# Patient Record
Sex: Female | Born: 1985 | Race: Black or African American | Hispanic: No | Marital: Married | Smoking: Former smoker
Health system: Southern US, Community
[De-identification: ages and names within clinical notes are randomized; demographics above are authoritative.]

## PROBLEM LIST (undated history)

## (undated) ENCOUNTER — Inpatient Hospital Stay (HOSPITAL_COMMUNITY): Payer: Self-pay

## (undated) DIAGNOSIS — Z3490 Encounter for supervision of normal pregnancy, unspecified, unspecified trimester: Principal | ICD-10-CM

## (undated) DIAGNOSIS — I10 Essential (primary) hypertension: Secondary | ICD-10-CM

## (undated) DIAGNOSIS — D649 Anemia, unspecified: Secondary | ICD-10-CM

## (undated) DIAGNOSIS — L709 Acne, unspecified: Secondary | ICD-10-CM

## (undated) DIAGNOSIS — F419 Anxiety disorder, unspecified: Secondary | ICD-10-CM

## (undated) DIAGNOSIS — O139 Gestational [pregnancy-induced] hypertension without significant proteinuria, unspecified trimester: Secondary | ICD-10-CM

## (undated) DIAGNOSIS — M797 Fibromyalgia: Secondary | ICD-10-CM

## (undated) DIAGNOSIS — R768 Other specified abnormal immunological findings in serum: Secondary | ICD-10-CM

## (undated) DIAGNOSIS — N390 Urinary tract infection, site not specified: Secondary | ICD-10-CM

## (undated) DIAGNOSIS — R7689 Other specified abnormal immunological findings in serum: Secondary | ICD-10-CM

## (undated) DIAGNOSIS — Z309 Encounter for contraceptive management, unspecified: Secondary | ICD-10-CM

## (undated) DIAGNOSIS — N83209 Unspecified ovarian cyst, unspecified side: Secondary | ICD-10-CM

## (undated) DIAGNOSIS — L509 Urticaria, unspecified: Secondary | ICD-10-CM

## (undated) HISTORY — DX: Acne, unspecified: L70.9

## (undated) HISTORY — DX: Fibromyalgia: M79.7

## (undated) HISTORY — DX: Urticaria, unspecified: L50.9

## (undated) HISTORY — PX: TONSILLECTOMY: SUR1361

## (undated) HISTORY — DX: Other specified abnormal immunological findings in serum: R76.89

## (undated) HISTORY — DX: Encounter for contraceptive management, unspecified: Z30.9

## (undated) HISTORY — DX: Anxiety disorder, unspecified: F41.9

## (undated) HISTORY — DX: Gestational (pregnancy-induced) hypertension without significant proteinuria, unspecified trimester: O13.9

## (undated) HISTORY — DX: Other specified abnormal immunological findings in serum: R76.8

## (undated) HISTORY — DX: Encounter for supervision of normal pregnancy, unspecified, unspecified trimester: Z34.90

---
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
## 2006-05-08 ENCOUNTER — Emergency Department (HOSPITAL_COMMUNITY): Admission: EM | Admit: 2006-05-08 | Discharge: 2006-05-08 | Payer: Self-pay | Admitting: Emergency Medicine

## 2006-05-11 ENCOUNTER — Emergency Department (HOSPITAL_COMMUNITY): Admission: EM | Admit: 2006-05-11 | Discharge: 2006-05-11 | Payer: Self-pay | Admitting: Emergency Medicine

## 2006-06-24 ENCOUNTER — Emergency Department (HOSPITAL_COMMUNITY): Admission: EM | Admit: 2006-06-24 | Discharge: 2006-06-24 | Payer: Self-pay | Admitting: Emergency Medicine

## 2006-12-08 ENCOUNTER — Emergency Department (HOSPITAL_COMMUNITY): Admission: EM | Admit: 2006-12-08 | Discharge: 2006-12-08 | Payer: Self-pay | Admitting: Emergency Medicine

## 2007-08-18 ENCOUNTER — Other Ambulatory Visit: Admission: RE | Admit: 2007-08-18 | Discharge: 2007-08-18 | Payer: Self-pay | Admitting: Obstetrics and Gynecology

## 2007-10-21 ENCOUNTER — Emergency Department (HOSPITAL_COMMUNITY): Admission: EM | Admit: 2007-10-21 | Discharge: 2007-10-21 | Payer: Self-pay | Admitting: Emergency Medicine

## 2007-12-15 ENCOUNTER — Inpatient Hospital Stay (HOSPITAL_COMMUNITY): Admission: AD | Admit: 2007-12-15 | Discharge: 2007-12-15 | Payer: Self-pay | Admitting: Obstetrics & Gynecology

## 2007-12-15 ENCOUNTER — Ambulatory Visit: Payer: Self-pay | Admitting: Obstetrics & Gynecology

## 2008-02-01 ENCOUNTER — Ambulatory Visit: Payer: Self-pay | Admitting: Family

## 2008-02-01 ENCOUNTER — Inpatient Hospital Stay (HOSPITAL_COMMUNITY): Admission: AD | Admit: 2008-02-01 | Discharge: 2008-02-03 | Payer: Self-pay | Admitting: Obstetrics & Gynecology

## 2010-02-21 ENCOUNTER — Inpatient Hospital Stay (HOSPITAL_COMMUNITY): Admission: AD | Admit: 2010-02-21 | Discharge: 2010-02-21 | Payer: Self-pay | Admitting: Obstetrics and Gynecology

## 2010-07-24 LAB — URIC ACID: Uric Acid, Serum: 5.7 mg/dL (ref 2.4–7.0)

## 2010-07-24 LAB — DIFFERENTIAL
Basophils Absolute: 0.1 10*3/uL (ref 0.0–0.1)
Eosinophils Absolute: 0.1 10*3/uL (ref 0.0–0.7)
Lymphocytes Relative: 23 % (ref 12–46)
Monocytes Relative: 6 % (ref 3–12)

## 2010-07-24 LAB — URINALYSIS, ROUTINE W REFLEX MICROSCOPIC
Hgb urine dipstick: NEGATIVE
Ketones, ur: NEGATIVE mg/dL
Nitrite: NEGATIVE
Protein, ur: NEGATIVE mg/dL
Specific Gravity, Urine: 1.015 (ref 1.005–1.030)
pH: 7 (ref 5.0–8.0)

## 2010-07-24 LAB — CBC
HCT: 35.5 % — ABNORMAL LOW (ref 36.0–46.0)
Hemoglobin: 12.1 g/dL (ref 12.0–15.0)
WBC: 10 10*3/uL (ref 4.0–10.5)

## 2010-07-24 LAB — COMPREHENSIVE METABOLIC PANEL
Albumin: 3.1 g/dL — ABNORMAL LOW (ref 3.5–5.2)
Alkaline Phosphatase: 79 U/L (ref 39–117)
CO2: 21 mEq/L (ref 19–32)
Calcium: 8.6 mg/dL (ref 8.4–10.5)
Creatinine, Ser: 0.82 mg/dL (ref 0.4–1.2)
Glucose, Bld: 81 mg/dL (ref 70–99)
Total Protein: 6.7 g/dL (ref 6.0–8.3)

## 2010-07-24 LAB — LACTATE DEHYDROGENASE: LDH: 158 U/L (ref 94–250)

## 2011-02-05 LAB — URINALYSIS, ROUTINE W REFLEX MICROSCOPIC
Glucose, UA: NEGATIVE
Nitrite: NEGATIVE
Protein, ur: NEGATIVE
Urobilinogen, UA: 0.2
pH: 7

## 2011-02-05 LAB — URINE MICROSCOPIC-ADD ON

## 2011-02-06 LAB — WET PREP, GENITAL: Trich, Wet Prep: NONE SEEN

## 2011-02-09 LAB — RPR: RPR Ser Ql: NONREACTIVE

## 2011-02-09 LAB — CBC
Hemoglobin: 9.6 — ABNORMAL LOW
MCV: 93.4
MCV: 94.4
Platelets: 113 — ABNORMAL LOW
RBC: 3.08 — ABNORMAL LOW
RBC: 3.54 — ABNORMAL LOW
WBC: 10.7 — ABNORMAL HIGH

## 2012-08-30 ENCOUNTER — Encounter

## 2012-09-01 ENCOUNTER — Encounter

## 2012-11-17 ENCOUNTER — Ambulatory Visit (INDEPENDENT_AMBULATORY_CARE_PROVIDER_SITE_OTHER): Admitting: Otolaryngology

## 2012-11-17 DIAGNOSIS — J343 Hypertrophy of nasal turbinates: Secondary | ICD-10-CM

## 2012-11-17 DIAGNOSIS — H6500 Acute serous otitis media, unspecified ear: Secondary | ICD-10-CM

## 2012-11-17 DIAGNOSIS — H902 Conductive hearing loss, unspecified: Secondary | ICD-10-CM

## 2012-11-17 DIAGNOSIS — J310 Chronic rhinitis: Secondary | ICD-10-CM

## 2012-12-22 ENCOUNTER — Ambulatory Visit (INDEPENDENT_AMBULATORY_CARE_PROVIDER_SITE_OTHER): Admitting: Otolaryngology

## 2012-12-24 ENCOUNTER — Encounter (HOSPITAL_COMMUNITY): Payer: Self-pay | Admitting: *Deleted

## 2012-12-24 ENCOUNTER — Emergency Department (HOSPITAL_COMMUNITY)
Admission: EM | Admit: 2012-12-24 | Discharge: 2012-12-24 | Disposition: A | Discharge status: Home / Self Care | Attending: Emergency Medicine | Admitting: Emergency Medicine

## 2012-12-24 DIAGNOSIS — T63461A Toxic effect of venom of wasps, accidental (unintentional), initial encounter: Secondary | ICD-10-CM | POA: Insufficient documentation

## 2012-12-24 DIAGNOSIS — Y92009 Unspecified place in unspecified non-institutional (private) residence as the place of occurrence of the external cause: Secondary | ICD-10-CM | POA: Insufficient documentation

## 2012-12-24 DIAGNOSIS — T6391XA Toxic effect of contact with unspecified venomous animal, accidental (unintentional), initial encounter: Secondary | ICD-10-CM | POA: Insufficient documentation

## 2012-12-24 DIAGNOSIS — Y93H2 Activity, gardening and landscaping: Secondary | ICD-10-CM | POA: Insufficient documentation

## 2012-12-24 MED ORDER — DIPHENHYDRAMINE HCL 25 MG PO CAPS: 25.0000 mg | ORAL_CAPSULE | Freq: Four times a day (QID) | ORAL | Status: DC | PRN

## 2012-12-24 MED ORDER — DIPHENHYDRAMINE HCL 25 MG PO CAPS
ORAL_CAPSULE | ORAL | Status: AC
  Filled 2012-12-24: qty 2

## 2012-12-24 MED ORDER — DIPHENHYDRAMINE HCL 25 MG PO CAPS: 50.0000 mg | ORAL_CAPSULE | Freq: Once | ORAL | Status: DC

## 2012-12-24 MED ORDER — IBUPROFEN 600 MG PO TABS: 600.0000 mg | ORAL_TABLET | Freq: Four times a day (QID) | ORAL | Status: DC | PRN

## 2012-12-24 NOTE — ED Notes (Addendum)
Pt was mowing the yard and felt something bite her on the back of her right calf. Pt states she swated the insect off and it fell to the ground.

## 2012-12-24 NOTE — ED Notes (Signed)
Benadryl given to Dominique Garza to dispense to pt for home use.

## 2012-12-28 NOTE — ED Provider Notes (Signed)
CSN: [id number]     Arrival date & time 12/24/12  2101 History     First MD Initiated Contact with Patient 12/24/12 2128     Chief Complaint  Patient presents with  . Insect Bite   (Consider location/radiation/quality/duration/timing/severity/associated sxs/prior Treatment) HPI Comments: KARAGAN LEHR is a 27 y.o. Female presenting with an insect bite to her right posterior calf while mowing the lawn yesterday.  She describes swatting the insect,  Did not see it but briefly saw a flash of yellow,  So suspects a bee or yellow jacket sting.  She has persistent pain and swelling but also itching at the site.  She has been scratching the site which gives temporary relief and has also used an ice pack but has persistent symptoms which has not progressed.  She denies shortness of breath, cough, radiation of pain and does not have a history of bee sting allergy.       The history is provided by the patient.    History reviewed. No pertinent past medical history. Past Surgical History  Procedure Laterality Date  . Tonsillectomy     History reviewed. No pertinent family history. History  Substance Use Topics  . Smoking status: Never Smoker   . Smokeless tobacco: Not on file  . Alcohol Use: Yes   OB History   Grav Para Term Preterm Abortions TAB SAB Ect Mult Living                 Review of Systems  Constitutional: Negative for fever and chills.  HENT: Negative for sore throat, facial swelling and trouble swallowing.   Respiratory: Negative for shortness of breath and wheezing.   Skin: Positive for wound. Negative for color change.  Neurological: Negative for numbness.    Allergies  Review of patient's allergies indicates no known allergies.  Home Medications   Current Outpatient Rx  Name  Route  Sig  Dispense  Refill  . diphenhydrAMINE (BENADRYL) 25 mg capsule   Oral   Take 1-2 capsules (25-50 mg total) by mouth every 6 (six) hours as needed for itching.   30 capsule    0   . ibuprofen (ADVIL,MOTRIN) 600 MG tablet   Oral   Take 1 tablet (600 mg total) by mouth every 6 (six) hours as needed for pain.   20 tablet   0    BP 138/76  Pulse 66  Temp(Src) 98.4 F (36.9 C) (Oral)  Resp 18  Ht 5' 4.5" (1.638 m)  Wt 210 lb (95.255 kg)  BMI 35.5 kg/m2  SpO2 100% Physical Exam  Constitutional: She appears well-developed and well-nourished. No distress.  HENT:  Head: Normocephalic.  Neck: Neck supple.  Cardiovascular: Normal rate.   Pulmonary/Chest: Effort normal. She has no wheezes.  Musculoskeletal: Normal range of motion. She exhibits no edema.  Skin: There is erythema.  Localized erythema and induration of 3 cm diameter surrounding small puncture,  No fb retained.  No red streaking,  Drainage or drainage.    ED Course   Procedures (including critical care time)  Labs Reviewed - No data to display No results found. 1. Insect sting, initial encounter     MDM  Exam c/w localized reaction to insect sting,  Suspect yellow jacket.  She was encouraged to continue with ice , elevation,  ibprofen and benadryl added.  Expect prn f/u.  The patient appears reasonably screened and/or stabilized for discharge and I doubt any other medical condition or other [facility] requiring further  screening, evaluation, or treatment in the ED at this time prior to discharge.   Burgess Amor, PA-C 12/29/12 1440

## 2012-12-29 NOTE — ED Provider Notes (Signed)
Medical screening examination/treatment/procedure(s) were performed by non-physician practitioner and as supervising physician I was immediately available for consultation/collaboration.  Donnetta Hutching, MD 12/29/12 [phone]

## 2013-07-19 ENCOUNTER — Ambulatory Visit: Admitting: Obstetrics & Gynecology

## 2013-07-20 ENCOUNTER — Ambulatory Visit: Admitting: Obstetrics & Gynecology

## 2013-07-24 ENCOUNTER — Encounter: Payer: Self-pay | Admitting: Obstetrics & Gynecology

## 2013-07-24 ENCOUNTER — Encounter (INDEPENDENT_AMBULATORY_CARE_PROVIDER_SITE_OTHER): Payer: Self-pay

## 2013-07-24 ENCOUNTER — Ambulatory Visit (INDEPENDENT_AMBULATORY_CARE_PROVIDER_SITE_OTHER): Admitting: Obstetrics & Gynecology

## 2013-07-24 VITALS — BP 120/80 | Ht 64.0 in | Wt 191.0 lb

## 2013-07-24 DIAGNOSIS — Z7251 High risk heterosexual behavior: Secondary | ICD-10-CM | POA: Insufficient documentation

## 2013-07-24 DIAGNOSIS — Z113 Encounter for screening for infections with a predominantly sexual mode of transmission: Secondary | ICD-10-CM

## 2013-07-24 NOTE — Addendum Note (Signed)
Addended by: Criss AlvinePULLIAM, Germain Koopmann G on: 07/24/2013 02:14 PM   Modules accepted: Orders

## 2013-07-24 NOTE — Progress Notes (Signed)
Subjective:     Patient ID: Dominique Garza, female   DOB: [birth year], 28 y.o.   MRN: [id number]  HPI Pt presents wanting to be checked for STDs. No specific issues or problems No discharge  Review of Systems Negative    Objective:   Physical Exam normal    Assessment:     STD check due to high risk sexual behavoir     Plan:     Check l;abs

## 2013-07-25 LAB — HIV ANTIBODY (ROUTINE TESTING W REFLEX): HIV: NONREACTIVE

## 2013-07-25 LAB — GC/CHLAMYDIA PROBE AMP
CT PROBE, AMP APTIMA: NEGATIVE
GC PROBE AMP APTIMA: NEGATIVE

## 2013-07-25 LAB — HEPATITIS C ANTIBODY: HCV Ab: NEGATIVE

## 2013-07-25 LAB — RPR

## 2013-07-25 LAB — HSV 2 ANTIBODY, IGG: HSV 2 GLYCOPROTEIN G AB, IGG: 7.92 IV — AB

## 2013-07-31 ENCOUNTER — Telehealth: Payer: Self-pay | Admitting: Obstetrics and Gynecology

## 2013-08-03 ENCOUNTER — Telehealth: Payer: Self-pay | Admitting: Obstetrics and Gynecology

## 2013-08-03 NOTE — Telephone Encounter (Signed)
Pt informed (labs from 07/24/2012) HSV 2 positive, all other test results negative. Pt states had positive pregnancy test call transferred to front staff for an appt.

## 2013-08-04 ENCOUNTER — Encounter: Payer: Self-pay | Admitting: Adult Health

## 2013-08-04 ENCOUNTER — Ambulatory Visit (INDEPENDENT_AMBULATORY_CARE_PROVIDER_SITE_OTHER): Admitting: Adult Health

## 2013-08-04 ENCOUNTER — Telehealth: Payer: Self-pay | Admitting: Adult Health

## 2013-08-04 VITALS — BP 124/80 | Ht 64.0 in | Wt 190.0 lb

## 2013-08-04 DIAGNOSIS — Z3201 Encounter for pregnancy test, result positive: Secondary | ICD-10-CM

## 2013-08-04 LAB — POCT URINE PREGNANCY: Preg Test, Ur: POSITIVE

## 2013-08-04 LAB — HCG, QUANTITATIVE, PREGNANCY: hCG, Beta Chain, Quant, S: 30.5 m[IU]/mL

## 2013-08-04 NOTE — Telephone Encounter (Signed)
Pt aware QHCG 30.5 will repeat Monday 3/30 at 10 am

## 2013-08-04 NOTE — Progress Notes (Signed)
Patient ID: Dominique SalesJanay J Polito, female   DOB: 28-Dec-1985, 28 y.o.   MRN: [id number] Pt here for pregnancy test, resulted positive, LMP 07/24/2013 per patient. Pt c/o sharp right sided pain. Spoke with Cyril MourningJennifer Griffin, [facility] will f/u with pt later today.

## 2013-08-07 ENCOUNTER — Other Ambulatory Visit

## 2013-08-07 DIAGNOSIS — Z3202 Encounter for pregnancy test, result negative: Secondary | ICD-10-CM

## 2013-08-08 ENCOUNTER — Telehealth: Payer: Self-pay | Admitting: Obstetrics and Gynecology

## 2013-08-08 LAB — HCG, QUANTITATIVE, PREGNANCY: HCG, BETA CHAIN, QUANT, S: 31.2 m[IU]/mL

## 2013-08-08 NOTE — Telephone Encounter (Signed)
Pt informed of QHCG results of 31.2, pt informed QHCG did not double will discuss results with provider and contact pt with plan.

## 2013-08-08 NOTE — Telephone Encounter (Signed)
I called pt and she is aware of results and will have HCG re checked next week

## 2013-08-21 ENCOUNTER — Encounter: Payer: Self-pay | Admitting: Obstetrics & Gynecology

## 2013-08-21 ENCOUNTER — Other Ambulatory Visit

## 2013-08-21 ENCOUNTER — Ambulatory Visit (INDEPENDENT_AMBULATORY_CARE_PROVIDER_SITE_OTHER): Admitting: Obstetrics & Gynecology

## 2013-08-21 VITALS — BP 130/80 | Ht 64.2 in | Wt 185.0 lb

## 2013-08-21 DIAGNOSIS — Z3201 Encounter for pregnancy test, result positive: Secondary | ICD-10-CM

## 2013-08-21 DIAGNOSIS — O200 Threatened abortion: Secondary | ICD-10-CM | POA: Insufficient documentation

## 2013-08-21 NOTE — Progress Notes (Signed)
Patient ID: Dominique SalesJanay J Garza, female   DOB: 01/28/1986, 28 y.o.   MRN: [id number] Quantitative hcg done today Follow up based on lab work  Past Medical History  Diagnosis Date  . Fibromyalgia     Past Surgical History  Procedure Laterality Date  . Tonsillectomy      OB History   Grav Para Term Preterm Abortions TAB SAB Ect Mult Living                  No Known Allergies  History   Social History  . Marital Status: Married    Spouse Name: N/A    Number of Children: N/A  . Years of Education: N/A   Social History Main Topics  . Smoking status: Never Smoker   . Smokeless tobacco: Never Used  . Alcohol Use: Yes     Comment: occassionally  . Drug Use: No  . Sexual Activity: Yes   Other Topics Concern  . None   Social History Narrative  . None    Family History  Problem Relation Age of Onset  . Fibromyalgia Mother   . Sarcoidosis Mother   . Hypertension Sister   . Diabetes Paternal Grandmother

## 2013-08-22 LAB — HCG, QUANTITATIVE, PREGNANCY: hCG, Beta Chain, Quant, S: 4.5 m[IU]/mL

## 2013-08-23 ENCOUNTER — Telehealth: Payer: Self-pay | Admitting: Adult Health

## 2013-08-23 NOTE — Telephone Encounter (Signed)
Pt aware of results. And to make appointment when needed.

## 2014-02-16 ENCOUNTER — Ambulatory Visit (INDEPENDENT_AMBULATORY_CARE_PROVIDER_SITE_OTHER): Admitting: Adult Health

## 2014-02-16 ENCOUNTER — Encounter: Payer: Self-pay | Admitting: Adult Health

## 2014-02-16 VITALS — BP 134/90 | Ht 64.5 in | Wt 193.5 lb

## 2014-02-16 DIAGNOSIS — Z3201 Encounter for pregnancy test, result positive: Secondary | ICD-10-CM

## 2014-02-16 DIAGNOSIS — Z3490 Encounter for supervision of normal pregnancy, unspecified, unspecified trimester: Secondary | ICD-10-CM

## 2014-02-16 HISTORY — DX: Encounter for supervision of normal pregnancy, unspecified, unspecified trimester: Z34.90

## 2014-02-16 LAB — POCT URINE PREGNANCY: Preg Test, Ur: POSITIVE

## 2014-02-16 NOTE — Patient Instructions (Signed)
First Trimester of Pregnancy The first trimester of pregnancy is from week 1 until the end of week 12 (months 1 through 3). A week after a sperm fertilizes an egg, the egg will implant on the wall of the uterus. This embryo will begin to develop into a baby. Genes from you and your partner are forming the baby. The female genes determine whether the baby is a boy or a girl. At 6-8 weeks, the eyes and face are formed, and the heartbeat can be seen on ultrasound. At the end of 12 weeks, all the baby's organs are formed.  Now that you are pregnant, you will want to do everything you can to have a healthy baby. Two of the most important things are to get good prenatal care and to follow your health care provider's instructions. Prenatal care is all the medical care you receive before the baby's birth. This care will help prevent, find, and treat any problems during the pregnancy and childbirth. BODY CHANGES Your body goes through many changes during pregnancy. The changes vary from woman to woman.   You may gain or lose a couple of pounds at first.  You may feel sick to your stomach (nauseous) and throw up (vomit). If the vomiting is uncontrollable, call your health care provider.  You may tire easily.  You may develop headaches that can be relieved by medicines approved by your health care provider.  You may urinate more often. Painful urination may mean you have a bladder infection.  You may develop heartburn as a result of your pregnancy.  You may develop constipation because certain hormones are causing the muscles that push waste through your intestines to slow down.  You may develop hemorrhoids or swollen, bulging veins (varicose veins).  Your breasts may begin to grow larger and become tender. Your nipples may stick out more, and the tissue that surrounds them (areola) may become darker.  Your gums may bleed and may be sensitive to brushing and flossing.  Dark spots or blotches (chloasma,  mask of pregnancy) may develop on your face. This will likely fade after the baby is born.  Your menstrual periods will stop.  You may have a loss of appetite.  You may develop cravings for certain kinds of food.  You may have changes in your emotions from day to day, such as being excited to be pregnant or being concerned that something may go wrong with the pregnancy and baby.  You may have more vivid and strange dreams.  You may have changes in your hair. These can include thickening of your hair, rapid growth, and changes in texture. Some women also have hair loss during or after pregnancy, or hair that feels dry or thin. Your hair will most likely return to normal after your baby is born. WHAT TO EXPECT AT YOUR PRENATAL VISITS During a routine prenatal visit:  You will be weighed to make sure you and the baby are growing normally.  Your blood pressure will be taken.  Your abdomen will be measured to track your baby's growth.  The fetal heartbeat will be listened to starting around week 10 or 12 of your pregnancy.  Test results from any previous visits will be discussed. Your health care provider may ask you:  How you are feeling.  If you are feeling the baby move.  If you have had any abnormal symptoms, such as leaking fluid, bleeding, severe headaches, or abdominal cramping.  If you have any questions. Other tests   that may be performed during your first trimester include:  Blood tests to find your blood type and to check for the presence of any previous infections. They will also be used to check for low iron levels (anemia) and Rh antibodies. Later in the pregnancy, blood tests for diabetes will be done along with other tests if problems develop.  Urine tests to check for infections, diabetes, or protein in the urine.  An ultrasound to confirm the proper growth and development of the baby.  An amniocentesis to check for possible genetic problems.  Fetal screens for  spina bifida and Down syndrome.  You may need other tests to make sure you and the baby are doing well. HOME CARE INSTRUCTIONS  Medicines  Follow your health care provider's instructions regarding medicine use. Specific medicines may be either safe or unsafe to take during pregnancy.  Take your prenatal vitamins as directed.  If you develop constipation, try taking a stool softener if your health care provider approves. Diet  Eat regular, well-balanced meals. Choose a variety of foods, such as meat or vegetable-based protein, fish, milk and low-fat dairy products, vegetables, fruits, and whole grain breads and cereals. Your health care provider will help you determine the amount of weight gain that is right for you.  Avoid raw meat and uncooked cheese. These carry germs that can cause birth defects in the baby.  Eating four or five small meals rather than three large meals a day may help relieve nausea and vomiting. If you start to feel nauseous, eating a few soda crackers can be helpful. Drinking liquids between meals instead of during meals also seems to help nausea and vomiting.  If you develop constipation, eat more high-fiber foods, such as fresh vegetables or fruit and whole grains. Drink enough fluids to keep your urine clear or pale yellow. Activity and Exercise  Exercise only as directed by your health care provider. Exercising will help you:  Control your weight.  Stay in shape.  Be prepared for labor and delivery.  Experiencing pain or cramping in the lower abdomen or low back is a good sign that you should stop exercising. Check with your health care provider before continuing normal exercises.  Try to avoid standing for long periods of time. Move your legs often if you must stand in one place for a long time.  Avoid heavy lifting.  Wear low-heeled shoes, and practice good posture.  You may continue to have sex unless your health care provider directs you  otherwise. Relief of Pain or Discomfort  Wear a good support bra for breast tenderness.   Take warm sitz baths to soothe any pain or discomfort caused by hemorrhoids. Use hemorrhoid cream if your health care provider approves.   Rest with your legs elevated if you have leg cramps or low back pain.  If you develop varicose veins in your legs, wear support hose. Elevate your feet for 15 minutes, 3-4 times a day. Limit salt in your diet. Prenatal Care  Schedule your prenatal visits by the twelfth week of pregnancy. They are usually scheduled monthly at first, then more often in the last 2 months before delivery.  Write down your questions. Take them to your prenatal visits.  Keep all your prenatal visits as directed by your health care provider. Safety  Wear your seat belt at all times when driving.  Make a list of emergency phone numbers, including numbers for family, friends, the hospital, and police and fire departments. General Tips    Ask your health care provider for a referral to a local prenatal education class. Begin classes no later than at the beginning of month 6 of your pregnancy.  Ask for help if you have counseling or nutritional needs during pregnancy. Your health care provider can offer advice or refer you to specialists for help with various needs.  Do not use hot tubs, steam rooms, or saunas.  Do not douche or use tampons or scented sanitary pads.  Do not cross your legs for long periods of time.  Avoid cat litter boxes and soil used by cats. These carry germs that can cause birth defects in the baby and possibly loss of the fetus by miscarriage or stillbirth.  Avoid all smoking, herbs, alcohol, and medicines not prescribed by your health care provider. Chemicals in these affect the formation and growth of the baby.  Schedule a dentist appointment. At home, brush your teeth with a soft toothbrush and be gentle when you floss. SEEK MEDICAL CARE IF:   You have  dizziness.  You have mild pelvic cramps, pelvic pressure, or nagging pain in the abdominal area.  You have persistent nausea, vomiting, or diarrhea.  You have a bad smelling vaginal discharge.  You have pain with urination.  You notice increased swelling in your face, hands, legs, or ankles. SEEK IMMEDIATE MEDICAL CARE IF:   You have a fever.  You are leaking fluid from your vagina.  You have spotting or bleeding from your vagina.  You have severe abdominal cramping or pain.  You have rapid weight gain or loss.  You vomit blood or material that looks like coffee grounds.  You are exposed to [place] measles and have never had them.  You are exposed to fifth disease or chickenpox.  You develop a severe headache.  You have shortness of breath.  You have any kind of trauma, such as from a fall or a car accident. Document Released: 04/21/2001 Document Revised: 09/11/2013 Document Reviewed: 03/07/2013 [facility]CExitCare Patient Information 2015 [place], [place]. This information is not intended to replace advice given to you by your health care provider. Make sure you discuss any questions you have with your health care provider. Get medicaid Return in 1 week for dating [place]

## 2014-02-16 NOTE — Progress Notes (Signed)
Subjective:     Patient ID: Dominique Garza, female   DOB: 1986-03-19, 28 y.o.   MRN: [id number]  HPI Dominique Garza is a 28 year old black female in for UPT, no period since March.Complains of headache at times and back pain, no bleeding.  Review of Systems See HPI Reviewed past medical,surgical, social and family history. Reviewed medications and allergies.     Objective:   Physical Exam BP 134/90  Ht 5' 4.5" (1.638 m)  Wt 193 lb 8 oz (87.771 kg)  BMI 32.71 kg/m2  LMP 03/16/2015UPT+, US shows 2.3 cm fetal pole with +FHM so about 9 weeks with EDD 09/23/14, form given to get medicaid     Assessment:    Pregnant +UPT    Plan:    Increase water and try tylenol Return in 1 week for dating [place]  Get pregnancy medicaid Take prenatals   Review handout on first trimester

## 2014-02-22 ENCOUNTER — Other Ambulatory Visit: Payer: Self-pay | Admitting: Adult Health

## 2014-02-22 ENCOUNTER — Ambulatory Visit (INDEPENDENT_AMBULATORY_CARE_PROVIDER_SITE_OTHER)

## 2014-02-22 DIAGNOSIS — O26841 Uterine size-date discrepancy, first trimester: Secondary | ICD-10-CM

## 2014-02-22 DIAGNOSIS — Z3490 Encounter for supervision of normal pregnancy, unspecified, unspecified trimester: Secondary | ICD-10-CM

## 2014-02-22 NOTE — Progress Notes (Signed)
U/S-single IUP with +FCA Noted, FHR-167 bpm, cx appears closed, bilateral adnexa appears WNL, CRL c/w 9+6wks EDD 09/21/2014

## 2014-02-28 ENCOUNTER — Encounter: Admitting: Women's Health

## 2014-03-07 ENCOUNTER — Ambulatory Visit (INDEPENDENT_AMBULATORY_CARE_PROVIDER_SITE_OTHER): Admitting: Women's Health

## 2014-03-07 ENCOUNTER — Encounter: Payer: Self-pay | Admitting: Women's Health

## 2014-03-07 VITALS — BP 126/72 | Wt 197.0 lb

## 2014-03-07 DIAGNOSIS — Z0184 Encounter for antibody response examination: Secondary | ICD-10-CM

## 2014-03-07 DIAGNOSIS — O09299 Supervision of pregnancy with other poor reproductive or obstetric history, unspecified trimester: Secondary | ICD-10-CM | POA: Insufficient documentation

## 2014-03-07 DIAGNOSIS — Z3490 Encounter for supervision of normal pregnancy, unspecified, unspecified trimester: Secondary | ICD-10-CM | POA: Insufficient documentation

## 2014-03-07 DIAGNOSIS — Z0283 Encounter for blood-alcohol and blood-drug test: Secondary | ICD-10-CM

## 2014-03-07 DIAGNOSIS — Z1371 Encounter for nonprocreative screening for genetic disease carrier status: Secondary | ICD-10-CM

## 2014-03-07 DIAGNOSIS — Z3481 Encounter for supervision of other normal pregnancy, first trimester: Secondary | ICD-10-CM

## 2014-03-07 DIAGNOSIS — Z3682 Encounter for antenatal screening for nuchal translucency: Secondary | ICD-10-CM

## 2014-03-07 DIAGNOSIS — Z1389 Encounter for screening for other disorder: Secondary | ICD-10-CM

## 2014-03-07 DIAGNOSIS — R768 Other specified abnormal immunological findings in serum: Secondary | ICD-10-CM | POA: Insufficient documentation

## 2014-03-07 DIAGNOSIS — Z114 Encounter for screening for human immunodeficiency virus [HIV]: Secondary | ICD-10-CM

## 2014-03-07 DIAGNOSIS — Z331 Pregnant state, incidental: Secondary | ICD-10-CM

## 2014-03-07 DIAGNOSIS — Z113 Encounter for screening for infections with a predominantly sexual mode of transmission: Secondary | ICD-10-CM

## 2014-03-07 DIAGNOSIS — O09291 Supervision of pregnancy with other poor reproductive or obstetric history, first trimester: Secondary | ICD-10-CM

## 2014-03-07 DIAGNOSIS — Z130 Encounter for screening for diseases of the blood and blood-forming organs and certain disorders involving the immune mechanism: Secondary | ICD-10-CM

## 2014-03-07 LAB — CBC
HEMATOCRIT: 32.6 % — AB (ref 36.0–46.0)
HEMOGLOBIN: 11.2 g/dL — AB (ref 12.0–15.0)
MCH: 29.9 pg (ref 26.0–34.0)
MCHC: 34.4 g/dL (ref 30.0–36.0)
MCV: 87.2 fL (ref 78.0–100.0)
Platelets: 180 10*3/uL (ref 150–400)
RBC: 3.74 MIL/uL — AB (ref 3.87–5.11)
RDW: 14.2 % (ref 11.5–15.5)
WBC: 10.6 10*3/uL — AB (ref 4.0–10.5)

## 2014-03-07 LAB — POCT URINALYSIS DIPSTICK
Blood, UA: NEGATIVE
GLUCOSE UA: NEGATIVE
KETONES UA: NEGATIVE
LEUKOCYTES UA: NEGATIVE
NITRITE UA: NEGATIVE
Protein, UA: NEGATIVE

## 2014-03-07 NOTE — Patient Instructions (Addendum)
Begin taking a 81mg  baby aspirin daily at 12 weeks of pregnancy to decrease risk of hypertension disorders of pregnancy   Nausea & Vomiting  Have saltine crackers or pretzels by your bed and eat a few bites before you raise your head out of bed in the morning  Eat small frequent meals throughout the day instead of large meals  Drink plenty of fluids throughout the day to stay hydrated, just don't drink a lot of fluids with your meals.  This can make your stomach fill up faster making you feel sick  Do not brush your teeth right after you eat  Products with real ginger are good for nausea, like ginger ale and ginger hard candy Make sure it says made with real ginger!  Sucking on sour candy like lemon heads is also good for nausea  If your prenatal vitamins make you nauseated, take them at night so you will sleep through the nausea  Sea [employer]  If you feel like you need medicine for the nausea & vomiting please let us know  If you are unable to keep any fluids or food down please let us know   First Trimester of Pregnancy The first trimester of pregnancy is from week 1 until the end of week 12 (months 1 through 3). A week after a sperm fertilizes an egg, the egg will implant on the wall of the uterus. This embryo will begin to develop into a baby. Genes from you and your partner are forming the baby. The female genes determine whether the baby is a boy or a girl. At 6-8 weeks, the eyes and face are formed, and the heartbeat can be seen on ultrasound. At the end of 12 weeks, all the baby's organs are formed.  Now that you are pregnant, you will want to do everything you can to have a healthy baby. Two of the most important things are to get good prenatal care and to follow your health care provider's instructions. Prenatal care is all the medical care you receive before the baby's birth. This care will help prevent, find, and treat any problems during the pregnancy and childbirth. BODY  CHANGES Your body goes through many changes during pregnancy. The changes vary from woman to woman.   You may gain or lose a couple of pounds at first.  You may feel sick to your stomach (nauseous) and throw up (vomit). If the vomiting is uncontrollable, call your health care provider.  You may tire easily.  You may develop headaches that can be relieved by medicines approved by your health care provider.  You may urinate more often. Painful urination may mean you have a bladder infection.  You may develop heartburn as a result of your pregnancy.  You may develop constipation because certain hormones are causing the muscles that push waste through your intestines to slow down.  You may develop hemorrhoids or swollen, bulging veins (varicose veins).  Your breasts may begin to grow larger and become tender. Your nipples may stick out more, and the tissue that surrounds them (areola) may become darker.  Your gums may bleed and may be sensitive to brushing and flossing.  Dark spots or blotches (chloasma, mask of pregnancy) may develop on your face. This will likely fade after the baby is born.  Your menstrual periods will stop.  You may have a loss of appetite.  You may develop cravings for certain kinds of food.  You may have changes in your emotions from day  to day, such as being excited to be pregnant or being concerned that something may go wrong with the pregnancy and baby.  You may have more vivid and strange dreams.  You may have changes in your hair. These can include thickening of your hair, rapid growth, and changes in texture. Some women also have hair loss during or after pregnancy, or hair that feels dry or thin. Your hair will most likely return to normal after your baby is born. WHAT TO EXPECT AT YOUR PRENATAL VISITS During a routine prenatal visit:  You will be weighed to make sure you and the baby are growing normally.  Your blood pressure will be taken.  Your  abdomen will be measured to track your baby's growth.  The fetal heartbeat will be listened to starting around week 10 or 12 of your pregnancy.  Test results from any previous visits will be discussed. Your health care provider may ask you:  How you are feeling.  If you are feeling the baby move.  If you have had any abnormal symptoms, such as leaking fluid, bleeding, severe headaches, or abdominal cramping.  If you have any questions. Other tests that may be performed during your first trimester include:  Blood tests to find your blood type and to check for the presence of any previous infections. They will also be used to check for low iron levels (anemia) and Rh antibodies. Later in the pregnancy, blood tests for diabetes will be done along with other tests if problems develop.  Urine tests to check for infections, diabetes, or protein in the urine.  An ultrasound to confirm the proper growth and development of the baby.  An amniocentesis to check for possible genetic problems.  Fetal screens for spina bifida and Down syndrome.  You may need other tests to make sure you and the baby are doing well. HOME CARE INSTRUCTIONS  Medicines  Follow your health care provider's instructions regarding medicine use. Specific medicines may be either safe or unsafe to take during pregnancy.  Take your prenatal vitamins as directed.  If you develop constipation, try taking a stool softener if your health care provider approves. Diet  Eat regular, well-balanced meals. Choose a variety of foods, such as meat or vegetable-based protein, fish, milk and low-fat dairy products, vegetables, fruits, and whole grain breads and cereals. Your health care provider will help you determine the amount of weight gain that is right for you.  Avoid raw meat and uncooked cheese. These carry germs that can cause birth defects in the baby.  Eating four or five small meals rather than three large meals a day  may help relieve nausea and vomiting. If you start to feel nauseous, eating a few soda crackers can be helpful. Drinking liquids between meals instead of during meals also seems to help nausea and vomiting.  If you develop constipation, eat more high-fiber foods, such as fresh vegetables or fruit and whole grains. Drink enough fluids to keep your urine clear or pale yellow. Activity and Exercise  Exercise only as directed by your health care provider. Exercising will help you:  Control your weight.  Stay in shape.  Be prepared for labor and delivery.  Experiencing pain or cramping in the lower abdomen or low back is a good sign that you should stop exercising. Check with your health care provider before continuing normal exercises.  Try to avoid standing for long periods of time. Move your legs often if you must stand in one  place for a long time.  Avoid heavy lifting.  Wear low-heeled shoes, and practice good posture.  You may continue to have sex unless your health care provider directs you otherwise. Relief of Pain or Discomfort  Wear a good support bra for breast tenderness.   Take warm sitz baths to soothe any pain or discomfort caused by hemorrhoids. Use hemorrhoid cream if your health care provider approves.   Rest with your legs elevated if you have leg cramps or low back pain.  If you develop varicose veins in your legs, wear support hose. Elevate your feet for 15 minutes, 3-4 times a day. Limit salt in your diet. Prenatal Care  Schedule your prenatal visits by the twelfth week of pregnancy. They are usually scheduled monthly at first, then more often in the last 2 months before delivery.  Write down your questions. Take them to your prenatal visits.  Keep all your prenatal visits as directed by your health care provider. Safety  Wear your seat belt at all times when driving.  Make a list of emergency phone numbers, including numbers for family, friends, the  hospital, and police and fire departments. General Tips  Ask your health care provider for a referral to a local prenatal education class. Begin classes no later than at the beginning of month 6 of your pregnancy.  Ask for help if you have counseling or nutritional needs during pregnancy. Your health care provider can offer advice or refer you to specialists for help with various needs.  Do not use hot tubs, steam rooms, or saunas.  Do not douche or use tampons or scented sanitary pads.  Do not cross your legs for long periods of time.  Avoid cat litter boxes and soil used by cats. These carry germs that can cause birth defects in the baby and possibly loss of the fetus by miscarriage or stillbirth.  Avoid all smoking, herbs, alcohol, and medicines not prescribed by your health care provider. Chemicals in these affect the formation and growth of the baby.  Schedule a dentist appointment. At home, brush your teeth with a soft toothbrush and be gentle when you floss. SEEK MEDICAL CARE IF:   You have dizziness.  You have mild pelvic cramps, pelvic pressure, or nagging pain in the abdominal area.  You have persistent nausea, vomiting, or diarrhea.  You have a bad smelling vaginal discharge.  You have pain with urination.  You notice increased swelling in your face, hands, legs, or ankles. SEEK IMMEDIATE MEDICAL CARE IF:   You have a fever.  You are leaking fluid from your vagina.  You have spotting or bleeding from your vagina.  You have severe abdominal cramping or pain.  You have rapid weight gain or loss.  You vomit blood or material that looks like coffee grounds.  You are exposed to [place] measles and have never had them.  You are exposed to fifth disease or chickenpox.  You develop a severe headache.  You have shortness of breath.  You have any kind of trauma, such as from a fall or a car accident. Document Released: 04/21/2001 Document Revised: 09/11/2013  Document Reviewed: 03/07/2013 [facility] Patient Information 2015 [place], [place]. This information is not intended to replace advice given to you by your health care provider. Make sure you discuss any questions you have with your health care provider.

## 2014-03-07 NOTE — Progress Notes (Signed)
  Subjective:  Dominique Garza is a 28 y.o. [phone] African American female at 4452w5d by 9wk u/s, being seen today for her first obstetrical visit.  Her obstetrical history is significant for 36wk IOL d/t pre-e, and 2 term uncomplicated SVB, recent SAB in March 2015. 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 Tested pos here in March of this year.  Pregnancy history fully reviewed.  Patient reports no complaints. Denies vb, cramping, uti s/s, abnormal/malodorous vag d/c, or vulvovaginal itching/irritation.  BP 126/72  Wt 197 lb (89.359 kg)  LMP 01/03/2014  HISTORY: OB History  Gravida Para Term Preterm AB SAB TAB Ectopic Multiple Living  5 3 2 1 1 1    3     # Outcome Date GA Lbr Len/2nd Weight Sex Delivery Anes PTL Lv  5 CUR           4 SAB 07/2013          3 TRM 02/15/11 1655w0d  6 lb 6 oz (2.892 kg) M SVD EPI  Y  2 PRE 02/26/10 1534w0d  3 lb 11 oz (1.673 kg) F SVD EPI  Y     Comments: d/t pre-e  1 TRM 02/01/08 4555w0d  5 lb 14 oz (2.665 kg) F SVD EPI  Y     Past Medical History  Diagnosis Date  . Fibromyalgia   . Pregnant 02/16/2014  . Anxiety    Past Surgical History  Procedure Laterality Date  . Tonsillectomy     Family History  Problem Relation Age of Onset  . Fibromyalgia Mother   . Sarcoidosis Mother   . Diabetes Paternal Grandmother   . Hypertension Father     Exam   System:     General: Well developed & nourished, no acute distress   Skin: Warm & dry, normal coloration and turgor, no rashes   Neurologic: Alert & oriented, normal mood   Cardiovascular: Regular rate & rhythm   Respiratory: Effort & rate normal, LCTAB, acyanotic   Abdomen: Soft, non tender   Extremities: normal strength, tone   Pelvic Exam:    Perineum: Normal perineum   Vulva: Normal, no lesions   Vagina:  Normal mucosa, normal discharge   Cervix: Normal, bulbous, appears closed   Uterus: Normal  size/shape/contour for GA   Thin prep pap smear neg 2013 [place] FHR: 155 via doppler   Assessment:   Pregnancy: [id number] Patient Active Problem List   Diagnosis Date Noted  . Supervision of normal pregnancy 03/07/2014    Priority: High  . 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 Threatened abortion, antepartum 08/21/2013    6452w5d [id number] New OB visit H/O pre-e w/ 36wk SVB after IOL Known HSV2 seropositive- no outbreaks    Plan:  Initial labs drawn Continue prenatal vitamins Problem list reviewed and updated Reviewed n/v relief measures and warning s/s to report Reviewed recommended weight gain based on pre-gravid BMI Encouraged well-balanced diet Genetic Screening discussed Integrated Screen: requested Cystic fibrosis screening discussed requested Ultrasound discussed; fetal survey: requested Follow up in 1 weeks for 1st it/nt (no visit), then 4wks for 2nd IT and visit [facility] completed  Dominique Garza, Dominique Garza CNM, [facility]NP-BC 03/07/2014 2:54 PM

## 2014-03-08 LAB — CYSTIC FIBROSIS DIAGNOSTIC STUDY

## 2014-03-08 LAB — URINALYSIS, ROUTINE W REFLEX MICROSCOPIC
BILIRUBIN URINE: NEGATIVE
Glucose, UA: NEGATIVE mg/dL
HGB URINE DIPSTICK: NEGATIVE
Ketones, ur: NEGATIVE mg/dL
Leukocytes, UA: NEGATIVE
Nitrite: NEGATIVE
PROTEIN: NEGATIVE mg/dL
Specific Gravity, Urine: 1.024 (ref 1.005–1.030)
UROBILINOGEN UA: 1 mg/dL (ref 0.0–1.0)
pH: 6 (ref 5.0–8.0)

## 2014-03-08 LAB — DRUG SCREEN, URINE, NO CONFIRMATION
AMPHETAMINE SCRN UR: NEGATIVE
Barbiturate Quant, Ur: NEGATIVE
Benzodiazepines.: NEGATIVE
COCAINE METABOLITES: NEGATIVE
CREATININE, U: 210.3 mg/dL
Marijuana Metabolite: NEGATIVE
Methadone: NEGATIVE
OPIATE SCREEN, URINE: NEGATIVE
PHENCYCLIDINE (PCP): NEGATIVE
Propoxyphene: NEGATIVE

## 2014-03-08 LAB — SICKLE CELL SCREEN: Sickle Cell Screen: NEGATIVE

## 2014-03-08 LAB — HIV ANTIBODY (ROUTINE TESTING W REFLEX): HIV: NONREACTIVE

## 2014-03-08 LAB — URINE CULTURE
COLONY COUNT: NO GROWTH
Organism ID, Bacteria: NO GROWTH

## 2014-03-08 LAB — VARICELLA ZOSTER ANTIBODY, IGG: VARICELLA IGG: 1800 {index} — AB (ref <135.00)

## 2014-03-08 LAB — HEPATITIS B SURFACE ANTIGEN: Hepatitis B Surface Ag: NEGATIVE

## 2014-03-08 LAB — ABO AND RH: Rh Type: POSITIVE

## 2014-03-08 LAB — ANTIBODY SCREEN: Antibody Screen: NEGATIVE

## 2014-03-08 LAB — RPR

## 2014-03-08 LAB — OXYCODONE SCREEN, UA, RFLX CONFIRM: Oxycodone Screen, Ur: NEGATIVE ng/mL

## 2014-03-08 LAB — RUBELLA SCREEN: Rubella: 5.28 Index — ABNORMAL HIGH (ref <0.90)

## 2014-03-12 ENCOUNTER — Encounter: Payer: Self-pay | Admitting: Women's Health

## 2014-03-14 ENCOUNTER — Other Ambulatory Visit

## 2014-03-19 ENCOUNTER — Ambulatory Visit (INDEPENDENT_AMBULATORY_CARE_PROVIDER_SITE_OTHER)

## 2014-03-19 ENCOUNTER — Other Ambulatory Visit

## 2014-03-19 DIAGNOSIS — Z331 Pregnant state, incidental: Secondary | ICD-10-CM

## 2014-03-19 DIAGNOSIS — Z3682 Encounter for antenatal screening for nuchal translucency: Secondary | ICD-10-CM

## 2014-03-19 DIAGNOSIS — Z36 Encounter for antenatal screening of mother: Secondary | ICD-10-CM

## 2014-03-19 NOTE — Progress Notes (Signed)
U/S(13+3wks)-single IUP with +FCA noted, FHR-147 bpm, cx appears closed, bilateral adnexa appears WNL, Rt Lateral Gr0 placenta, CRL c/w previous u/s dates, NB present, NT-1.6375mm

## 2014-03-23 LAB — MATERNAL SCREEN, INTEGRATED #1

## 2014-03-31 ENCOUNTER — Emergency Department (HOSPITAL_COMMUNITY)

## 2014-03-31 ENCOUNTER — Emergency Department (HOSPITAL_COMMUNITY)
Admission: EM | Admit: 2014-03-31 | Discharge: 2014-03-31 | Disposition: A | Discharge status: Home / Self Care | Attending: Emergency Medicine | Admitting: Emergency Medicine

## 2014-03-31 DIAGNOSIS — Z7982 Long term (current) use of aspirin: Secondary | ICD-10-CM | POA: Diagnosis not present

## 2014-03-31 DIAGNOSIS — Z8659 Personal history of other mental and behavioral disorders: Secondary | ICD-10-CM | POA: Diagnosis not present

## 2014-03-31 DIAGNOSIS — Z3A15 15 weeks gestation of pregnancy: Secondary | ICD-10-CM | POA: Diagnosis not present

## 2014-03-31 DIAGNOSIS — O9989 Other specified diseases and conditions complicating pregnancy, childbirth and the puerperium: Secondary | ICD-10-CM | POA: Diagnosis present

## 2014-03-31 DIAGNOSIS — R110 Nausea: Secondary | ICD-10-CM | POA: Insufficient documentation

## 2014-03-31 DIAGNOSIS — O26899 Other specified pregnancy related conditions, unspecified trimester: Secondary | ICD-10-CM

## 2014-03-31 DIAGNOSIS — R42 Dizziness and giddiness: Secondary | ICD-10-CM | POA: Insufficient documentation

## 2014-03-31 DIAGNOSIS — R109 Unspecified abdominal pain: Secondary | ICD-10-CM | POA: Diagnosis not present

## 2014-03-31 LAB — WET PREP, GENITAL
TRICH WET PREP: NONE SEEN
Yeast Wet Prep HPF POC: NONE SEEN

## 2014-03-31 LAB — URINALYSIS, ROUTINE W REFLEX MICROSCOPIC
Bilirubin Urine: NEGATIVE
GLUCOSE, UA: NEGATIVE mg/dL
Hgb urine dipstick: NEGATIVE
KETONES UR: NEGATIVE mg/dL
Leukocytes, UA: NEGATIVE
Nitrite: NEGATIVE
PH: 6 (ref 5.0–8.0)
PROTEIN: NEGATIVE mg/dL
Specific Gravity, Urine: 1.025 (ref 1.005–1.030)
Urobilinogen, UA: 0.2 mg/dL (ref 0.0–1.0)

## 2014-03-31 NOTE — ED Notes (Signed)
Pt reports abd cramping that started last night, pt is [redacted] weeks pregnant, has received prenatal care. Pt denies any vaginal bleedings

## 2014-03-31 NOTE — ED Notes (Signed)
Pt reports this is 5th pregnancy with 3 live births and one miscarriage.

## 2014-03-31 NOTE — ED Provider Notes (Signed)
CSN: [id number]     Arrival date & time 03/31/14  0902 History   This chart was scribed for No att. providers found by [facility] Institute[facility] Abu Hashem, ED Scribe. The patient was seen in APA07/[facility]07 and the patient's care was started at 9:21 AM.    Chief Complaint  Patient presents with  . Abdominal Cramping   The history is provided by the patient. No language interpreter was used.    HPI Comments: Dominique Garza is a 28 y.o. female G5P3 who presents to the Emergency Department complaining of abdominal cramping. She describes the pain as cramping and is rated 8/10. She has dizziness and nausea which she has had since the beginning of pregnancy. Pt has tried baby aspirin (recommended by OB) which has provided no relief. Pt notes walking worsens the pain. Pt is [redacted] weeks pregnant, this is her 5th pregnancy with 3 live births and one miscarriage. She has received prenatal care. Pt denies vaginal bleeding, loss of fluid, pelvic pressure, vaginal discharge, and a chance of an STD.  Denies any dysuria.   Past Medical History  Diagnosis Date  . Fibromyalgia   . Pregnant 02/16/2014  . Anxiety    Past Surgical History  Procedure Laterality Date  . Tonsillectomy     Family History  Problem Relation Age of Onset  . Fibromyalgia Mother   . Sarcoidosis Mother   . Diabetes Paternal Grandmother   . Hypertension Father    History  Substance Use Topics  . Smoking status: Never Smoker   . Smokeless tobacco: Never Used  . Alcohol Use: No   OB History    Gravida Para Term Preterm AB TAB SAB Ectopic Multiple Living   5 3 2 1 1  1   3      Review of Systems  Constitutional: Negative for fever.  Respiratory: Negative for chest tightness and shortness of breath.   Cardiovascular: Negative for chest pain.  Gastrointestinal: Positive for nausea and abdominal pain. Negative for vomiting.  Genitourinary: Negative for dysuria.  Musculoskeletal: Negative for back pain.  Neurological: Positive for dizziness.  Negative for headaches.  All other systems reviewed and are negative.     Allergies  Review of patient's allergies indicates no known allergies.  Home Medications   Prior to Admission medications   Medication Sig Start Date End Date Taking? Authorizing Provider  acetaminophen (TYLENOL) 500 MG tablet Take 500 mg by mouth every 6 (six) hours as needed for mild pain or headache.   Yes Historical Provider, MD  aspirin EC 81 MG tablet Take 81 mg by mouth at bedtime.    Yes Historical Provider, MD  Prenatal Vit-Min-FA-Fish Oil ([facility] PRENATAL GUMMY PO) Take 1 tablet by mouth daily.   Yes Historical Provider, MD   BP 125/70 mmHg  Pulse 74  Temp(Src) 98.4 F (36.9 C) (Oral)  Resp 14  Ht 5\' 4"  (1.626 m)  Wt 192 lb (87.091 kg)  BMI 32.94 kg/m2  SpO2 100%  LMP 01/03/2014 Physical Exam  Constitutional: She is oriented to person, place, and time. She appears well-developed and well-nourished. No distress.  HENT:  Head: Normocephalic and atraumatic.  Cardiovascular: Normal rate, regular rhythm and normal heart sounds.   No murmur heard. Pulmonary/Chest: Effort normal and breath sounds normal. No respiratory distress. She has no wheezes.  Abdominal: Soft. Bowel sounds are normal. There is no tenderness. There is no rebound and no guarding.  Gravid just above the pubic symphysis  Genitourinary: Vagina normal.  Scant vaginal discharge,  cervical os closed  Neurological: She is alert and oriented to person, place, and time.  Skin: Skin is warm and dry.  Psychiatric: She has a normal mood and affect.  Nursing note and vitals reviewed.   ED Course  Procedures (including critical care time) Labs Review Labs Reviewed  WET PREP, GENITAL - Abnormal; Notable for the following:    Clue Cells Wet Prep HPF POC FEW (*)    WBC, Wet Prep HPF POC MANY (*)    All other components within normal limits  GC/CHLAMYDIA PROBE AMP  URINALYSIS, ROUTINE W REFLEX MICROSCOPIC    Imaging Review [place] [facility]  Limited  03/31/2014   CLINICAL DATA:  Right pelvic pain. [redacted] weeks pregnant by first ultrasound.  EXAM: LIMITED OBSTETRIC ULTRASOUND  FINDINGS: Number of Fetuses: 1  Heart Rate:  141 bpm  Movement: Yes  Presentation: Cephalic  Placental Location: Posterior  Previa: No  Amniotic Fluid (Subjective):  Within normal limits.  BPD:  3.2cm 16w  1d  MATERNAL FINDINGS:  Cervix:  Appears closed.  Uterus/Adnexae:  No abnormality visualized.  IMPRESSION: Approximately 16 week pregnancy. Fetal heart rate 141 beats per min. No acute maternal findings.  This exam is performed on an emergent basis and does not comprehensively evaluate fetal size, dating, or anatomy; follow-up complete OB US should be considered if further fetal assessment is warranted.   Electronically Signed   By: Charlett NoseKevin  Dover M.D.   On: 03/31/2014 12:39     EKG Interpretation None      MDM   Final diagnoses:  Abdominal pain  Abdominal pain in pregnancy   Patient presents with abdominal cramping during pregnancy. Denies any loss of fluids, vaginal bleeding, or urinary symptoms. Is nontoxic on exam. Nontender. Cervical os is closed. Urinalysis without evidence of urinary tract infection or dehydration.  Patient with few clue cells and many whites on wet prep.   Ultrasound shows a 16 week 1 day pregnancy with a fetal heart rate of 141 without any obvious abnormalities. Discussed workup and results with patient.  No obvious source of cramping at this time. Patient encouraged to maintain good hydration and follow-up with OB. Patient was given strict return precautions.  After history, exam, and medical workup I feel the patient has been appropriately medically screened and is safe for discharge home. Pertinent diagnoses were discussed with the patient. Patient was given return precautions.   I personally performed the services described in this documentation, which was scribed in my presence. The recorded information has been reviewed and is  accurate.     Shon Batonourtney F Wafaa Deemer, MD 03/31/14 [phone]

## 2014-03-31 NOTE — Discharge Instructions (Signed)

## 2014-03-31 NOTE — ED Notes (Signed)
Pt comes in with lower abdominal pain since yesterday. Pt states pain is localized to right lower abdomen. Tender on palpation.   FHR [place] at 130 [place].

## 2014-03-31 NOTE — ED Notes (Signed)
US at bedside

## 2014-04-03 LAB — GC/CHLAMYDIA PROBE AMP
CT Probe RNA: NEGATIVE
GC Probe RNA: NEGATIVE

## 2014-04-04 ENCOUNTER — Encounter: Payer: Self-pay | Admitting: Advanced Practice Midwife

## 2014-04-04 ENCOUNTER — Ambulatory Visit (INDEPENDENT_AMBULATORY_CARE_PROVIDER_SITE_OTHER): Admitting: Advanced Practice Midwife

## 2014-04-04 ENCOUNTER — Encounter: Admitting: Advanced Practice Midwife

## 2014-04-04 VITALS — BP 130/68 | Wt 197.0 lb

## 2014-04-04 DIAGNOSIS — Z118 Encounter for screening for other infectious and parasitic diseases: Secondary | ICD-10-CM

## 2014-04-04 DIAGNOSIS — Z3682 Encounter for antenatal screening for nuchal translucency: Secondary | ICD-10-CM

## 2014-04-04 DIAGNOSIS — Z1159 Encounter for screening for other viral diseases: Secondary | ICD-10-CM

## 2014-04-04 DIAGNOSIS — Z3482 Encounter for supervision of other normal pregnancy, second trimester: Secondary | ICD-10-CM

## 2014-04-04 DIAGNOSIS — Z3492 Encounter for supervision of normal pregnancy, unspecified, second trimester: Secondary | ICD-10-CM

## 2014-04-04 DIAGNOSIS — Z331 Pregnant state, incidental: Secondary | ICD-10-CM

## 2014-04-04 DIAGNOSIS — Z1389 Encounter for screening for other disorder: Secondary | ICD-10-CM

## 2014-04-04 LAB — POCT URINALYSIS DIPSTICK
Blood, UA: NEGATIVE
Glucose, UA: NEGATIVE
KETONES UA: NEGATIVE
NITRITE UA: NEGATIVE
PROTEIN UA: NEGATIVE

## 2014-04-04 NOTE — Progress Notes (Signed)
[id number] 4565w5d Estimated Date of Delivery: 09/21/14  Blood pressure 130/68, weight 197 lb (89.359 kg), last menstrual period 01/03/2014.   BP weight and urine results all reviewed and noted.  Please refer to the obstetrical flow sheet for the fundal height and fetal heart rate documentation:  Patient denies any bleeding and no rupture of membranes symptoms or regular contractions.  Patient is without complaints. Says baby ASA gives her fatique and body aches.  Risk/benefits discussed All questions were answered.  Plan:  Continued routine obstetrical care, 2nd IT today FU 4 weeks for anatomy scan

## 2014-04-10 LAB — MATERNAL SCREEN, INTEGRATED #2
AFP MoM: 1.16
AFP, SERUM MAT SCREEN: 34.5 ng/mL
Age risk Down Syndrome: 1:890 {titer}
Calculated Gestational Age: 15.6
Crown Rump Length: 74.3 mm
Estriol Mom: 1.02
Estriol, Free: 0.67 ng/mL
Inhibin A Dimeric: 185 pg/mL
Inhibin A MoM: 1.21
MSS Trisomy 18 Risk: <1:5000 {titer}
NT MoM: 1.07
NUMBER OF FETUSES MAT SCREEN 2: 1
Nuchal Translucency: 1.75 mm
PAPP-A MAT SCREEN: 1464 ng/mL
PAPP-A MoM: 1.5
Rish for ONTD: 1:4200 {titer}
hCG MoM: 2.25
hCG, Serum: 90.3 IU/mL

## 2014-04-13 ENCOUNTER — Emergency Department (HOSPITAL_COMMUNITY): Admission: EM | Admit: 2014-04-13 | Discharge: 2014-04-13 | Disposition: A | Discharge status: Home / Self Care

## 2014-04-13 NOTE — ED Notes (Signed)
No answer

## 2014-04-13 NOTE — ED Notes (Signed)
No answer, no one in lobby BR

## 2014-04-13 NOTE — ED Notes (Signed)
No answer,  Pt had said she was [redacted] weeks pregnant.  NAD

## 2014-05-02 ENCOUNTER — Ambulatory Visit (INDEPENDENT_AMBULATORY_CARE_PROVIDER_SITE_OTHER): Admitting: Women's Health

## 2014-05-02 ENCOUNTER — Ambulatory Visit (INDEPENDENT_AMBULATORY_CARE_PROVIDER_SITE_OTHER)

## 2014-05-02 ENCOUNTER — Other Ambulatory Visit: Payer: Self-pay | Admitting: Advanced Practice Midwife

## 2014-05-02 ENCOUNTER — Encounter: Payer: Self-pay | Admitting: Women's Health

## 2014-05-02 VITALS — BP 118/60 | Wt 206.0 lb

## 2014-05-02 DIAGNOSIS — Z36 Encounter for antenatal screening of mother: Secondary | ICD-10-CM

## 2014-05-02 DIAGNOSIS — O09292 Supervision of pregnancy with other poor reproductive or obstetric history, second trimester: Secondary | ICD-10-CM

## 2014-05-02 DIAGNOSIS — O09892 Supervision of other high risk pregnancies, second trimester: Secondary | ICD-10-CM

## 2014-05-02 DIAGNOSIS — Z1389 Encounter for screening for other disorder: Secondary | ICD-10-CM

## 2014-05-02 DIAGNOSIS — Z3492 Encounter for supervision of normal pregnancy, unspecified, second trimester: Secondary | ICD-10-CM

## 2014-05-02 DIAGNOSIS — O09212 Supervision of pregnancy with history of pre-term labor, second trimester: Secondary | ICD-10-CM

## 2014-05-02 DIAGNOSIS — Z331 Pregnant state, incidental: Secondary | ICD-10-CM

## 2014-05-02 DIAGNOSIS — Z3482 Encounter for supervision of other normal pregnancy, second trimester: Secondary | ICD-10-CM

## 2014-05-02 DIAGNOSIS — Z3689 Encounter for other specified antenatal screening: Secondary | ICD-10-CM

## 2014-05-02 LAB — POCT URINALYSIS DIPSTICK
Blood, UA: NEGATIVE
Glucose, UA: NEGATIVE
KETONES UA: NEGATIVE
LEUKOCYTES UA: NEGATIVE
Nitrite, UA: NEGATIVE
Protein, UA: NEGATIVE

## 2014-05-02 NOTE — Progress Notes (Signed)
U/S(19+5wks)-active fetus, meas c/w dates, fluid WNL, cx appears closed (4.0cm), bilateral adnexa appears WNL, Rt Lateral Gr 0 placenta, no major abnl noted, female fetus, FHR-137 bpm

## 2014-05-02 NOTE — Patient Instructions (Addendum)
[place] Pediatricians:  [facility] [phone]            [facility] [phone]                 [facility] [phone] (usually doesn't accept new patients unless you have family there already, you are always welcome to call and ask)             [facility] ([address] [place]) [phone]   [place] Pediatricians:   [facility]: [phone]  [facility]: [phone]   Second Trimester of Pregnancy The second trimester is from week 13 through week 28, months 4 through 6. The second trimester is often a time when you feel your best. Your body has also adjusted to being pregnant, and you begin to feel better physically. Usually, morning sickness has lessened or quit completely, you may have more energy, and you may have an increase in appetite. The second trimester is also a time when the fetus is growing rapidly. At the end of the sixth month, the fetus is about 9 inches long and weighs about 1 pounds. You will likely begin to feel the baby move (quickening) between 18 and 20 weeks of the pregnancy. BODY CHANGES Your body goes through many changes during pregnancy. The changes vary from woman to woman.  2. Your weight will continue to increase. You will notice your lower abdomen bulging out. 3. You may begin to get stretch marks on your hips, abdomen, and breasts. 4. You may develop headaches that can be relieved by medicines approved by your health care provider. 5. You may urinate more often because the fetus is pressing on your bladder. 6. You may develop or continue to have heartburn as a result of your pregnancy. 7. You may develop constipation because certain hormones are causing the muscles that push waste through your intestines to slow down. 8. You may develop hemorrhoids or swollen, bulging veins (varicose veins). 9. You may have back pain because of the weight gain  and pregnancy hormones relaxing your joints between the bones in your pelvis and as a result of a shift in weight and the muscles that support your balance. 10. Your breasts will continue to grow and be tender. 11. Your gums may bleed and may be sensitive to brushing and flossing. 12. Dark spots or blotches (chloasma, mask of pregnancy) may develop on your face. This will likely fade after the baby is born. 13. A dark line from your belly button to the pubic area (linea nigra) may appear. This will likely fade after the baby is born. 14. You may have changes in your hair. These can include thickening of your hair, rapid growth, and changes in texture. Some women also have hair loss during or after pregnancy, or hair that feels dry or thin. Your hair will most likely return to normal after your baby is born. WHAT TO EXPECT AT YOUR PRENATAL VISITS During a routine prenatal visit: 2. You will be weighed to make sure you and the fetus are growing normally. 3. Your blood pressure will be taken. 4. Your abdomen will be measured to track your baby's growth. 5. The fetal heartbeat will be listened to. 6. Any test results from the previous visit will be discussed. Your health care provider may ask you: 2. How you are feeling. 3. If you are feeling the baby move. 4. If you have had any abnormal symptoms, such as leaking fluid,   bleeding, severe headaches, or abdominal cramping. 5. If you have any questions. Other tests that may be performed during your second trimester include: 2. Blood tests that check for: 1. Low iron levels (anemia). 2. Gestational diabetes (between 24 and 28 weeks). 3. Rh antibodies. 3. Urine tests to check for infections, diabetes, or protein in the urine. 4. An ultrasound to confirm the proper growth and development of the baby. 5. An amniocentesis to check for possible genetic problems. 6. Fetal screens for spina bifida and Down syndrome. HOME CARE INSTRUCTIONS  3. Avoid all  smoking, herbs, alcohol, and unprescribed drugs. These chemicals affect the formation and growth of the baby. 4. Follow your health care provider's instructions regarding medicine use. There are medicines that are either safe or unsafe to take during pregnancy. 5. Exercise only as directed by your health care provider. Experiencing uterine cramps is a good sign to stop exercising. 6. Continue to eat regular, healthy meals. 7. Wear a good support bra for breast tenderness. 8. Do not use hot tubs, steam rooms, or saunas. 9. Wear your seat belt at all times when driving. 10. Avoid raw meat, uncooked cheese, cat litter boxes, and soil used by cats. These carry germs that can cause birth defects in the baby. 11. Take your prenatal vitamins. 12. Try taking a stool softener (if your health care provider approves) if you develop constipation. Eat more high-fiber foods, such as fresh vegetables or fruit and whole grains. Drink plenty of fluids to keep your urine clear or pale yellow. 13. Take warm sitz baths to soothe any pain or discomfort caused by hemorrhoids. Use hemorrhoid cream if your health care provider approves. 14. If you develop varicose veins, wear support hose. Elevate your feet for 15 minutes, 3-4 times a day. Limit salt in your diet. 15. Avoid heavy lifting, wear low heel shoes, and practice good posture. 16. Rest with your legs elevated if you have leg cramps or low back pain. 17. Visit your dentist if you have not gone yet during your pregnancy. Use a soft toothbrush to brush your teeth and be gentle when you floss. 18. A sexual relationship may be continued unless your health care provider directs you otherwise. 19. Continue to go to all your prenatal visits as directed by your health care provider. SEEK MEDICAL CARE IF:   You have dizziness.  You have mild pelvic cramps, pelvic pressure, or nagging pain in the abdominal area.  You have persistent nausea, vomiting, or  diarrhea.  You have a bad smelling vaginal discharge.  You have pain with urination. SEEK IMMEDIATE MEDICAL CARE IF:   You have a fever.  You are leaking fluid from your vagina.  You have spotting or bleeding from your vagina.  You have severe abdominal cramping or pain.  You have rapid weight gain or loss.  You have shortness of breath with chest pain.  You notice sudden or extreme swelling of your face, hands, ankles, feet, or legs.  You have not felt your baby move in over an hour.  You have severe headaches that do not go away with medicine.  You have vision changes. Document Released: 04/21/2001 Document Revised: 05/02/2013 Document Reviewed: 06/28/2012 ExitCare Patient Information 2015 [employer]. This information is not intended to replace advice given to you by your health care provider. Make sure you discuss any questions you have with your health care provider.  

## 2014-05-02 NOTE — Progress Notes (Signed)
Low-risk OB appointment [id number] 4848w5d Estimated Date of Delivery: 09/21/14 BP 118/60 mmHg  Wt 206 lb (93.441 kg)  LMP 01/03/2014  BP, weight, and urine reviewed.  Refer to obstetrical flow sheet for FH & FHR.  Reports good fm.  Denies regular uc's, lof, vb, or uti s/s. No complaints. Reviewed today's normal anatomy u/s, ptl s/s, fm. Plan:  Continue routine obstetrical care  F/U in 4wks for OB appointment  Declined flu shot

## 2014-05-11 NOTE — L&D Delivery Note (Signed)
Patient is 29 y.o. 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  APGAR: 9, 9; weight pending.  Infant dried and placed on mother's abdomen.  Cord clamping delayed for 3 minutes following birth then cut by aunt of baby.  Hospital cord blood sample collected.  Placenta status: Intact, Spontaneous.  Placenta with furcate vessels.  Placenta not quite bi-lobe but figure 8 in appearance.  Cord: 3 vessels with the following complications: None.  Fundal massage applied following delivery of placenta and vagina inspected for lacerations.  Joellyn HaffKim Booker, CNM gloved and actively participated in the entirety of the delivery.  Anesthesia: Epidural  Episiotomy: None Lacerations: None Suture Repair: n/a Est. Blood Loss (mL):  200cc  Mom to postpartum.  Baby to Couplet care / Skin to Skin.  Delynn FlavinGottschalk, Ashly M, DO 09/02/2014, 1:05 AM

## 2014-05-30 ENCOUNTER — Encounter: Admitting: Advanced Practice Midwife

## 2014-05-30 ENCOUNTER — Encounter: Payer: Self-pay | Admitting: Advanced Practice Midwife

## 2014-06-05 ENCOUNTER — Encounter (HOSPITAL_COMMUNITY): Payer: Self-pay | Admitting: *Deleted

## 2014-06-05 ENCOUNTER — Telehealth: Payer: Self-pay | Admitting: *Deleted

## 2014-06-05 ENCOUNTER — Inpatient Hospital Stay (HOSPITAL_COMMUNITY)
Admission: AD | Admit: 2014-06-05 | Discharge: 2014-06-05 | Disposition: A | Discharge status: Home / Self Care | Source: Ambulatory Visit | Attending: Family Medicine | Admitting: Family Medicine

## 2014-06-05 DIAGNOSIS — O9989 Other specified diseases and conditions complicating pregnancy, childbirth and the puerperium: Secondary | ICD-10-CM | POA: Insufficient documentation

## 2014-06-05 DIAGNOSIS — R1030 Lower abdominal pain, unspecified: Secondary | ICD-10-CM | POA: Diagnosis present

## 2014-06-05 DIAGNOSIS — N3001 Acute cystitis with hematuria: Secondary | ICD-10-CM

## 2014-06-05 DIAGNOSIS — Z3A25 25 weeks gestation of pregnancy: Secondary | ICD-10-CM | POA: Insufficient documentation

## 2014-06-05 LAB — WET PREP, GENITAL
Trich, Wet Prep: NONE SEEN
YEAST WET PREP: NONE SEEN

## 2014-06-05 LAB — URINALYSIS, ROUTINE W REFLEX MICROSCOPIC
Bilirubin Urine: NEGATIVE
Glucose, UA: NEGATIVE mg/dL
KETONES UR: 15 mg/dL — AB
NITRITE: POSITIVE — AB
Protein, ur: 30 mg/dL — AB
Specific Gravity, Urine: 1.025 (ref 1.005–1.030)
Urobilinogen, UA: 1 mg/dL (ref 0.0–1.0)
pH: 6.5 (ref 5.0–8.0)

## 2014-06-05 LAB — POCT FERN TEST: POCT FERN TEST: NEGATIVE

## 2014-06-05 LAB — FETAL FIBRONECTIN: FETAL FIBRONECTIN: NEGATIVE

## 2014-06-05 LAB — URINE MICROSCOPIC-ADD ON

## 2014-06-05 LAB — AMNISURE RUPTURE OF MEMBRANE (ROM) NOT AT ARMC: AMNISURE: NEGATIVE

## 2014-06-05 MED ORDER — NITROFURANTOIN MONOHYD MACRO 100 MG PO CAPS: 100.0000 mg | ORAL_CAPSULE | Freq: Two times a day (BID) | ORAL | Status: DC

## 2014-06-05 NOTE — Telephone Encounter (Signed)
Pt states "feels a lot of pressure and feels a need to push, leaking fluid." Pt informed to go to [facility] to be evaluated.

## 2014-06-05 NOTE — Progress Notes (Signed)
Dr Loreta AveAcosta in to discuss test results and d/c plan

## 2014-06-05 NOTE — MAU Provider Note (Signed)
History     CSN: [id number]  Arrival date and time: 06/05/14 1718   None     Chief Complaint  Patient presents with  . Abdominal Cramping   HPI  Patient is 29 y.o. [id number] 7871w4d here with complaints of contractions, pelvic pressure, feels that she needs to push.  Leaking pink fluid.  Symptoms x 2-3 days.    +FM, denies LOF.   Past Medical History  Diagnosis Date  . Fibromyalgia   . Pregnant 02/16/2014  . Anxiety     Past Surgical History  Procedure Laterality Date  . Tonsillectomy      Family History  Problem Relation Age of Onset  . Fibromyalgia Mother   . Sarcoidosis Mother   . Diabetes Paternal Grandmother   . Hypertension Father     History  Substance Use Topics  . Smoking status: Never Smoker   . Smokeless tobacco: Never Used  . Alcohol Use: No    Allergies: No Known Allergies  Prescriptions prior to admission  Medication Sig Dispense Refill Last Dose  . aspirin EC 81 MG tablet Take 81 mg by mouth at bedtime.    06/04/2014 at Unknown time  . Prenatal Vit-Min-FA-Fish Oil ([facility] PRENATAL GUMMY PO) Take 1 tablet by mouth daily.   06/04/2014 at Unknown time    Review of Systems  Constitutional: Negative for fever and chills.  Respiratory: Negative for cough and shortness of breath.   Cardiovascular: Negative for chest pain and leg swelling.  Gastrointestinal: Negative for heartburn, nausea, vomiting and diarrhea.  Genitourinary: Positive for urgency. Negative for dysuria, frequency and hematuria.  Neurological:       No headache   Physical Exam   Blood pressure 120/70, pulse 80, temperature 98.2 F (36.8 C), resp. rate 18, height 5\' 4"  (1.626 m), weight 208 lb (94.348 kg), last menstrual period 01/03/2014.  Physical Exam  Constitutional: She is oriented to person, place, and time. She appears well-developed and well-nourished.  HENT:  Head: Normocephalic and atraumatic.  Eyes: Conjunctivae and EOM are normal.  Neck: Normal range of motion.   Cardiovascular: Normal rate.   Respiratory: Effort normal. No respiratory distress.  GI: Soft. Bowel sounds are normal. She exhibits no distension. There is no tenderness.  Genitourinary: Vagina normal.  Musculoskeletal: Normal range of motion. She exhibits no edema.  Neurological: She is alert and oriented to person, place, and time.  Skin: Skin is warm and dry. No erythema.    MAU Course  Procedures   Labs: Results for orders placed or performed during the hospital encounter of 06/05/14 (from the past 24 hour(s))  Urinalysis, Routine w reflex microscopic   Collection Time: 06/05/14  5:45 PM  Result Value Ref Range   Color, Urine YELLOW YELLOW   APPearance CLOUDY (A) CLEAR   Specific Gravity, Urine 1.025 1.005 - 1.030   pH 6.5 5.0 - 8.0   Glucose, UA NEGATIVE NEGATIVE mg/dL   Hgb urine dipstick LARGE (A) NEGATIVE   Bilirubin Urine NEGATIVE NEGATIVE   Ketones, ur 15 (A) NEGATIVE mg/dL   Protein, ur 30 (A) NEGATIVE mg/dL   Urobilinogen, UA 1.0 0.0 - 1.0 mg/dL   Nitrite POSITIVE (A) NEGATIVE   Leukocytes, UA LARGE (A) NEGATIVE  Urine microscopic-add on   Collection Time: 06/05/14  5:45 PM  Result Value Ref Range   Squamous Epithelial / LPF FEW (A) RARE   WBC, UA 21-50 <3 WBC/hpf   RBC / HPF 11-20 <3 RBC/hpf   Bacteria, UA FEW (A) RARE  Wet prep, genital   Collection Time: 06/05/14  6:10 PM  Result Value Ref Range   Yeast Wet Prep HPF POC NONE SEEN NONE SEEN   Trich, Wet Prep NONE SEEN NONE SEEN   Clue Cells Wet Prep HPF POC MODERATE (A) NONE SEEN   WBC, Wet Prep HPF POC FEW (A) NONE SEEN  Amnisure rupture of membrane (rom)   Collection Time: 06/05/14  6:10 PM  Result Value Ref Range   Amnisure ROM NEGATIVE   Fetal fibronectin   Collection Time: 06/05/14  6:10 PM  Result Value Ref Range   Fetal Fibronectin NEGATIVE NEGATIVE  Fern Test   Collection Time: 06/05/14  6:18 PM  Result Value Ref Range   POCT Fern Test Negative = intact amniotic membranes     Imaging  Studies:  No results found.    Assessment and Plan  Patient is 29 y.o. [id number] [redacted]w[redacted]d reporting vaginal pressure likely secondary to UTI - preterm labor precautions - FFN neg, urine cx, amnisure neg - rx macrobid - G/C obtained =>> positive chlamydia, treated in clinic  Kahil Agner ROCIO 06/05/2014, 7:24 PM

## 2014-06-05 NOTE — MAU Note (Signed)
Pt presents to MAU with complaints of lower abdominal cramping for 2 days and today she noticed that she is having some leakage of clear vaginal fluid. Denies any vaginal bleeding.

## 2014-06-05 NOTE — MAU Note (Signed)
Dr Loreta AveAcosta called and in to see pt before EFM applied. Pt very uncomfortable and feeling pressure. Saw some pink d/c on tissue in BR.

## 2014-06-05 NOTE — Progress Notes (Signed)
Written and verbal d/c instructions given and understanding voiced. Dr Loreta AveAcosta discussed test results and d/c plan with pt earlier

## 2014-06-05 NOTE — Progress Notes (Signed)
Spec exam done. Fern slide made and wet prep obtained

## 2014-06-06 ENCOUNTER — Ambulatory Visit (INDEPENDENT_AMBULATORY_CARE_PROVIDER_SITE_OTHER): Admitting: Advanced Practice Midwife

## 2014-06-06 VITALS — BP 118/80 | Wt 208.0 lb

## 2014-06-06 DIAGNOSIS — Z331 Pregnant state, incidental: Secondary | ICD-10-CM

## 2014-06-06 DIAGNOSIS — Z1389 Encounter for screening for other disorder: Secondary | ICD-10-CM

## 2014-06-06 DIAGNOSIS — Z3492 Encounter for supervision of normal pregnancy, unspecified, second trimester: Secondary | ICD-10-CM

## 2014-06-06 LAB — GC/CHLAMYDIA PROBE AMP (~~LOC~~) NOT AT ARMC
Chlamydia: POSITIVE — AB
Neisseria Gonorrhea: NEGATIVE

## 2014-06-06 NOTE — Patient Instructions (Signed)

## 2014-06-06 NOTE — Progress Notes (Signed)
[id number] 5618w5d Estimated Date of Delivery: 09/21/14  Blood pressure 118/80, weight 211 lb (95.709 kg), last menstrual period 01/03/2014.   BP weight and urine results all reviewed and noted.  Please refer to the obstetrical flow sheet for the fundal height and fetal heart rate documentation:  Patient reports good fetal movement, denies any bleeding and no rupture of membranes symptoms or regular contractions.  Seen last night at MAU with + UTI.,  Rx Macrobid Patient is without complaints. All questions were answered.  Plan:  Continued routine obstetrical care,   Follow up in 3 weeks for OB appointment, PN2

## 2014-06-08 ENCOUNTER — Telehealth (HOSPITAL_COMMUNITY): Payer: Self-pay | Admitting: Family Medicine

## 2014-06-08 DIAGNOSIS — A749 Chlamydial infection, unspecified: Secondary | ICD-10-CM

## 2014-06-08 DIAGNOSIS — O98812 Other maternal infectious and parasitic diseases complicating pregnancy, second trimester: Principal | ICD-10-CM

## 2014-06-08 MED ORDER — AZITHROMYCIN 500 MG PO TABS: 500.0000 mg | ORAL_TABLET | Freq: Once | ORAL | Status: AC

## 2014-06-08 NOTE — Telephone Encounter (Signed)
Telephone call to patient regarding positive chlamydia culture, patient notified.  Patient has not been treated and Rx called in per protocol.  Instructed patient to complete take Rx and to notify her partner for treatment.  Report of treatment faxed to health department.

## 2014-06-26 NOTE — Addendum Note (Signed)
Addended by: Pennie BanterSMITH, Claborn Janusz W on: 06/26/2014 01:08 PM   Modules accepted: Orders

## 2014-06-28 ENCOUNTER — Encounter: Payer: Self-pay | Admitting: Obstetrics & Gynecology

## 2014-06-28 ENCOUNTER — Ambulatory Visit (INDEPENDENT_AMBULATORY_CARE_PROVIDER_SITE_OTHER): Admitting: Obstetrics & Gynecology

## 2014-06-28 ENCOUNTER — Other Ambulatory Visit

## 2014-06-28 VITALS — BP 108/64 | Wt 210.0 lb

## 2014-06-28 DIAGNOSIS — Z114 Encounter for screening for human immunodeficiency virus [HIV]: Secondary | ICD-10-CM

## 2014-06-28 DIAGNOSIS — Z3483 Encounter for supervision of other normal pregnancy, third trimester: Secondary | ICD-10-CM

## 2014-06-28 DIAGNOSIS — Z331 Pregnant state, incidental: Secondary | ICD-10-CM

## 2014-06-28 DIAGNOSIS — Z131 Encounter for screening for diabetes mellitus: Secondary | ICD-10-CM

## 2014-06-28 DIAGNOSIS — Z1389 Encounter for screening for other disorder: Secondary | ICD-10-CM

## 2014-06-28 DIAGNOSIS — Z0184 Encounter for antibody response examination: Secondary | ICD-10-CM

## 2014-06-28 DIAGNOSIS — Z3492 Encounter for supervision of normal pregnancy, unspecified, second trimester: Secondary | ICD-10-CM

## 2014-06-28 DIAGNOSIS — Z113 Encounter for screening for infections with a predominantly sexual mode of transmission: Secondary | ICD-10-CM

## 2014-06-28 LAB — POCT URINALYSIS DIPSTICK
Blood, UA: NEGATIVE
Glucose, UA: NEGATIVE
Ketones, UA: NEGATIVE
Leukocytes, UA: NEGATIVE
Nitrite, UA: NEGATIVE
Protein, UA: NEGATIVE

## 2014-06-28 NOTE — Progress Notes (Signed)
Pt denies any problems or concerns at this time.  

## 2014-06-28 NOTE — Progress Notes (Signed)
[id number] 648w6d Estimated Date of Delivery: 09/21/14  Blood pressure 108/64, weight 210 lb (95.255 kg), last menstrual period 01/03/2014.   BP weight and urine results all reviewed and noted.  Please refer to the obstetrical flow sheet for the fundal height and fetal heart rate documentation:  Patient reports good fetal movement, denies any bleeding and no rupture of membranes symptoms or regular contractions. Patient is without complaints. All questions were answered.  Plan:  Continued routine obstetrical care,     Follow up in 3 weeks for OB appointment, routine  Taking her ASA

## 2014-06-29 LAB — CBC
HEMATOCRIT: 33.1 % — AB (ref 34.0–46.6)
HEMOGLOBIN: 11.2 g/dL (ref 11.1–15.9)
MCH: 30.5 pg (ref 26.6–33.0)
MCHC: 33.8 g/dL (ref 31.5–35.7)
MCV: 90 fL (ref 79–97)
Platelets: 176 10*3/uL (ref 150–379)
RBC: 3.67 x10E6/uL — ABNORMAL LOW (ref 3.77–5.28)
RDW: 13.9 % (ref 12.3–15.4)
WBC: 10.6 10*3/uL (ref 3.4–10.8)

## 2014-06-29 LAB — ANTIBODY SCREEN: Antibody Screen: NEGATIVE

## 2014-06-29 LAB — HIV ANTIBODY (ROUTINE TESTING W REFLEX): HIV Screen 4th Generation wRfx: NONREACTIVE

## 2014-06-29 LAB — GLUCOSE TOLERANCE, 2 HOURS W/ 1HR
Glucose, 1 hour: 112 mg/dL (ref 65–179)
Glucose, 2 hour: 100 mg/dL (ref 65–152)
Glucose, Fasting: 65 mg/dL (ref 65–91)

## 2014-06-29 LAB — HSV 2 ANTIBODY, IGG: HSV 2 Glycoprotein G Ab, IgG: 6.06 index — ABNORMAL HIGH (ref 0.00–0.90)

## 2014-06-29 LAB — RPR: RPR Ser Ql: NONREACTIVE

## 2014-07-10 ENCOUNTER — Encounter: Admitting: Advanced Practice Midwife

## 2014-07-19 ENCOUNTER — Ambulatory Visit (INDEPENDENT_AMBULATORY_CARE_PROVIDER_SITE_OTHER): Admitting: Advanced Practice Midwife

## 2014-07-19 VITALS — BP 110/80 | HR 64 | Wt 213.0 lb

## 2014-07-19 DIAGNOSIS — Z1389 Encounter for screening for other disorder: Secondary | ICD-10-CM

## 2014-07-19 DIAGNOSIS — Z331 Pregnant state, incidental: Secondary | ICD-10-CM

## 2014-07-19 DIAGNOSIS — Z3493 Encounter for supervision of normal pregnancy, unspecified, third trimester: Secondary | ICD-10-CM

## 2014-07-19 LAB — POCT URINALYSIS DIPSTICK
Blood, UA: NEGATIVE
GLUCOSE UA: NEGATIVE
Ketones, UA: NEGATIVE
LEUKOCYTES UA: NEGATIVE
Nitrite, UA: NEGATIVE
Protein, UA: NEGATIVE

## 2014-07-19 NOTE — Progress Notes (Signed)
[id number] 6254w6d Estimated Date of Delivery: 09/21/14  Blood pressure 110/80, pulse 64, weight 213 lb (96.616 kg), last menstrual period 01/03/2014.   BP weight and urine results all reviewed and noted.  Please refer to the obstetrical flow sheet for the fundal height and fetal heart rate documentation:  Patient reports good fetal movement, denies any bleeding and no rupture of membranes symptoms or regular contractions. Patient is without complaints. All questions were answered.  Plan:  Continued routine obstetrical care,   Follow up in 2 weeks for OB appointment,

## 2014-08-02 ENCOUNTER — Ambulatory Visit (INDEPENDENT_AMBULATORY_CARE_PROVIDER_SITE_OTHER): Admitting: Advanced Practice Midwife

## 2014-08-02 VITALS — BP 110/80 | HR 76 | Wt 214.0 lb

## 2014-08-02 DIAGNOSIS — Z1389 Encounter for screening for other disorder: Secondary | ICD-10-CM

## 2014-08-02 DIAGNOSIS — R768 Other specified abnormal immunological findings in serum: Secondary | ICD-10-CM

## 2014-08-02 DIAGNOSIS — Z3493 Encounter for supervision of normal pregnancy, unspecified, third trimester: Secondary | ICD-10-CM

## 2014-08-02 DIAGNOSIS — F5089 Other specified eating disorder: Secondary | ICD-10-CM

## 2014-08-02 DIAGNOSIS — Z331 Pregnant state, incidental: Secondary | ICD-10-CM

## 2014-08-02 LAB — POCT URINALYSIS DIPSTICK
Blood, UA: NEGATIVE
Glucose, UA: NEGATIVE
Ketones, UA: NEGATIVE
LEUKOCYTES UA: NEGATIVE
Nitrite, UA: NEGATIVE
PROTEIN UA: NEGATIVE

## 2014-08-02 MED ORDER — ACYCLOVIR 400 MG PO TABS: 400.0000 mg | ORAL_TABLET | Freq: Three times a day (TID) | ORAL | Status: DC

## 2014-08-02 NOTE — Progress Notes (Addendum)
[id number] 488w6d Estimated Date of Delivery: 09/21/14  Blood pressure 110/80, pulse 76, weight 214 lb (97.07 kg), last menstrual period 01/03/2014.   BP weight and urine results all reviewed and noted.  Please refer to the obstetrical flow sheet for the fundal height and fetal heart rate documentation:  Patient reports good fetal movement, denies any bleeding and no rupture of membranes symptoms or regular contractions. Patient is without complaints. She has developed Pica, eating ice and 2-3 pieces of toilet paper a day. All questions were answered.  Plan:  Continued routine obstetrical care, Try to resist temptation to eat toilet paper/ice.  Try the biodegradable brand of tissue if she must eat it.  Start acyclovir 400mg  TID next week Follow up in 2 weeks for OB appointment,

## 2014-08-16 ENCOUNTER — Ambulatory Visit (INDEPENDENT_AMBULATORY_CARE_PROVIDER_SITE_OTHER): Admitting: Advanced Practice Midwife

## 2014-08-16 ENCOUNTER — Encounter: Payer: Self-pay | Admitting: Advanced Practice Midwife

## 2014-08-16 VITALS — BP 110/76 | HR 80 | Wt 220.0 lb

## 2014-08-16 DIAGNOSIS — Z1389 Encounter for screening for other disorder: Secondary | ICD-10-CM

## 2014-08-16 DIAGNOSIS — Z3493 Encounter for supervision of normal pregnancy, unspecified, third trimester: Secondary | ICD-10-CM

## 2014-08-16 DIAGNOSIS — Z331 Pregnant state, incidental: Secondary | ICD-10-CM

## 2014-08-16 LAB — POCT URINALYSIS DIPSTICK
Blood, UA: NEGATIVE
Glucose, UA: NEGATIVE
KETONES UA: NEGATIVE
LEUKOCYTES UA: NEGATIVE
NITRITE UA: NEGATIVE
Protein, UA: NEGATIVE

## 2014-08-16 NOTE — Progress Notes (Signed)
[id number] 5770w6d Estimated Date of Delivery: 09/21/14  Blood pressure 110/76, pulse 80, weight 220 lb (99.791 kg), last menstrual period 01/03/2014.   BP weight and urine results all reviewed and noted.  Please refer to the obstetrical flow sheet for the fundal height and fetal heart rate documentation:  Patient reports good fetal movement, denies any bleeding and no rupture of membranes symptoms or regular contractions. Patient is without complaints. All questions were answered.  Plan:  Continued routine obstetrical care,   Follow up in 2 weeks for OB appointment,

## 2014-08-16 NOTE — Addendum Note (Signed)
Addended by: Jacklyn ShellRESENZO-DISHMON, Laelah Siravo on: 08/16/2014 10:31 AM   Modules accepted: Level of Service

## 2014-08-16 NOTE — Patient Instructions (Addendum)
Sciatica with Rehab The sciatic nerve runs from the back down the leg and is responsible for sensation and control of the muscles in the back (posterior) side of the thigh, lower leg, and foot. Sciatica is a condition that is characterized by inflammation of this nerve.  SYMPTOMS   Signs of nerve damage, including numbness and/or weakness along the posterior side of the lower extremity.  Pain in the back of the thigh that may also travel down the leg.  Pain that worsens when sitting for long periods of time.  Occasionally, pain in the back or buttock. CAUSES  Inflammation of the sciatic nerve is the cause of sciatica. The inflammation is due to something irritating the nerve. Common sources of irritation include:  Sitting for long periods of time.  Direct trauma to the nerve.  Arthritis of the spine.  Herniated or ruptured disk.  Slipping of the vertebrae (spondylolisthesis).  Pressure from soft tissues, such as muscles or ligament-like tissue (fascia). RISK INCREASES WITH:  Sports that place pressure or stress on the spine (football or weightlifting).  Poor strength and flexibility.  Failure to warm up properly before activity.  Family history of low back pain or disk disorders.  Previous back injury or surgery.  Poor body mechanics, especially when lifting, or poor posture. PREVENTION   Warm up and stretch properly before activity.  Maintain physical fitness:  Strength, flexibility, and endurance.  Cardiovascular fitness.  Learn and use proper technique, especially with posture and lifting. When possible, have coach correct improper technique.  Avoid activities that place stress on the spine. PROGNOSIS If treated properly, then sciatica usually resolves within 6 weeks. However, occasionally surgery is necessary.  RELATED COMPLICATIONS   Permanent nerve damage, including pain, numbness, tingle, or weakness.  Chronic back pain.  Risks of surgery: infection,  bleeding, nerve damage, or damage to surrounding tissues. TREATMENT Treatment initially involves resting from any activities that aggravate your symptoms. The use of ice and medication may help reduce pain and inflammation. The use of strengthening and stretching exercises may help reduce pain with activity. These exercises may be performed at home or with referral to a therapist. A therapist may recommend further treatments, such as transcutaneous electronic nerve stimulation (TENS) or ultrasound. Your caregiver may recommend corticosteroid injections to help reduce inflammation of the sciatic nerve. If symptoms persist despite non-surgical (conservative) treatment, then surgery may be recommended. MEDICATION  If pain medication is necessary, then nonsteroidal anti-inflammatory medications, such as aspirin and ibuprofen, or other minor pain relievers, such as acetaminophen, are often recommended.  Do not take pain medication for 7 days before surgery.  Prescription pain relievers may be given if deemed necessary by your caregiver. Use only as directed and only as much as you need.  Ointments applied to the skin may be helpful.  Corticosteroid injections may be given by your caregiver. These injections should be reserved for the most serious cases, because they may only be given a certain number of times. HEAT AND COLD  Cold treatment (icing) relieves pain and reduces inflammation. Cold treatment should be applied for 10 to 15 minutes every 2 to 3 hours for inflammation and pain and immediately after any activity that aggravates your symptoms. Use ice packs or massage the area with a piece of ice (ice massage).  Heat treatment may be used prior to performing the stretching and strengthening activities prescribed by your caregiver, physical therapist, or athletic trainer. Use a heat pack or soak the injury in warm water.   SEEK MEDICAL CARE IF:  Treatment seems to offer no benefit, or the condition  worsens.  Any medications produce adverse side effects. EXERCISES  RANGE OF MOTION (ROM) AND STRETCHING EXERCISES - Sciatica Most people with sciatic will find that their symptoms worsen with either excessive bending forward (flexion) or arching at the low back (extension). The exercises which will help resolve your symptoms will focus on the opposite motion. Your physician, physical therapist or athletic trainer will help you determine which exercises will be most helpful to resolve your low back pain. Do not complete any exercises without first consulting with your clinician. Discontinue any exercises which worsen your symptoms until you speak to your clinician. If you have pain, numbness or tingling which travels down into your buttocks, leg or foot, the goal of the therapy is for these symptoms to move closer to your back and eventually resolve. Occasionally, these leg symptoms will get better, but your low back pain may worsen; this is typically an indication of progress in your rehabilitation. Be certain to be very alert to any changes in your symptoms and the activities in which you participated in the 24 hours prior to the change. Sharing this information with your clinician will allow him/her to most efficiently treat your condition. These exercises may help you when beginning to rehabilitate your injury. Your symptoms may resolve with or without further involvement from your physician, physical therapist or athletic trainer. While completing these exercises, remember:   Restoring tissue flexibility helps normal motion to return to the joints. This allows healthier, less painful movement and activity.  An effective stretch should be held for at least 30 seconds.  A stretch should never be painful. You should only feel a gentle lengthening or release in the stretched tissue. FLEXION RANGE OF MOTION AND STRETCHING EXERCISES: STRETCH - Flexion, Single Knee to Chest   Lie on a firm bed or floor  with both legs extended in front of you.  Keeping one leg in contact with the floor, bring your opposite knee to your chest. Hold your leg in place by either grabbing behind your thigh or at your knee.  Pull until you feel a gentle stretch in your low back. Hold __________ seconds.  Slowly release your grasp and repeat the exercise with the opposite side. Repeat __________ times. Complete this exercise __________ times per day.  STRETCH - Flexion, Double Knee to Chest  Lie on a firm bed or floor with both legs extended in front of you.  Keeping one leg in contact with the floor, bring your opposite knee to your chest.  Tense your stomach muscles to support your back and then lift your other knee to your chest. Hold your legs in place by either grabbing behind your thighs or at your knees.  Pull both knees toward your chest until you feel a gentle stretch in your low back. Hold __________ seconds.  Tense your stomach muscles and slowly return one leg at a time to the floor. Repeat __________ times. Complete this exercise __________ times per day.  STRETCH - Low Trunk Rotation   Lie on a firm bed or floor. Keeping your legs in front of you, bend your knees so they are both pointed toward the ceiling and your feet are flat on the floor.  Extend your arms out to the side. This will stabilize your upper body by keeping your shoulders in contact with the floor.  Gently and slowly drop both knees together to one side until   you feel a gentle stretch in your low back. Hold for __________ seconds.  Tense your stomach muscles to support your low back as you bring your knees back to the starting position. Repeat the exercise to the other side. Repeat __________ times. Complete this exercise __________ times per day  EXTENSION RANGE OF MOTION AND FLEXIBILITY EXERCISES: STRETCH - Extension, Prone on Elbows  Lie on your stomach on the floor, a bed will be too soft. Place your palms about shoulder  width apart and at the height of your head.  Place your elbows under your shoulders. If this is too painful, stack pillows under your chest.  Allow your body to relax so that your hips drop lower and make contact more completely with the floor.  Hold this position for __________ seconds.  Slowly return to lying flat on the floor. Repeat __________ times. Complete this exercise __________ times per day.  RANGE OF MOTION - Extension, Prone Press Ups  Lie on your stomach on the floor, a bed will be too soft. Place your palms about shoulder width apart and at the height of your head.  Keeping your back as relaxed as possible, slowly straighten your elbows while keeping your hips on the floor. You may adjust the placement of your hands to maximize your comfort. As you gain motion, your hands will come more underneath your shoulders.  Hold this position __________ seconds.  Slowly return to lying flat on the floor. Repeat __________ times. Complete this exercise __________ times per day.  STRENGTHENING EXERCISES - Sciatica  These exercises may help you when beginning to rehabilitate your injury. These exercises should be done near your "sweet spot." This is the neutral, low-back arch, somewhere between fully rounded and fully arched, that is your least painful position. When performed in this safe range of motion, these exercises can be used for people who have either a flexion or extension based injury. These exercises may resolve your symptoms with or without further involvement from your physician, physical therapist or athletic trainer. While completing these exercises, remember:   Muscles can gain both the endurance and the strength needed for everyday activities through controlled exercises.  Complete these exercises as instructed by your physician, physical therapist or athletic trainer. Progress with the resistance and repetition exercises only as your caregiver advises.  You may  experience muscle soreness or fatigue, but the pain or discomfort you are trying to eliminate should never worsen during these exercises. If this pain does worsen, stop and make certain you are following the directions exactly. If the pain is still present after adjustments, discontinue the exercise until you can discuss the trouble with your clinician. STRENGTHENING - Deep Abdominals, Pelvic Tilt   Lie on a firm bed or floor. Keeping your legs in front of you, bend your knees so they are both pointed toward the ceiling and your feet are flat on the floor.  Tense your lower abdominal muscles to press your low back into the floor. This motion will rotate your pelvis so that your tail bone is scooping upwards rather than pointing at your feet or into the floor.  With a gentle tension and even breathing, hold this position for __________ seconds. Repeat __________ times. Complete this exercise __________ times per day.  STRENGTHENING - Abdominals, Crunches   Lie on a firm bed or floor. Keeping your legs in front of you, bend your knees so they are both pointed toward the ceiling and your feet are flat on the   floor. Cross your arms over your chest.  Slightly tip your chin down without bending your neck.  Tense your abdominals and slowly lift your trunk high enough to just clear your shoulder blades. Lifting higher can put excessive stress on the low back and does not further strengthen your abdominal muscles.  Control your return to the starting position. Repeat __________ times. Complete this exercise __________ times per day.  STRENGTHENING - Quadruped, Opposite UE/LE Lift  Assume a hands and knees position on a firm surface. Keep your hands under your shoulders and your knees under your hips. You may place padding under your knees for comfort.  Find your neutral spine and gently tense your abdominal muscles so that you can maintain this position. Your shoulders and hips should form a rectangle  that is parallel with the floor and is not twisted.  Keeping your trunk steady, lift your right hand no higher than your shoulder and then your left leg no higher than your hip. Make sure you are not holding your breath. Hold this position __________ seconds.  Continuing to keep your abdominal muscles tense and your back steady, slowly return to your starting position. Repeat with the opposite arm and leg. Repeat __________ times. Complete this exercise __________ times per day.  STRENGTHENING - Abdominals and Quadriceps, Straight Leg Raise   Lie on a firm bed or floor with both legs extended in front of you.  Keeping one leg in contact with the floor, bend the other knee so that your foot can rest flat on the floor.  Find your neutral spine, and tense your abdominal muscles to maintain your spinal position throughout the exercise.  Slowly lift your straight leg off the floor about 6 inches for a count of 15, making sure to not hold your breath.  Still keeping your neutral spine, slowly lower your leg all the way to the floor. Repeat this exercise with each leg __________ times. Complete this exercise __________ times per day. POSTURE AND BODY MECHANICS CONSIDERATIONS - Sciatica Keeping correct posture when sitting, standing or completing your activities will reduce the stress put on different body tissues, allowing injured tissues a chance to heal and limiting painful experiences. The following are general guidelines for improved posture. Your physician or physical therapist will provide you with any instructions specific to your needs. While reading these guidelines, remember:  The exercises prescribed by your provider will help you have the flexibility and strength to maintain correct postures.  The correct posture provides the optimal environment for your joints to work. All of your joints have less wear and tear when properly supported by a spine with good posture. This means you will  experience a healthier, less painful body.  Correct posture must be practiced with all of your activities, especially prolonged sitting and standing. Correct posture is as important when doing repetitive low-stress activities (typing) as it is when doing a single heavy-load activity (lifting). RESTING POSITIONS Consider which positions are most painful for you when choosing a resting position. If you have pain with flexion-based activities (sitting, bending, stooping, squatting), choose a position that allows you to rest in a less flexed posture. You would want to avoid curling into a fetal position on your side. If your pain worsens with extension-based activities (prolonged standing, working overhead), avoid resting in an extended position such as sleeping on your stomach. Most people will find more comfort when they rest with their spine in a more neutral position, neither too rounded nor too   arched. Lying on a non-sagging bed on your side with a pillow between your knees, or on your back with a pillow under your knees will often provide some relief. Keep in mind, being in any one position for a prolonged period of time, no matter how correct your posture, can still lead to stiffness. PROPER SITTING POSTURE In order to minimize stress and discomfort on your spine, you must sit with correct posture Sitting with good posture should be effortless for a healthy body. Returning to good posture is a gradual process. Many people can work toward this most comfortably by using various supports until they have the flexibility and strength to maintain this posture on their own. When sitting with proper posture, your ears will fall over your shoulders and your shoulders will fall over your hips. You should use the back of the chair to support your upper back. Your low back will be in a neutral position, just slightly arched. You may place a small pillow or folded towel at the base of your low back for support.  When  working at a desk, create an environment that supports good, upright posture. Without extra support, muscles fatigue and lead to excessive strain on joints and other tissues. Keep these recommendations in mind: CHAIR:   A chair should be able to slide under your desk when your back makes contact with the back of the chair. This allows you to work closely.  The chair's height should allow your eyes to be level with the upper part of your monitor and your hands to be slightly lower than your elbows. BODY POSITION  Your feet should make contact with the floor. If this is not possible, use a foot rest.  Keep your ears over your shoulders. This will reduce stress on your neck and low back. INCORRECT SITTING POSTURES   If you are feeling tired and unable to assume a healthy sitting posture, do not slouch or slump. This puts excessive strain on your back tissues, causing more damage and pain. Healthier options include:  Using more support, like a lumbar pillow.  Switching tasks to something that requires you to be upright or walking.  Talking a brief walk.  Lying down to rest in a neutral-spine position. PROLONGED STANDING WHILE SLIGHTLY LEANING FORWARD  When completing a task that requires you to lean forward while standing in one place for a long time, place either foot up on a stationary 2-4 inch high object to help maintain the best posture. When both feet are on the ground, the low back tends to lose its slight inward curve. If this curve flattens (or becomes too large), then the back and your other joints will experience too much stress, fatigue more quickly and can cause pain.  CORRECT STANDING POSTURES Proper standing posture should be assumed with all daily activities, even if they only take a few moments, like when brushing your teeth. As in sitting, your ears should fall over your shoulders and your shoulders should fall over your hips. You should keep a slight tension in your abdominal  muscles to brace your spine. Your tailbone should point down to the ground, not behind your body, resulting in an over-extended swayback posture.  INCORRECT STANDING POSTURES  Common incorrect standing postures include a forward head, locked knees and/or an excessive swayback. WALKING Walk with an upright posture. Your ears, shoulders and hips should all line-up. PROLONGED ACTIVITY IN A FLEXED POSITION When completing a task that requires you to bend forward   at your waist or lean over a low surface, try to find a way to stabilize 3 of 4 of your limbs. You can place a hand or elbow on your thigh or rest a knee on the surface you are reaching across. This will provide you more stability so that your muscles do not fatigue as quickly. By keeping your knees relaxed, or slightly bent, you will also reduce stress across your low back. CORRECT LIFTING TECHNIQUES DO :   Assume a wide stance. This will provide you more stability and the opportunity to get as close as possible to the object which you are lifting.  Tense your abdominals to brace your spine; then bend at the knees and hips. Keeping your back locked in a neutral-spine position, lift using your leg muscles. Lift with your legs, keeping your back straight.  Test the weight of unknown objects before attempting to lift them.  Try to keep your elbows locked down at your sides in order get the best strength from your shoulders when carrying an object.  Always ask for help when lifting heavy or awkward objects. INCORRECT LIFTING TECHNIQUES DO NOT:   Lock your knees when lifting, even if it is a small object.  Bend and twist. Pivot at your feet or move your feet when needing to change directions.  Assume that you cannot safely pick up a paperclip without proper posture. Document Released: 04/27/2005 Document Revised: 09/11/2013 Document Reviewed: 08/09/2008 [facility] Patient Information 2015 [place], [place]. This information is not intended to  replace advice given to you by your health care provider. Make sure you discuss any questions you have with your health care provider. Intrauterine Device Information An intrauterine device (IUD) is inserted into your uterus to prevent pregnancy. There are two types of IUDs available:   Copper IUD--This type of IUD is wrapped in copper wire and is placed inside the uterus. Copper makes the uterus and fallopian tubes produce a fluid that kills sperm. The copper IUD can stay in place for 10 years.  Hormone IUD--This type of IUD contains the hormone progestin (synthetic progesterone). The hormone thickens the cervical mucus and prevents sperm from entering the uterus. It also thins the uterine lining to prevent implantation of a fertilized egg. The hormone can weaken or kill the sperm that get into the uterus. One type of hormone IUD can stay in place for 5 years, and another type can stay in place for 3 years. Your health care provider will make sure you are a good candidate for a contraceptive IUD. Discuss with your health care provider the possible side effects.  ADVANTAGES OF AN INTRAUTERINE DEVICE  IUDs are highly effective, reversible, long acting, and low maintenance.   There are no estrogen-related side effects.   An IUD can be used when breastfeeding.   IUDs are not associated with weight gain.   The copper IUD works immediately after insertion.   The hormone IUD works right away if inserted within 7 days of your period starting. You will need to use a backup method of birth control for 7 days if the hormone IUD is inserted at any other time in your cycle.  The copper IUD does not interfere with your female hormones.   The hormone IUD can make heavy menstrual periods lighter and decrease cramping.   The hormone IUD can be used for 3 or 5 years.   The copper IUD can be used for 10 years. DISADVANTAGES OF AN INTRAUTERINE DEVICE  The hormone  IUD can be associated with  irregular bleeding patterns.   The copper IUD can make your menstrual flow heavier and more painful.   You may experience cramping and vaginal bleeding after insertion.  Document Released: 03/31/2004 Document Revised: 12/28/2012 Document Reviewed: 10/16/2012 [facility] Patient Information 2015 [place]. This information is not intended to replace advice given to you by your health care provider. Make sure you discuss any questions you have with your health care provider.

## 2014-08-16 NOTE — Progress Notes (Signed)
Pt states that she has a sharp pain that starts in her lower back and radiates to her neck.

## 2014-08-22 ENCOUNTER — Ambulatory Visit (INDEPENDENT_AMBULATORY_CARE_PROVIDER_SITE_OTHER): Admitting: Advanced Practice Midwife

## 2014-08-22 VITALS — BP 128/80 | HR 76 | Wt 222.0 lb

## 2014-08-22 DIAGNOSIS — Z3493 Encounter for supervision of normal pregnancy, unspecified, third trimester: Secondary | ICD-10-CM

## 2014-08-22 DIAGNOSIS — Z331 Pregnant state, incidental: Secondary | ICD-10-CM

## 2014-08-22 DIAGNOSIS — Z1389 Encounter for screening for other disorder: Secondary | ICD-10-CM

## 2014-08-22 LAB — POCT URINALYSIS DIPSTICK
Glucose, UA: NEGATIVE
Ketones, UA: NEGATIVE
LEUKOCYTES UA: NEGATIVE
NITRITE UA: NEGATIVE
PROTEIN UA: NEGATIVE
RBC UA: NEGATIVE

## 2014-08-22 NOTE — Progress Notes (Signed)
[id number] Estimated Date of Delivery: 09/21/14  Blood pressure 128/80, pulse 76, weight 222 lb (100.699 kg), last menstrual period 01/03/2014.   BP weight and urine results all reviewed and noted.  Please refer to the obstetrical flow sheet for the fundal height and fetal heart rate documentation:  Patient reports good fetal movement, denies any bleeding and no rupture of membranes symptoms or regular contractions. Patient is without complaints other than normal pregnancy coimplaints All questions were answered.  Plan:  Continued routine obstetrical care,   Follow up in 1 weeks for OB appointment,

## 2014-08-29 ENCOUNTER — Ambulatory Visit (INDEPENDENT_AMBULATORY_CARE_PROVIDER_SITE_OTHER): Admitting: Advanced Practice Midwife

## 2014-08-29 ENCOUNTER — Other Ambulatory Visit: Payer: Self-pay | Admitting: Advanced Practice Midwife

## 2014-08-29 VITALS — BP 132/84 | HR 76 | Wt 221.8 lb

## 2014-08-29 DIAGNOSIS — Z3493 Encounter for supervision of normal pregnancy, unspecified, third trimester: Secondary | ICD-10-CM

## 2014-08-29 DIAGNOSIS — Z1389 Encounter for screening for other disorder: Secondary | ICD-10-CM

## 2014-08-29 DIAGNOSIS — Z331 Pregnant state, incidental: Secondary | ICD-10-CM

## 2014-08-29 DIAGNOSIS — Z3685 Encounter for antenatal screening for Streptococcus B: Secondary | ICD-10-CM

## 2014-08-29 DIAGNOSIS — O26843 Uterine size-date discrepancy, third trimester: Secondary | ICD-10-CM

## 2014-08-29 DIAGNOSIS — Z369 Encounter for antenatal screening, unspecified: Secondary | ICD-10-CM

## 2014-08-29 LAB — OB RESULTS CONSOLE GC/CHLAMYDIA
Chlamydia: NEGATIVE
Gonorrhea: NEGATIVE

## 2014-08-29 LAB — POCT URINALYSIS DIPSTICK
Glucose, UA: NEGATIVE
Ketones, UA: NEGATIVE
Leukocytes, UA: NEGATIVE
Nitrite, UA: NEGATIVE
Protein, UA: NEGATIVE
RBC UA: NEGATIVE

## 2014-08-29 LAB — OB RESULTS CONSOLE GBS: STREP GROUP B AG: NEGATIVE

## 2014-08-29 NOTE — Patient Instructions (Signed)

## 2014-08-29 NOTE — Progress Notes (Signed)
[id number] Estimated Date of Delivery: 09/21/14  Blood pressure 132/84, pulse 76, weight 221 lb 12 oz (100.585 kg), last menstrual period 01/03/2014.   BP weight and urine results all reviewed and noted.  Please refer to the obstetrical flow sheet for the fundal height and fetal heart rate documentation: Baby feels small  Patient reports good fetal movement, denies any bleeding and no rupture of membranes symptoms or regular contractions. Patient C/O HA off and on for past 3 days.  Says her 81mg  ASA helps. All questions were answered.  Plan:  Continued routine obstetrical care, preX warning signs give. GBS today  Follow up in 2 days for OB appointment, BP check

## 2014-08-30 ENCOUNTER — Ambulatory Visit (INDEPENDENT_AMBULATORY_CARE_PROVIDER_SITE_OTHER): Admitting: Obstetrics and Gynecology

## 2014-08-30 ENCOUNTER — Encounter: Payer: Self-pay | Admitting: Obstetrics and Gynecology

## 2014-08-30 VITALS — BP 134/80 | HR 64 | Wt 224.0 lb

## 2014-08-30 DIAGNOSIS — Z3493 Encounter for supervision of normal pregnancy, unspecified, third trimester: Secondary | ICD-10-CM

## 2014-08-30 DIAGNOSIS — O4703 False labor before 37 completed weeks of gestation, third trimester: Secondary | ICD-10-CM

## 2014-08-30 DIAGNOSIS — O26893 Other specified pregnancy related conditions, third trimester: Secondary | ICD-10-CM

## 2014-08-30 DIAGNOSIS — N898 Other specified noninflammatory disorders of vagina: Secondary | ICD-10-CM

## 2014-08-30 DIAGNOSIS — Z1389 Encounter for screening for other disorder: Secondary | ICD-10-CM

## 2014-08-30 DIAGNOSIS — Z331 Pregnant state, incidental: Secondary | ICD-10-CM

## 2014-08-30 LAB — POCT URINALYSIS DIPSTICK
Blood, UA: NEGATIVE
GLUCOSE UA: NEGATIVE
Ketones, UA: NEGATIVE
Leukocytes, UA: NEGATIVE
NITRITE UA: NEGATIVE
Protein, UA: NEGATIVE

## 2014-08-30 LAB — POCT WET PREP (WET MOUNT)
Bacteria Wet Prep HPF POC: NORMAL
Clue Cells Wet Prep Whiff POC: NEGATIVE
Trichomonas Wet Prep HPF POC: NEGATIVE

## 2014-08-30 NOTE — Progress Notes (Signed)
Pt following up on her BP today. Pt states that she has had a gush of fluid today and some pain on her left side.

## 2014-08-30 NOTE — Progress Notes (Signed)
Patient ID: Dominique SalesJanay J Garza, female   DOB: 1985/08/19, 29 y.o.   MRN: [id number]   This chart was scribed for Dominique BurrowJohn Zylah Elsbernd V, MD by Carl Bestelina Holson, ED Scribe. This patient was seen in Room 2 and the patient's care was started at 11:22 AM.   [id number] 6137w6d Estimated Date of Delivery: 09/21/14  Blood pressure 134/80, pulse 64, weight 224 lb (101.606 kg), last menstrual period 01/03/2014.   refer to the ob flow sheet for FH and FHR, also BP, Wt, Urine results:negative  Patient reports +good fetal movement, denies any bleeding and no rupture of membranes symptoms or regular contractions. Patient complaints: She states that she noticed a gush of fluid come from her vagina this morning and suspects her water may have broken.  She has been having contractions every 15-20 minutes.  She is complaining of right sided abdominal pain.  She would like the Nexplanon implant for birth control after her delivery.    Physical Exam: VAGINA: normal atrophic tissues  FHR - 128 FH - 38cm  Questions were answered. Assessment: 7137w6d, [id number]  Plan:  Continued routine obstetrical care,  F/u in 1 weeks  I personally performed the services described in this documentation, which was SCRIBED in my presence. The recorded information has been reviewed and considered accurate. It has been edited as necessary during review. Dominique BurrowFERGUSON,Dominique Garza V, MD

## 2014-08-30 NOTE — Progress Notes (Signed)
Workin to  [id number] 2241w6d Estimated Date of Delivery: 09/21/14  Blood pressure 134/80, pulse 64, weight 224 lb (101.606 kg), last menstrual period 01/03/2014.   refer to the ob flow sheet for FH and FHR, also BP, Wt, Urine results:notable for neg  Patient reports  Gush of liquid once, none since, contractions q 10, irregular still. good fetal movement, denies any bleeding and ?? rupture of membranes no bleeding symptoms or regular contractions. Patient complaints:as above. Wet prep negative for trich Fern test negative  Nitrazine ph 4-5 (neg) Questions were answered. Assessment: False labor. No evidence of srom Plan:  Continued routine obstetrical care, rechk 1 wk  F/u in 1 weeks for LROB  Patient ID: Dominique Garza, female   DOB: 1986-03-15, 29 y.o.   MRN: [id number]   This chart was scribed for Tilda BurrowJohn Mira Balon V, MD by Carl Bestelina Holson, ED Scribe. This patient was seen in Room 2 and the patient's care was started at 11:22 AM.   [id number] 2241w6d Estimated Date of Delivery: 09/21/14  Blood pressure 134/80, pulse 64, weight 224 lb (101.606 kg), last menstrual period 01/03/2014.   refer to the ob flow sheet for FH and FHR, also BP, Wt, Urine results:negative  Patient reports +good fetal movement, denies any bleeding and no rupture of membranes symptoms or regular contractions. Patient complaints: She states that she noticed a gush of fluid come from her vagina this morning and suspects her water may have broken.  She has been having contractions every 15-20 minutes.  She is complaining of right sided abdominal pain.  She would like the Nexplanon implant for birth control after her delivery.    Physical Exam: VAGINA: normal atrophic tissues  FHR - 128 FH - 38cm  Questions were answered. Assessment: 4241w6d, [id number] no evidence srom Plan:  Continued routine obstetrical care,  F/u in 1 weeks  I personally performed the services described in this documentation, which was SCRIBED in my presence. The  recorded information has been reviewed and considered accurate. It has been edited as necessary during review. Tilda BurrowFERGUSON,Exzavier Ruderman V, MD

## 2014-08-31 ENCOUNTER — Encounter: Admitting: Obstetrics & Gynecology

## 2014-08-31 ENCOUNTER — Other Ambulatory Visit

## 2014-08-31 LAB — GC/CHLAMYDIA PROBE AMP
CHLAMYDIA, DNA PROBE: NEGATIVE
Neisseria gonorrhoeae by PCR: NEGATIVE

## 2014-09-01 ENCOUNTER — Inpatient Hospital Stay (HOSPITAL_COMMUNITY): Admitting: Anesthesiology

## 2014-09-01 ENCOUNTER — Encounter (HOSPITAL_COMMUNITY): Payer: Self-pay | Admitting: *Deleted

## 2014-09-01 ENCOUNTER — Inpatient Hospital Stay (HOSPITAL_COMMUNITY)
Admission: AD | Admit: 2014-09-01 | Discharge: 2014-09-03 | DRG: 775 | Disposition: A | Discharge status: Home / Self Care | Source: Ambulatory Visit | Attending: Obstetrics and Gynecology | Admitting: Obstetrics and Gynecology

## 2014-09-01 DIAGNOSIS — Z3A37 37 weeks gestation of pregnancy: Secondary | ICD-10-CM | POA: Diagnosis present

## 2014-09-01 DIAGNOSIS — Z3493 Encounter for supervision of normal pregnancy, unspecified, third trimester: Secondary | ICD-10-CM

## 2014-09-01 DIAGNOSIS — O09291 Supervision of pregnancy with other poor reproductive or obstetric history, first trimester: Secondary | ICD-10-CM

## 2014-09-01 DIAGNOSIS — F5089 Other specified eating disorder: Secondary | ICD-10-CM

## 2014-09-01 DIAGNOSIS — O133 Gestational [pregnancy-induced] hypertension without significant proteinuria, third trimester: Principal | ICD-10-CM | POA: Diagnosis present

## 2014-09-01 DIAGNOSIS — Z3483 Encounter for supervision of other normal pregnancy, third trimester: Secondary | ICD-10-CM | POA: Diagnosis present

## 2014-09-01 LAB — URINALYSIS, ROUTINE W REFLEX MICROSCOPIC
Bilirubin Urine: NEGATIVE
GLUCOSE, UA: NEGATIVE mg/dL
HGB URINE DIPSTICK: NEGATIVE
Ketones, ur: NEGATIVE mg/dL
LEUKOCYTES UA: NEGATIVE
Nitrite: NEGATIVE
Protein, ur: NEGATIVE mg/dL
UROBILINOGEN UA: 0.2 mg/dL (ref 0.0–1.0)
pH: 6 (ref 5.0–8.0)

## 2014-09-01 LAB — CBC
HCT: 30 % — ABNORMAL LOW (ref 36.0–46.0)
HEMOGLOBIN: 10.3 g/dL — AB (ref 12.0–15.0)
MCH: 30.4 pg (ref 26.0–34.0)
MCHC: 34.3 g/dL (ref 30.0–36.0)
MCV: 88.5 fL (ref 78.0–100.0)
Platelets: 132 10*3/uL — ABNORMAL LOW (ref 150–400)
RBC: 3.39 MIL/uL — ABNORMAL LOW (ref 3.87–5.11)
RDW: 14.5 % (ref 11.5–15.5)
WBC: 8.5 10*3/uL (ref 4.0–10.5)

## 2014-09-01 LAB — PROTEIN / CREATININE RATIO, URINE
Creatinine, Urine: 34 mg/dL
Total Protein, Urine: <6 mg/dL

## 2014-09-01 LAB — COMPREHENSIVE METABOLIC PANEL
ALK PHOS: 96 U/L (ref 39–117)
ALT: 13 U/L (ref 0–35)
AST: 19 U/L (ref 0–37)
Albumin: 3 g/dL — ABNORMAL LOW (ref 3.5–5.2)
Anion gap: 7 (ref 5–15)
BUN: 6 mg/dL (ref 6–23)
CO2: 24 mmol/L (ref 19–32)
Calcium: 8.9 mg/dL (ref 8.4–10.5)
Chloride: 106 mmol/L (ref 96–112)
Creatinine, Ser: 0.69 mg/dL (ref 0.50–1.10)
GLUCOSE: 86 mg/dL (ref 70–99)
Potassium: 3.5 mmol/L (ref 3.5–5.1)
SODIUM: 137 mmol/L (ref 135–145)
Total Bilirubin: 0.3 mg/dL (ref 0.3–1.2)
Total Protein: 5.9 g/dL — ABNORMAL LOW (ref 6.0–8.3)

## 2014-09-01 LAB — TYPE AND SCREEN
ABO/RH(D): A POS
Antibody Screen: NEGATIVE

## 2014-09-01 LAB — ABO/RH: ABO/RH(D): A POS

## 2014-09-01 MED ORDER — FLEET ENEMA 7-19 GM/118ML RE ENEM: 1.0000 | ENEMA | RECTAL | Status: DC | PRN

## 2014-09-01 MED ORDER — LACTATED RINGERS IV SOLN
INTRAVENOUS | Status: DC
  Administered 2014-09-01 (×2): via INTRAVENOUS

## 2014-09-01 MED ORDER — TERBUTALINE SULFATE 1 MG/ML IJ SOLN: 0.2500 mg | Freq: Once | INTRAMUSCULAR | Status: AC | PRN

## 2014-09-01 MED ORDER — LIDOCAINE-EPINEPHRINE (PF) 2 %-1:200000 IJ SOLN
INTRAMUSCULAR | Status: DC | PRN
  Administered 2014-09-01: 3 mL

## 2014-09-01 MED ORDER — FENTANYL CITRATE (PF) 100 MCG/2ML IJ SOLN
100.0000 ug | INTRAMUSCULAR | Status: DC | PRN
  Administered 2014-09-01: 100 ug via INTRAVENOUS
  Filled 2014-09-01: qty 2

## 2014-09-01 MED ORDER — HYDROXYZINE HCL 50 MG PO TABS
50.0000 mg | ORAL_TABLET | Freq: Four times a day (QID) | ORAL | Status: DC | PRN
  Filled 2014-09-01: qty 1

## 2014-09-01 MED ORDER — OXYCODONE-ACETAMINOPHEN 5-325 MG PO TABS: 1.0000 | ORAL_TABLET | ORAL | Status: DC | PRN

## 2014-09-01 MED ORDER — OXYTOCIN 40 UNITS IN LACTATED RINGERS INFUSION - SIMPLE MED
1.0000 m[IU]/min | INTRAVENOUS | Status: DC
  Administered 2014-09-01: 2 m[IU]/min via INTRAVENOUS

## 2014-09-01 MED ORDER — ONDANSETRON HCL 4 MG/2ML IJ SOLN: 4.0000 mg | Freq: Four times a day (QID) | INTRAMUSCULAR | Status: DC | PRN

## 2014-09-01 MED ORDER — FENTANYL 2.5 MCG/ML BUPIVACAINE 1/10 % EPIDURAL INFUSION (WH - ANES)
14.0000 mL/h | INTRAMUSCULAR | Status: DC | PRN
Start: 2014-09-01 — End: 2014-09-02
  Administered 2014-09-01: 12 mL/h via EPIDURAL
  Administered 2014-09-01: 14 mL/h via EPIDURAL
  Filled 2014-09-01: qty 125

## 2014-09-01 MED ORDER — OXYCODONE-ACETAMINOPHEN 5-325 MG PO TABS: 2.0000 | ORAL_TABLET | ORAL | Status: DC | PRN

## 2014-09-01 MED ORDER — BUPIVACAINE HCL (PF) 0.25 % IJ SOLN
INTRAMUSCULAR | Status: DC | PRN
  Administered 2014-09-01 (×2): 4 mL via EPIDURAL

## 2014-09-01 MED ORDER — OXYTOCIN BOLUS FROM INFUSION
500.0000 mL | INTRAVENOUS | Status: DC
  Administered 2014-09-02: 500 mL via INTRAVENOUS

## 2014-09-01 MED ORDER — PHENYLEPHRINE 40 MCG/ML (10ML) SYRINGE FOR IV PUSH (FOR BLOOD PRESSURE SUPPORT)
80.0000 ug | PREFILLED_SYRINGE | INTRAVENOUS | Status: DC | PRN
  Filled 2014-09-01: qty 2

## 2014-09-01 MED ORDER — OXYTOCIN 40 UNITS IN LACTATED RINGERS INFUSION - SIMPLE MED
62.5000 mL/h | INTRAVENOUS | Status: DC
  Filled 2014-09-01: qty 1000

## 2014-09-01 MED ORDER — PHENYLEPHRINE 40 MCG/ML (10ML) SYRINGE FOR IV PUSH (FOR BLOOD PRESSURE SUPPORT)
80.0000 ug | PREFILLED_SYRINGE | INTRAVENOUS | Status: DC | PRN
  Filled 2014-09-01: qty 20
  Filled 2014-09-01: qty 2

## 2014-09-01 MED ORDER — LIDOCAINE HCL (PF) 1 % IJ SOLN
30.0000 mL | INTRAMUSCULAR | Status: DC | PRN
  Filled 2014-09-01: qty 30

## 2014-09-01 MED ORDER — LACTATED RINGERS IV SOLN
500.0000 mL | Freq: Once | INTRAVENOUS | Status: AC
  Administered 2014-09-01: 500 mL via INTRAVENOUS

## 2014-09-01 MED ORDER — EPHEDRINE 5 MG/ML INJ
10.0000 mg | INTRAVENOUS | Status: DC | PRN
  Filled 2014-09-01: qty 2

## 2014-09-01 MED ORDER — ACETAMINOPHEN 325 MG PO TABS
650.0000 mg | ORAL_TABLET | ORAL | Status: DC | PRN
Start: 2014-09-01 — End: 2014-09-02

## 2014-09-01 MED ORDER — LACTATED RINGERS IV SOLN: 500.0000 mL | INTRAVENOUS | Status: DC | PRN

## 2014-09-01 MED ORDER — DIPHENHYDRAMINE HCL 50 MG/ML IJ SOLN: 12.5000 mg | INTRAMUSCULAR | Status: DC | PRN

## 2014-09-01 MED ORDER — CITRIC ACID-SODIUM CITRATE 334-500 MG/5ML PO SOLN: 30.0000 mL | ORAL | Status: DC | PRN

## 2014-09-01 NOTE — Anesthesia Preprocedure Evaluation (Signed)
Anesthesia Evaluation  Patient identified by MRN, date of birth, ID band Patient awake    Reviewed: Allergy & Precautions, NPO status , Patient's Chart, lab work & pertinent test results  History of Anesthesia Complications Negative for: history of anesthetic complications  Airway Mallampati: III  TM Distance: >3 FB Neck ROM: Full    Dental  (+) Teeth Intact   Pulmonary neg pulmonary ROS,  breath sounds clear to auscultation        Cardiovascular hypertension, Rhythm:Regular     Neuro/Psych PSYCHIATRIC DISORDERS Anxiety  Neuromuscular disease    GI/Hepatic negative GI ROS, Neg liver ROS,   Endo/Other  Morbid obesity  Renal/GU negative Renal ROS     Musculoskeletal  (+) Fibromyalgia -  Abdominal   Peds  Hematology  (+) anemia ,   Anesthesia Other Findings   Reproductive/Obstetrics (+) Pregnancy                             Anesthesia Physical Anesthesia Plan  ASA: II  Anesthesia Plan: Epidural   Post-op Pain Management:    Induction:   Airway Management Planned:   Additional Equipment:   Intra-op Plan:   Post-operative Plan:   Informed Consent: I have reviewed the patients History and Physical, chart, labs and discussed the procedure including the risks, benefits and alternatives for the proposed anesthesia with the patient or authorized representative who has indicated his/her understanding and acceptance.     Plan Discussed with: Anesthesiologist  Anesthesia Plan Comments:         Anesthesia Quick Evaluation

## 2014-09-01 NOTE — MAU Note (Signed)
Pt states hx of pre-eclampsia. Was told when bp reaches 140/80 to come in. This am bp was 140/93. Headaches x2 weeks. Slight blurred vision. Increased swelling in hands and feet.

## 2014-09-01 NOTE — H&P (Signed)
Dominique Garza is a 29 y.o. [phone] female at [redacted]w[redacted]d by 9wk u/s, presenting for report of elevated bp's at home today, 140s/90s.  +recent headaches this week not r/b apap, although no ha currently. +seeing black dots in vision occ. Denies ruq/epigastric pain, n/v. Reports decreased fm this am, but better since here fetal movement, contractions: none today, vaginal bleeding: none, membranes: intact. Initiated prenatal care at [facility] @ 11.5 wks.   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  H/O IOL @ 36wks d/t pre-e w/ 2nd baby, has been on baby asa daily since 12wks this pregnancy. Other 2 pregnancies were term uncomplicated SVBs.   Past Medical History: Past Medical History  Diagnosis Date  . Fibromyalgia   . Pregnant 02/16/2014  . Anxiety   . HSV-2 seropositive     Past Surgical History: Past Surgical History  Procedure Laterality Date  . Tonsillectomy      Obstetrical History: OB History    Gravida Para Term Preterm AB TAB SAB Ectopic Multiple Living   Social History: History   Social History  . Marital Status: Married    Spouse Name: N/A  . Number of Children: N/A  . Years of Education: N/A   Social History Main Topics  . Smoking status: Never Smoker   . Smokeless tobacco: Never Used  . Alcohol Use: No  . Drug Use: No  . Sexual Activity: Yes    Birth Control/ Protection: None   Other Topics Concern  . None   Social History Narrative    Family History: Family History  Problem Relation Age of Onset  . Fibromyalgia Mother   . Sarcoidosis Mother   . Diabetes Paternal Grandmother   . Hypertension Father     Allergies: No Known Allergies  Prescriptions prior to admission  Medication Sig Dispense Refill Last Dose  . acyclovir (ZOVIRAX) 400 MG tablet Take 1 tablet (400 mg total) by mouth 3 (three) times daily. 90 tablet 2 08/31/2014 at Unknown time  . aspirin EC 81 MG tablet Take 81 mg by mouth at  bedtime.    08/31/2014 at Unknown time  . Prenatal Vit-Min-FA-Fish Oil ([employer] PRENATAL GUMMY PO) Take 1 tablet by mouth daily.   08/31/2014 at Unknown time     Review of Systems  Pertinent pos/neg as indicated in HPI    Blood pressure 147/94, pulse 86, temperature 97.9 F (36.6 C), temperature source Oral, resp. rate 18, height 5' 3.75" (1.619 m), weight 101.322 kg (223 lb 6 oz), last menstrual period 01/03/2014. General appearance: alert, cooperative and no distress Lungs: clear to auscultation bilaterally Heart: regular rate and rhythm Abdomen: gravid, soft, non-tender Extremities: 1+ edema DTR's 2+, no clonus  Fetal monitoring: FHR: 130 bpm, variability: moderate,  Accelerations: Present,  decelerations:  Absent Uterine activity: irregular, not perceived by pt  Dilation: 1.5 Effacement (%): Thick Station: -2 Exam by:: Joellyn Haff CNM Presentation: cephalic   Prenatal labs: ABO, Rh: A/POS/-- (10/28 1508) Antibody: Negative (02/18 0914) Rubella:  Immune RPR: Non Reactive (02/18 0914)  HBsAg: NEGATIVE (10/28 1508)  HIV: NONREACTIVE (10/28 1508)  GBS:   collected 4/20 in office, result pending  2 hr GTT: 65/112/100 Genetic screening:  Nt/it neg Anatomy US: normal female  Results for orders placed or performed during the hospital encounter of 09/01/14 (from the past 24 hour(s))  Protein / creatinine ratio, urine  Collection Time: 09/01/14 10:43 AM  Result Value Ref Range   Creatinine, Urine 34.00 mg/dL   Total Protein, Urine <6 mg/dL   Protein Creatinine Ratio        0.00 - 0.15  Urinalysis, Routine w reflex microscopic   Collection Time: 09/01/14 10:43 AM  Result Value Ref Range   Color, Urine YELLOW YELLOW   APPearance CLEAR CLEAR   Specific Gravity, Urine <1.005 (L) 1.005 - 1.030   pH 6.0 5.0 - 8.0   Glucose, UA NEGATIVE NEGATIVE mg/dL   Hgb urine dipstick NEGATIVE NEGATIVE   Bilirubin Urine NEGATIVE NEGATIVE   Ketones, ur NEGATIVE NEGATIVE mg/dL   Protein, ur  NEGATIVE NEGATIVE mg/dL   Urobilinogen, UA 0.2 0.0 - 1.0 mg/dL   Nitrite NEGATIVE NEGATIVE   Leukocytes, UA NEGATIVE NEGATIVE  CBC   Collection Time: 09/01/14 12:25 PM  Result Value Ref Range   WBC 8.5 4.0 - 10.5 K/uL   RBC 3.39 (L) 3.87 - 5.11 MIL/uL   Hemoglobin 10.3 (L) 12.0 - 15.0 g/dL   HCT 40.930.0 (L) 81.136.0 - 91.446.0 %   MCV 88.5 78.0 - 100.0 fL   MCH 30.4 26.0 - 34.0 pg   MCHC 34.3 30.0 - 36.0 g/dL   RDW 78.214.5 95.611.5 - 21.315.5 %   Platelets 132 (L) 150 - 400 K/uL  Comprehensive metabolic panel   Collection Time: 09/01/14 12:25 PM  Result Value Ref Range   Sodium 137 135 - 145 mmol/L   Potassium 3.5 3.5 - 5.1 mmol/L   Chloride 106 96 - 112 mmol/L   CO2 24 19 - 32 mmol/L   Glucose, Bld 86 70 - 99 mg/dL   BUN 6 6 - 23 mg/dL   Creatinine, Ser 0.860.69 0.50 - 1.10 mg/dL   Calcium 8.9 8.4 - 57.810.5 mg/dL   Total Protein 5.9 (L) 6.0 - 8.3 g/dL   Albumin 3.0 (L) 3.5 - 5.2 g/dL   AST 19 0 - 37 U/L   ALT 13 0 - 35 U/L   Alkaline Phosphatase 96 39 - 117 U/L   Total Bilirubin 0.3 0.3 - 1.2 mg/dL   GFR calc non Af Amer >90 >90 mL/min   GFR calc Af Amer >90 >90 mL/min   Anion gap 7 5 - 15     Assessment:  5020w1d SIUP  [id number]  GHTN w/o significant proteinuria  Cat 1 FHR  GBS  pending  Plan:  Admit to BS  IV pain meds/epidural prn active labor  Will place cervical foley bulb once settled on L&D  L&D to call [facility] to see if can get gbs results  Anticipate NSVB   Plans to breastfeed  Contraception: undecided, maybe IUD  Circumcision: n/a  Marge DuncansBooker, Eldin Bonsell Randall CNM, WHNP-BC 09/01/2014, 1:18 PM   1418: Cervical foley bulb inserted and inflated w/ 60ml LR w/o difficulty  Cheral MarkerKimberly R. Corin Formisano, CNM, [facility]NP-BC 09/01/2014 2:19 PM

## 2014-09-01 NOTE — Anesthesia Procedure Notes (Signed)
Epidural Patient location during procedure: OB  Staffing Anesthesiologist: Myrissa Chipley, CHRIS Performed by: anesthesiologist   Preanesthetic Checklist Completed: patient identified, surgical consent, pre-op evaluation, timeout performed, IV checked, risks and benefits discussed and monitors and equipment checked  Epidural Patient position: sitting Prep: site prepped and draped and DuraPrep Patient monitoring: heart rate, cardiac monitor, continuous pulse ox and blood pressure Approach: midline Location: L4-L5 Injection technique: LOR saline  Needle:  Needle type: Tuohy  Needle gauge: 17 G Needle length: 9 cm Needle insertion depth: 8 cm Catheter type: closed end flexible Catheter size: 19 Gauge Catheter at skin depth: 15 cm Test dose: negative and 2% lidocaine with Epi 1:200 K  Assessment Events: blood not aspirated, injection not painful, no injection resistance, negative IV test and no paresthesia  Additional Notes H+P and labs checked, risks and benefits discussed with the patient, consent obtained, procedure tolerated well and without complications.  Reason for block:procedure for pain   

## 2014-09-01 NOTE — Progress Notes (Signed)
Patient ID: Dominique SalesJanay J Garza, female   DOB: 1986-02-05, 29 y.o.   MRN: [id number] Dominique SalesJanay J Garza is a 29 y.o. [id number] at 4772w1d [facility]  Subjective: Comfortable w/ epidural  Objective: BP 149/81 mmHg  Pulse 65  Temp(Src) 98.5 F (36.9 C) (Oral)  Resp 18  Ht 5\' 3"  (1.6 m)  Wt 101.152 kg (223 lb)  BMI 39.51 kg/m2  LMP 01/03/2014    FHT:  FHR: 125 bpm, variability: moderate,  accelerations:  Present,  decelerations:  Absent UC:   regular, every 2-3 minutes  SVE:   Dilation: 4.5 Effacement (%): Thick Station: -2, -3 Exam by:: L. McDaniel RN  Foley bulb out  Pitocin @ 2 mu/min, started at 1900  Labs: Lab Results  Component Value Date   WBC 8.5 09/01/2014   HGB 10.3* 09/01/2014   HCT 30.0* 09/01/2014   MCV 88.5 09/01/2014   PLT 132* 09/01/2014    Assessment / Plan: IOL d/t GHTN, foley bulb now out and pitocin started  Labor: Progressing normally Fetal Wellbeing:  Category I Pain Control:  Epidural Pre-eclampsia: bp & labs stable I/D:  n/a Anticipated MOD:  NSVD  Marge DuncansBooker, Jantzen Pilger Randall CNM, WHNP-BC 09/01/2014, 7:43 PM

## 2014-09-01 NOTE — Progress Notes (Addendum)
Dominique Garza is a 29 y.o. [id number] at 7157w1d by ultrasound admitted for induction of labor due to Hypertension.  Subjective: Patient reports that she is feeling the contractions more around her navel.  Describes this pain as sharp.  Objective: BP 149/78 mmHg  Pulse 69  Temp(Src) 98.5 F (36.9 C) (Oral)  Resp 18  Ht 5\' 3"  (1.6 m)  Wt 223 lb (101.152 kg)  BMI 39.51 kg/m2  LMP 01/03/2014     Gen: awake, alert, well appearing, NAD FHT:  FHR: 125 bpm, variability: moderate,  accelerations:  Present,  decelerations:  Absent UC:   regular, every 2-3 minutes SVE:   Dilation: 4.5 Effacement (%): Thick Station: -2, -3 Exam by:: Tressia DanasL. McDaniel RN  2042 CE: 4.5/thick/-2  Labs: Lab Results  Component Value Date   WBC 8.5 09/01/2014   HGB 10.3* 09/01/2014   HCT 30.0* 09/01/2014   MCV 88.5 09/01/2014   PLT 132* 09/01/2014    Assessment / Plan: Induction of labor due to gestational hypertension,  progressing well on pitocin  Labor: Progressing on Pitocin Fetal Wellbeing:  Category I Pain Control:  Epidural I/D:  n/a Anticipated MOD:  NSVD  Raliegh IpGottschalk, Ashly M, DO 09/01/2014, 8:42 PM

## 2014-09-02 ENCOUNTER — Encounter (HOSPITAL_COMMUNITY): Payer: Self-pay | Admitting: *Deleted

## 2014-09-02 DIAGNOSIS — O133 Gestational [pregnancy-induced] hypertension without significant proteinuria, third trimester: Secondary | ICD-10-CM

## 2014-09-02 DIAGNOSIS — Z3A37 37 weeks gestation of pregnancy: Secondary | ICD-10-CM

## 2014-09-02 LAB — CBC
HCT: 30.7 % — ABNORMAL LOW (ref 36.0–46.0)
Hemoglobin: 10.4 g/dL — ABNORMAL LOW (ref 12.0–15.0)
MCH: 29.7 pg (ref 26.0–34.0)
MCHC: 33.9 g/dL (ref 30.0–36.0)
MCV: 87.7 fL (ref 78.0–100.0)
PLATELETS: 128 10*3/uL — AB (ref 150–400)
RBC: 3.5 MIL/uL — ABNORMAL LOW (ref 3.87–5.11)
RDW: 14.5 % (ref 11.5–15.5)
WBC: 15.2 10*3/uL — AB (ref 4.0–10.5)

## 2014-09-02 LAB — RPR: RPR: NONREACTIVE

## 2014-09-02 LAB — HIV ANTIBODY (ROUTINE TESTING W REFLEX): HIV Screen 4th Generation wRfx: NONREACTIVE

## 2014-09-02 LAB — CULTURE, BETA STREP (GROUP B ONLY): Strep Gp B Culture: NEGATIVE

## 2014-09-02 MED ORDER — WITCH HAZEL-GLYCERIN EX PADS: 1.0000 "application " | MEDICATED_PAD | CUTANEOUS | Status: DC | PRN

## 2014-09-02 MED ORDER — DIPHENHYDRAMINE HCL 25 MG PO CAPS: 25.0000 mg | ORAL_CAPSULE | Freq: Four times a day (QID) | ORAL | Status: DC | PRN

## 2014-09-02 MED ORDER — ACETAMINOPHEN 325 MG PO TABS
650.0000 mg | ORAL_TABLET | ORAL | Status: DC | PRN
  Administered 2014-09-02: 650 mg via ORAL
  Filled 2014-09-02: qty 2

## 2014-09-02 MED ORDER — OXYCODONE-ACETAMINOPHEN 5-325 MG PO TABS: 1.0000 | ORAL_TABLET | ORAL | Status: DC | PRN

## 2014-09-02 MED ORDER — OXYCODONE-ACETAMINOPHEN 5-325 MG PO TABS: 2.0000 | ORAL_TABLET | ORAL | Status: DC | PRN

## 2014-09-02 MED ORDER — AMLODIPINE BESYLATE 10 MG PO TABS
10.0000 mg | ORAL_TABLET | Freq: Every day | ORAL | Status: DC
  Administered 2014-09-02 – 2014-09-03 (×2): 10 mg via ORAL
  Filled 2014-09-02 (×3): qty 1

## 2014-09-02 MED ORDER — ONDANSETRON HCL 4 MG/2ML IJ SOLN: 4.0000 mg | INTRAMUSCULAR | Status: DC | PRN

## 2014-09-02 MED ORDER — LANOLIN HYDROUS EX OINT
TOPICAL_OINTMENT | CUTANEOUS | Status: DC | PRN
Start: 2014-09-02 — End: 2014-09-03

## 2014-09-02 MED ORDER — PRENATAL MULTIVITAMIN CH
1.0000 | ORAL_TABLET | Freq: Every day | ORAL | Status: DC
  Administered 2014-09-02 – 2014-09-03 (×2): 1 via ORAL
  Filled 2014-09-02 (×2): qty 1

## 2014-09-02 MED ORDER — TETANUS-DIPHTH-ACELL PERTUSSIS 5-2.5-18.5 LF-MCG/0.5 IM SUSP
0.5000 mL | Freq: Once | INTRAMUSCULAR | Status: AC
  Administered 2014-09-02: 0.5 mL via INTRAMUSCULAR
  Filled 2014-09-02: qty 0.5

## 2014-09-02 MED ORDER — SENNOSIDES-DOCUSATE SODIUM 8.6-50 MG PO TABS
2.0000 | ORAL_TABLET | ORAL | Status: DC
  Administered 2014-09-02: 2 via ORAL
  Filled 2014-09-02: qty 2

## 2014-09-02 MED ORDER — BENZOCAINE-MENTHOL 20-0.5 % EX AERO
1.0000 "application " | INHALATION_SPRAY | CUTANEOUS | Status: DC | PRN
  Administered 2014-09-02: 1 via TOPICAL
  Filled 2014-09-02: qty 56

## 2014-09-02 MED ORDER — ZOLPIDEM TARTRATE 5 MG PO TABS: 5.0000 mg | ORAL_TABLET | Freq: Every evening | ORAL | Status: DC | PRN

## 2014-09-02 MED ORDER — SIMETHICONE 80 MG PO CHEW: 80.0000 mg | CHEWABLE_TABLET | ORAL | Status: DC | PRN

## 2014-09-02 MED ORDER — DIBUCAINE 1 % RE OINT: 1.0000 "application " | TOPICAL_OINTMENT | RECTAL | Status: DC | PRN

## 2014-09-02 MED ORDER — IBUPROFEN 600 MG PO TABS
600.0000 mg | ORAL_TABLET | Freq: Four times a day (QID) | ORAL | Status: DC
  Administered 2014-09-02 – 2014-09-03 (×6): 600 mg via ORAL
  Filled 2014-09-02 (×6): qty 1

## 2014-09-02 MED ORDER — ONDANSETRON HCL 4 MG PO TABS: 4.0000 mg | ORAL_TABLET | ORAL | Status: DC | PRN

## 2014-09-02 NOTE — Anesthesia Postprocedure Evaluation (Signed)
Anesthesia Post Note  Patient: Dominique Garza  Procedure(s) Performed: * No procedures listed *  Anesthesia type: Epidural  Patient location: Mother/Baby  Post pain: Pain level controlled  Post assessment: Post-op Vital signs reviewed  Last Vitals:  Filed Vitals:   09/02/14 0412  BP: 143/85  Pulse: 72  Temp: 36.9 C  Resp: 18    Post vital signs: Reviewed  Level of consciousness:alert  Complications: No apparent anesthesia complications

## 2014-09-02 NOTE — Progress Notes (Signed)
CLINICAL SOCIAL WORK MATERNAL/CHILD NOTE  Patient Details  Name: Dominique Garza MRN: [id number] Date of Birth: 09/02/2013  Date: 09/02/2014  Clinical Social Worker Initiating Note: Phyllis Whitefield, LCSWDate/ Time Initiated: 09/02/14/1130   Child's Name: Dominique Garza   Legal Guardian:  (Parents)   Need for Interpreter: None   Date of Referral: 09/01/14   Reason for Referral:  (Hx of anxiety)   Referral Source: [facility]   Address: [address] [place], [place] [address]  Phone number:  [phone])   Household Members: Self, Minor Children   Natural Supports (not living in the home): Extended Family, Spouse/significant other   Professional Supports:None   Employment: ([employer] is employed)   Type of Work: n/a   Education:  (n/a)   Printmaker   Other Resources: Theatre stage manager Considerations Which May Impact Care: none identified  Strengths: Ability to meet basic needs , Compliance with medical plan , Home prepared for child    Risk Factors/Current Problems: None   Cognitive State: Alert , Able to Concentrate    Mood/Affect: Bright , Happy , Calm    CSW Assessment: Acknowledged order for social work consult to assess mother's hx of anxiety. Met with mother who was pleasant and receptive to social work. Informed that she is married but legally separated and spouse is not the father of this child. She has 3 other dependents ages 2,4, and 20. Mother reports hx of panic attack usually when she is stressed. Informed that they are infrequent and last incident was about 10 months. Informed that she has never sought treatment and never prescribed medication. Informed that she is usually able to use breathing techniques to manage the panic attacks. She denies current symptoms of anxiety or depression and reports no hx of SA. She reports adequate support from  family. No acute social concerns noted or reported at this time. Mother informed of social work Fish farm manager.  CSW Plan/Description:    Patient/Family Education - PP Depression information and resources No further intervention required No barriers to discharge  Dominique Garza J, LCSW 09/02/2014, 4:08 PM

## 2014-09-02 NOTE — Lactation Note (Signed)
This note was copied from the chart of Dominique Ashby DawesJanay Kratzke. Lactation Consultation Note  Patient Name: Dominique Garza ZOXWR'UToday's Date: 09/02/2014 Reason for consult: Initial assessment Baby 19 hours of life. Mom is an experience breastfeeding mother of 3. Mom holding baby STS when LC entered room. Mom states that she prefers to breast and formula-feed this infant. Mom states that she will probably do some pumping when she gets home, but is fine to nurse and formula-feed in hospital. Discussed supply and demand and enc mom to put baby to breast first with each feeding. Mom given hand pump with instructions. Mom states that baby is latching well and her breast are starting to fill.   Mom given [facility] brochure, aware of OP/BFSG, community resources, and [facility] phone line assistance after D/C. Enc mom to call for assistance as needed.    Maternal Data Has patient been taught Hand Expression?: Yes (per mom.) Does the patient have breastfeeding experience prior to this delivery?: Yes  Feeding Feeding Type:  (ENCOURAGED MOTHER TO LATCH AND SUPPLEMENT)  LATCH Score/Interventions                      Lactation Tools Discussed/Used     Consult Status Consult Status: PRN    Geralynn OchsWILLIARD, Hannah Strader 09/02/2014, 7:44 PM

## 2014-09-02 NOTE — Progress Notes (Signed)
Repeat BP 146/95.  Pt asymptomatic.  MD on call, Wyvonnia DuskyMarie Lawson, notified.  Instructed RN to place order for Norvasc 10 mg PO daily with the first dose to be given now.  Will continue to monitor.

## 2014-09-02 NOTE — Progress Notes (Signed)
**  Interval note**  Called by RN to inform of elevation in BP to 143/85.  Patient asymptomatic.  Denies headache, vision changes, abdominal pain.  Endorses some mild LE swelling.  On exam, patient well appearing, NAD, no increased WOB, mild pedal edema appreciated.  Patient on no antihypertensives presently.  Discussed with RN to please notify of BPs in severe range or if patient reports severe features.  Will continue to monitor.  Dominique Riches M. Nadine CountsGottschalk, DO PGY-1, [facility]

## 2014-09-03 ENCOUNTER — Encounter: Admitting: Obstetrics and Gynecology

## 2014-09-03 MED ORDER — AMLODIPINE BESYLATE 10 MG PO TABS: 10.0000 mg | ORAL_TABLET | Freq: Every day | ORAL | Status: DC

## 2014-09-03 MED ORDER — OXYCODONE-ACETAMINOPHEN 5-325 MG PO TABS: 1.0000 | ORAL_TABLET | ORAL | Status: DC | PRN

## 2014-09-03 MED ORDER — IBUPROFEN 600 MG PO TABS: 600.0000 mg | ORAL_TABLET | Freq: Four times a day (QID) | ORAL | Status: DC

## 2014-09-03 NOTE — Discharge Summary (Signed)
Obstetric Discharge Summary Reason for Admission: induction of labor Prenatal Procedures: ultrasound Intrapartum Procedures: spontaneous vaginal delivery Postpartum Procedures: none Complications-Operative and Postpartum: none HEMOGLOBIN  Date Value Ref Range Status  09/02/2014 10.4* 12.0 - 15.0 g/dL Final   HCT  Date Value Ref Range Status  09/02/2014 30.7* 36.0 - 46.0 % Final    Physical Exam:  General: alert, cooperative, appears stated age and no distress Lochia: appropriate Uterine Fundus: firm Incision: n/a DVT Evaluation: No evidence of DVT seen on physical exam. Negative Homan's sign. No cords or calf tenderness. No significant calf/ankle edema.  Discharge Diagnoses: Term Pregnancy-delivered  Discharge Information: Date: 09/03/2014 Activity: pelvic rest Diet: routine Medications: PNV, Ibuprofen and Percocet Condition: stable and improved Instructions: refer to practice specific booklet Discharge to: home   Newborn Data: Live born female  Birth Weight: 5 lb 15.8 oz (2715 g) APGAR: 9, 9  Home with mother.  Dominique Garza DARLENE 09/03/2014, 7:11 AM

## 2014-09-04 ENCOUNTER — Encounter: Admitting: Women's Health

## 2014-10-15 ENCOUNTER — Encounter: Payer: Self-pay | Admitting: Adult Health

## 2014-10-15 ENCOUNTER — Ambulatory Visit: Admitting: Women's Health

## 2014-10-15 ENCOUNTER — Ambulatory Visit (INDEPENDENT_AMBULATORY_CARE_PROVIDER_SITE_OTHER): Admitting: Adult Health

## 2014-10-15 ENCOUNTER — Ambulatory Visit: Admitting: Adult Health

## 2014-10-15 DIAGNOSIS — Z309 Encounter for contraceptive management, unspecified: Secondary | ICD-10-CM

## 2014-10-15 DIAGNOSIS — Z30018 Encounter for initial prescription of other contraceptives: Secondary | ICD-10-CM

## 2014-10-15 DIAGNOSIS — Z1331 Encounter for screening for depression: Secondary | ICD-10-CM

## 2014-10-15 HISTORY — DX: Encounter for contraceptive management, unspecified: Z30.9

## 2014-10-15 NOTE — Progress Notes (Signed)
Patient ID: Dominique Garza, female   DOB: 1985/11/25, 29 y.o.   MRN: [id number] Dominique ConceptionJanay is a 29 year old black female in for a postpartum visit.She was induced at 37 weeks for elevated BP and swelling. She is taking Norvasc.  Delivery Date:09/02/14  Method of Delivery: Vaginal delivery of baby girl, Janiyah, 5 lbs 15 oz.  Sexual Activity since delivery: Yes, without condom ,the first of May  Method of Feeding: Breast and bottle  Number of weeks bleeding post delivery: Less than 1 week, like 2 days  Review of Systems: Patient denies any headaches, hearing loss, fatigue, blurred vision, shortness of breath, chest pain, abdominal pain, problems with bowel movements, urination, or intercourse. No joint pain or mood swings. Reviewed past medical,surgical, social and family history. Reviewed medications and allergies.   Depression Score: 4 BP 130/80 mmHg  Pulse 64  Ht 5\' 4"  (1.626 m)  Wt 200 lb (90.719 kg)  BMI 34.31 kg/m2  Breastfeeding? Yes Pelvic Exam:   External genitalia is normal in appearance, no lesions.  The vagina has good color, moisture and rugae, no lesions.Urethra has no masses or tenderness noted. The cervix is bulbous.  Uterus is felt to be normal size, shape, and contour, well involuted.  No adnexal masses or tenderness noted.Bladder is non tender, no masses felt.No hemorrhoids seen. She wants to get nexplanon, so no sex for 2 weeks, then come in, in 2 weeks for stat [facility] in am and then nexplanon insertion in pm.Will order nexplanon.  Impression:  Status post delivery, post partum check, depression screening, contraceptive management.   Plan:   No sex for 2 weeks Return in 2 weeks for stat [facility] in am and nexplanon insertion in pm Review handout on nexplanon

## 2014-10-15 NOTE — Patient Instructions (Signed)
NO sex til after nexplanon Return in 2 weeks for stat [facility] in am and nexplanon insertion in pm

## 2014-10-20 ENCOUNTER — Emergency Department (HOSPITAL_COMMUNITY)
Admission: EM | Admit: 2014-10-20 | Discharge: 2014-10-20 | Disposition: A | Discharge status: Home / Self Care | Attending: Emergency Medicine | Admitting: Emergency Medicine

## 2014-10-20 ENCOUNTER — Encounter (HOSPITAL_COMMUNITY): Payer: Self-pay | Admitting: Emergency Medicine

## 2014-10-20 DIAGNOSIS — G44219 Episodic tension-type headache, not intractable: Secondary | ICD-10-CM | POA: Insufficient documentation

## 2014-10-20 DIAGNOSIS — Z8739 Personal history of other diseases of the musculoskeletal system and connective tissue: Secondary | ICD-10-CM | POA: Diagnosis not present

## 2014-10-20 DIAGNOSIS — R51 Headache: Secondary | ICD-10-CM | POA: Diagnosis present

## 2014-10-20 DIAGNOSIS — Z79899 Other long term (current) drug therapy: Secondary | ICD-10-CM | POA: Insufficient documentation

## 2014-10-20 DIAGNOSIS — Z8659 Personal history of other mental and behavioral disorders: Secondary | ICD-10-CM | POA: Insufficient documentation

## 2014-10-20 DIAGNOSIS — I10 Essential (primary) hypertension: Secondary | ICD-10-CM | POA: Insufficient documentation

## 2014-10-20 HISTORY — DX: Essential (primary) hypertension: I10

## 2014-10-20 MED ORDER — DIPHENHYDRAMINE HCL 50 MG/ML IJ SOLN
25.0000 mg | Freq: Once | INTRAMUSCULAR | Status: DC
  Filled 2014-10-20: qty 1

## 2014-10-20 MED ORDER — SODIUM CHLORIDE 0.9 % IV BOLUS (SEPSIS): 1000.0000 mL | Freq: Once | INTRAVENOUS | Status: DC

## 2014-10-20 MED ORDER — ASPIRIN EC 325 MG PO TBEC
975.0000 mg | DELAYED_RELEASE_TABLET | Freq: Once | ORAL | Status: AC
  Administered 2014-10-20: 975 mg via ORAL
  Filled 2014-10-20: qty 3

## 2014-10-20 MED ORDER — METOCLOPRAMIDE HCL 5 MG/ML IJ SOLN
10.0000 mg | Freq: Once | INTRAMUSCULAR | Status: DC
  Filled 2014-10-20: qty 2

## 2014-10-20 NOTE — ED Provider Notes (Signed)
CSN: [id number]     Arrival date & time 10/20/14  1843 History   First MD Initiated Contact with Patient 10/20/14 1904     Chief Complaint  Patient presents with  . Headache    The patient said she started feeling faint at about 1400hrs.  She says she just does not feel right.  Its like she feels "foggy".     (Consider location/radiation/quality/duration/timing/severity/associated sxs/prior Treatment) Patient is a 29 y.o. female presenting with headaches. The history is provided by the patient.  Headache Pain location:  R temporal Quality:  Dull Radiates to:  Does not radiate Onset quality:  Gradual Duration:  2 days Timing:  Intermittent Progression:  Waxing and waning Chronicity:  New Similar to prior headaches: yes   Context: emotional stress   Context: not exposure to bright light and not loud noise   Relieved by:  Nothing Worsened by:  Nothing Ineffective treatments:  None tried Associated symptoms: no fever, no focal weakness, no loss of balance, no photophobia, no syncope and no vomiting   Associated symptoms comment:  Light-headed Risk factors: insomnia (has newborn at home)   Risk factors: no family hx of [facility]     Past Medical History  Diagnosis Date  . Fibromyalgia   . Pregnant 02/16/2014  . Anxiety   . HSV-2 seropositive   . Postpartum examination following vaginal delivery 10/15/2014  . Contraceptive management 10/15/2014  . Hypertension    Past Surgical History  Procedure Laterality Date  . Tonsillectomy     Family History  Problem Relation Age of Onset  . Fibromyalgia Mother   . Sarcoidosis Mother   . Diabetes Paternal Grandmother   . Hypertension Father    History  Substance Use Topics  . Smoking status: Never Smoker   . Smokeless tobacco: Never Used  . Alcohol Use: No   OB History    Gravida Para Term Preterm AB TAB SAB Ectopic Multiple Living   5 4 3 1 1  1   0 4     Review of Systems  Constitutional: Negative for fever.  Eyes: Negative for  photophobia.  Cardiovascular: Negative for syncope.  Gastrointestinal: Negative for vomiting.  Neurological: Positive for headaches. Negative for focal weakness and loss of balance.  All other systems reviewed and are negative.     Allergies  Review of patient's allergies indicates no known allergies.  Home Medications   Prior to Admission medications   Medication Sig Start Date End Date Taking? Authorizing Provider  acyclovir (ZOVIRAX) 400 MG tablet Take 1 tablet (400 mg total) by mouth 3 (three) times daily. 08/02/14   Jacklyn Shell, CNM  amLODipine (NORVASC) 10 MG tablet Take 1 tablet (10 mg total) by mouth daily. 09/03/14   Montez Morita, CNM  ibuprofen (ADVIL,MOTRIN) 600 MG tablet Take 1 tablet (600 mg total) by mouth every 6 (six) hours. Patient taking differently: Take 600 mg by mouth daily.  09/03/14   Montez Morita, CNM  Prenatal Vit-Fe Fumarate-FA (PRENATAL VITAMIN PO) Take by mouth daily.    Historical Provider, MD   BP 131/81 mmHg  Pulse 84  Temp(Src) 98.6 F (37 C) (Oral)  Resp 16  Ht 5\' 4"  (1.626 m)  Wt 200 lb (90.719 kg)  BMI 34.31 kg/m2  SpO2 99% Physical Exam  Constitutional: She is oriented to person, place, and time. She appears well-developed and well-nourished. No distress.  HENT:  Head: Normocephalic.  Eyes: Conjunctivae are normal.  Neck: Neck supple. No tracheal deviation present.  Cardiovascular: Normal rate and regular rhythm.   Pulmonary/Chest: Effort normal. No respiratory distress.  Abdominal: Soft. She exhibits no distension.  Neurological: She is alert and oriented to person, place, and time. She has normal strength. No cranial nerve deficit or sensory deficit. Coordination and gait normal. GCS eye subscore is 4. GCS verbal subscore is 5. GCS motor subscore is 6.  Skin: Skin is warm and dry.  Psychiatric: She has a normal mood and affect.    ED Course  Procedures (including critical care time) Labs Review Labs Reviewed  POC  URINE PREG, ED    Imaging Review No results found.   EKG Interpretation None      MDM   Final diagnoses:  None    29 year old female presents with headache that has been occurring intermittently over the last 3 days. It is mostly right sided, is not accompanied by any neurologic symptoms outside of pain, and has been associated with interrupted sleep pattern due to recent newborn feeding and being fussy at night. She states the headache only comes on when she thinks about it and that she presented here today because she was worried her blood pressure was to blame.  The patient has taken iron Profen with limited efficacy. She is not febrile, has no meningeal signs, did not have a sudden onset or other suggestive features of subarachnoid, and has had no trauma to her head.  Patient was given high-dose of aspirin to help relieve tension headache versus atypical migraine and will be discharged as she has no high-risk features on today's presentation. Plan to follow up with PCP as needed and return precautions discussed for worsening or new concerning symptoms.  Lyndal Pulley, MD 10/20/14 1940  Eber Hong, MD 10/22/14 [phone]

## 2014-10-20 NOTE — Discharge Instructions (Signed)

## 2014-10-20 NOTE — ED Notes (Signed)
Reviewed that urine sample is needed, patient states no need to void at this time.

## 2014-10-20 NOTE — ED Notes (Signed)
Verified dosage with Dr. Clydene Pugh.

## 2014-10-20 NOTE — ED Notes (Signed)
The patient said she started feeling faint at about 1400hrs.  She says she just does not feel right.  Its like she feels "foggy".  She rates her pain 6/10.  She denies any other symptoms.

## 2014-10-29 ENCOUNTER — Other Ambulatory Visit

## 2014-10-29 ENCOUNTER — Encounter: Admitting: Adult Health

## 2014-11-20 ENCOUNTER — Ambulatory Visit (INDEPENDENT_AMBULATORY_CARE_PROVIDER_SITE_OTHER): Admitting: Women's Health

## 2014-11-20 ENCOUNTER — Encounter: Payer: Self-pay | Admitting: Women's Health

## 2014-11-20 VITALS — BP 138/98 | HR 84 | Ht 64.0 in | Wt 202.0 lb

## 2014-11-20 DIAGNOSIS — Z30018 Encounter for initial prescription of other contraceptives: Secondary | ICD-10-CM | POA: Diagnosis not present

## 2014-11-20 DIAGNOSIS — Z30017 Encounter for initial prescription of implantable subdermal contraceptive: Secondary | ICD-10-CM

## 2014-11-20 DIAGNOSIS — Z975 Presence of (intrauterine) contraceptive device: Secondary | ICD-10-CM

## 2014-11-20 DIAGNOSIS — O165 Unspecified maternal hypertension, complicating the puerperium: Secondary | ICD-10-CM

## 2014-11-20 DIAGNOSIS — Z3202 Encounter for pregnancy test, result negative: Secondary | ICD-10-CM

## 2014-11-20 LAB — POCT URINE PREGNANCY: Preg Test, Ur: NEGATIVE

## 2014-11-20 NOTE — Progress Notes (Signed)
Patient ID: Dominique SalesJanay J Longwell, female   DOB: Mar 17, 1986, 29 y.o.   MRN: [id number] Dominique Garza is a 29 y.o. year old African American female here for Nexplanon insertion.  Patient's last menstrual period was 11/04/2014., last sexual intercourse was 5/30, and her pregnancy test today was negative.  Risks/benefits/side effects of Nexplanon have been discussed and her questions have been answered.  Specifically, a failure rate of 05/998 has been reported, with an increased failure rate if pt takes St. John's Wort and/or antiseizure medicaitons.  Dominique SalesJanay J Savarino is aware of the common side effect of irregular bleeding, which the incidence of decreases over time.  She is on norvasc 10mg  daily for continued pp HTN after GHTN during pregnancy. She forgot to take her dose last night. She is 11wks pp now.   BP 138/98 mmHg  Pulse 84  Ht 5\' 4"  (1.626 m)  Wt 202 lb (91.627 kg)  BMI 34.66 kg/m2  LMP 11/04/2014  Breastfeeding? No  Results for orders placed or performed in visit on 11/20/14 (from the past 24 hour(s))  POCT urine pregnancy   Collection Time: 11/20/14  8:55 AM  Result Value Ref Range   Preg Test, Ur Negative Negative     She is right-handed, so her left arm, approximately 4 inches proximal from the elbow, was cleansed with alcohol and anesthetized with 2cc of 2% Lidocaine.  The area was cleansed again with betadine and the Nexplanon was inserted per manufacturer's recommendations without difficulty.  A steri-strip and pressure bandage were applied.  Pt was instructed to keep the area clean and dry, remove pressure bandage in 24 hours, and keep insertion site covered with the steri-strip for 3-5 days.  Back up contraception was recommended for 2 weeks.  She was given a card indicating date Nexplanon was inserted and date it needs to be removed.  [place] was in [place] last year, will request records. If not, to f/u in 2wks for pap/physical and bp check off of meds- to stop 2d prior to appt. If  pap is up to date, will see in 2wks just for bp check off meds.   Marge DuncansBooker, Rayn Shorb Randall CNM, [facility]NP[facility] 11/20/2014 9:27 AM

## 2014-11-20 NOTE — Patient Instructions (Signed)
Stop Norvasc on 7/24, we will check your blood pressure at your visit  Keep the area clean and dry.  You can remove the big bandage in 24 hours, and the small steri-strip bandage in 3-5 days.  A back up method, such as condoms, should be used for two weeks. You may have irregular vaginal bleeding for the first 6 months after the Nexplanon is placed, then the bleeding usually lightens and it is possible that you may not have any periods.  If you have any concerns, please give [place] a call.    Etonogestrel implant What is this medicine? ETONOGESTREL (et oh noe JES trel) is a contraceptive (birth control) device. It is used to prevent pregnancy. It can be used for up to 3 years. This medicine may be used for other purposes; ask your health care provider or pharmacist if you have questions. COMMON BRAND NAME(S): Implanon, Nexplanon What should I tell my health care provider before I take this medicine? They need to know if you have any of these conditions: -abnormal vaginal bleeding -blood vessel disease or blood clots -cancer of the breast, cervix, or liver -depression -diabetes -gallbladder disease -headaches -heart disease or recent heart attack -high blood pressure -high cholesterol -kidney disease -liver disease -renal disease -seizures -tobacco smoker -an unusual or allergic reaction to etonogestrel, other hormones, anesthetics or antiseptics, medicines, foods, dyes, or preservatives -pregnant or trying to get pregnant -breast-feeding How should I use this medicine? This device is inserted just under the skin on the inner side of your upper arm by a health care professional. Talk to your pediatrician regarding the use of this medicine in children. Special care may be needed. Overdosage: If you think you've taken too much of this medicine contact a poison control center or emergency room at once. Overdosage: If you think you have taken too much of this medicine contact a poison control  center or emergency room at once. NOTE: This medicine is only for you. Do not share this medicine with others. What if I miss a dose? This does not apply. What may interact with this medicine? Do not take this medicine with any of the following medications: -amprenavir -bosentan -fosamprenavir This medicine may also interact with the following medications: -barbiturate medicines for inducing sleep or treating seizures -certain medicines for fungal infections like ketoconazole and itraconazole -griseofulvin -medicines to treat seizures like carbamazepine, felbamate, oxcarbazepine, phenytoin, topiramate -modafinil -phenylbutazone -rifampin -some medicines to treat HIV infection like atazanavir, indinavir, lopinavir, nelfinavir, tipranavir, ritonavir -St. John's wort This list may not describe all possible interactions. Give your health care provider a list of all the medicines, herbs, non-prescription drugs, or dietary supplements you use. Also tell them if you smoke, drink alcohol, or use illegal drugs. Some items may interact with your medicine. What should I watch for while using this medicine? This product does not protect you against HIV infection (AIDS) or other sexually transmitted diseases. You should be able to feel the implant by pressing your fingertips over the skin where it was inserted. Tell your doctor if you cannot feel the implant. What side effects may I notice from receiving this medicine? Side effects that you should report to your doctor or health care professional as soon as possible: -allergic reactions like skin rash, itching or hives, swelling of the face, lips, or tongue -breast lumps -changes in vision -confusion, trouble speaking or understanding -dark urine -depressed mood -general ill feeling or flu-like symptoms -light-colored stools -loss of appetite, nausea -right upper  belly pain -severe headaches -severe pain, swelling, or tenderness in the  abdomen -shortness of breath, chest pain, swelling in a leg -signs of pregnancy -sudden numbness or weakness of the face, arm or leg -trouble walking, dizziness, loss of balance or coordination -unusual vaginal bleeding, discharge -unusually weak or tired -yellowing of the eyes or skin Side effects that usually do not require medical attention (Report these to your doctor or health care professional if they continue or are bothersome.): -acne -breast pain -changes in weight -cough -fever or chills -headache -irregular menstrual bleeding -itching, burning, and vaginal discharge -pain or difficulty passing urine -sore throat This list may not describe all possible side effects. Call your doctor for medical advice about side effects. You may report side effects to FDA at 1-800-FDA-1088. Where should I keep my medicine? This drug is given in a hospital or clinic and will not be stored at home. NOTE: This sheet is a summary. It may not cover all possible information. If you have questions about this medicine, talk to your doctor, pharmacist, or health care provider.  2015, Elsevier/Gold Standard. (2011-11-02 15:37:45)

## 2014-11-26 ENCOUNTER — Encounter: Payer: Self-pay | Admitting: Women's Health

## 2014-11-26 DIAGNOSIS — Z124 Encounter for screening for malignant neoplasm of cervix: Secondary | ICD-10-CM | POA: Insufficient documentation

## 2014-12-04 ENCOUNTER — Ambulatory Visit (INDEPENDENT_AMBULATORY_CARE_PROVIDER_SITE_OTHER): Admitting: Women's Health

## 2014-12-04 ENCOUNTER — Encounter: Payer: Self-pay | Admitting: Women's Health

## 2014-12-04 VITALS — BP 104/68 | HR 76 | Wt 200.0 lb

## 2014-12-04 DIAGNOSIS — Z0130 Encounter for examination of blood pressure without abnormal findings: Secondary | ICD-10-CM | POA: Diagnosis not present

## 2014-12-04 NOTE — Progress Notes (Signed)
Patient ID: Dominique Garza, female   DOB: July 25, 1985, 29 y.o.   MRN: [id number]   [facility] Visit  Patient name: Dominique Garza MRN [id number]  Date of birth: 08/11/1985  CC & HPI:  Dominique Garza is a 29 y.o. African American female presenting today for bp check. Had GHTN, remained on norvasc  daily for continued pp HTN. Stopped Norvasc 2 days ago as directed. Has been exercising and eating healthier. Doing well, no complaints. Last pap 12/27/13 in [place]. Got Nexplanon 2 weeks ago, doing well.   Pertinent History Reviewed:  Medical & Surgical Hx:   Past Medical History  Diagnosis Date  . Fibromyalgia   . Pregnant 02/16/2014  . Anxiety   . HSV-2 seropositive   . Postpartum examination following vaginal delivery 10/15/2014  . Contraceptive management 10/15/2014  . Hypertension    Past Surgical History  Procedure Laterality Date  . Tonsillectomy     Medications: Reviewed & Updated - see associated section Social History: Reviewed -  reports that she has never smoked. She has never used smokeless tobacco.  Objective Findings:  Vitals: BP 104/68 mmHg  Pulse 76  Wt 200 lb (90.719 kg)  LMP 11/04/2014  Breastfeeding? No  Physical Examination: General appearance - alert, well appearing, and in no distress  No results found for this or any previous visit (from the past 24 hour(s)).   Assessment & Plan:  A:   BP follow-up  Resolved GHTN/pp HTN P:  Do not resume Norvasc  Continue exercising/eating healthy   F/U 25yr for physical, or sooner if needed   [name] CNM, [facility] 12/04/2014 9:28 AM

## 2014-12-19 ENCOUNTER — Emergency Department (HOSPITAL_COMMUNITY)
Admission: EM | Admit: 2014-12-19 | Discharge: 2014-12-19 | Disposition: A | Discharge status: Home / Self Care | Attending: Emergency Medicine | Admitting: Emergency Medicine

## 2014-12-19 ENCOUNTER — Encounter (HOSPITAL_COMMUNITY): Payer: Self-pay

## 2014-12-19 DIAGNOSIS — I10 Essential (primary) hypertension: Secondary | ICD-10-CM | POA: Diagnosis not present

## 2014-12-19 DIAGNOSIS — Z8739 Personal history of other diseases of the musculoskeletal system and connective tissue: Secondary | ICD-10-CM | POA: Diagnosis not present

## 2014-12-19 DIAGNOSIS — Z8659 Personal history of other mental and behavioral disorders: Secondary | ICD-10-CM | POA: Diagnosis not present

## 2014-12-19 DIAGNOSIS — L259 Unspecified contact dermatitis, unspecified cause: Secondary | ICD-10-CM | POA: Diagnosis not present

## 2014-12-19 DIAGNOSIS — R21 Rash and other nonspecific skin eruption: Secondary | ICD-10-CM | POA: Diagnosis present

## 2014-12-19 MED ORDER — PREDNISONE 10 MG PO TABS: ORAL_TABLET | ORAL | Status: DC

## 2014-12-19 MED ORDER — DIPHENHYDRAMINE HCL 25 MG PO CAPS
25.0000 mg | ORAL_CAPSULE | Freq: Once | ORAL | Status: AC
  Administered 2014-12-19: 25 mg via ORAL
  Filled 2014-12-19: qty 1

## 2014-12-19 MED ORDER — PREDNISONE 50 MG PO TABS
60.0000 mg | ORAL_TABLET | Freq: Once | ORAL | Status: AC
  Administered 2014-12-19: 60 mg via ORAL
  Filled 2014-12-19 (×2): qty 1

## 2014-12-19 NOTE — Discharge Instructions (Signed)
Follow up as discussed regarding your nexplanon. Start the prednisone tomorrow and continue to take benadryl  Contact Dermatitis Contact dermatitis is a rash that happens when something touches the skin. You touched something that irritates your skin, or you have allergies to something you touched. HOME CARE   Avoid the thing that caused your rash.  Keep your rash away from hot water, soap, sunlight, chemicals, and other things that might bother it.  Do not scratch your rash.  You can take cool baths to help stop itching.  Only take medicine as told by your doctor.  Keep all doctor visits as told. GET HELP RIGHT AWAY IF:   Your rash is not better after 3 days.  Your rash gets worse.  Your rash is puffy (swollen), tender, red, sore, or warm.  You have problems with your medicine. MAKE SURE YOU:   Understand these instructions.  Will watch your condition.  Will get help right away if you are not doing well or get worse. Document Released: 02/22/2009 Document Revised: 07/20/2011 Document Reviewed: 09/30/2010 [facility] Patient Information 2015 [place], [place]. This information is not intended to replace advice given to you by your health care provider. Make sure you discuss any questions you have with your health care provider.

## 2014-12-19 NOTE — ED Notes (Signed)
Pt reports woke up this morning with a few bumps on face but as the day goes on, started having red, itchy, burning rash all over.  Pt says recently started nexplanon.  Denies any difficulty breathing or swallowing.

## 2014-12-19 NOTE — ED Provider Notes (Signed)
CSN: [id number]     Arrival date & time 12/19/14  1451 History   First MD Initiated Contact with Patient 12/19/14 1524     Chief Complaint  Patient presents with  . Rash     (Consider location/radiation/quality/duration/timing/severity/associated sxs/prior Treatment) HPI Comments: Pt states that the only new exposure that she has is a nexplanon was put in 2 weeks ago.  Patient is a 29 y.o. female presenting with rash. The history is provided by the patient. No language interpreter was used.  Rash Location:  Full body Quality: itchiness   Severity:  Moderate Onset quality:  Gradual Duration:  1 day Timing:  Constant Progression:  Worsening Chronicity:  New Relieved by:  Nothing Worsened by:  Nothing tried Ineffective treatments:  None tried Associated symptoms: no abdominal pain, no fever, no hoarse voice, no joint pain, no shortness of breath, no throat swelling, no tongue swelling, no URI and not vomiting     Past Medical History  Diagnosis Date  . Fibromyalgia   . Pregnant 02/16/2014  . Anxiety   . HSV-2 seropositive   . Postpartum examination following vaginal delivery 10/15/2014  . Contraceptive management 10/15/2014  . Hypertension    Past Surgical History  Procedure Laterality Date  . Tonsillectomy     Family History  Problem Relation Age of Onset  . Fibromyalgia Mother   . Sarcoidosis Mother   . Diabetes Paternal Grandmother   . Hypertension Father    Social History  Substance Use Topics  . Smoking status: Never Smoker   . Smokeless tobacco: Never Used  . Alcohol Use: No   OB History    Gravida Para Term Preterm AB TAB SAB Ectopic Multiple Living   5 4 3 1 1  1   0 4     Review of Systems  Constitutional: Negative for fever.  HENT: Negative for hoarse voice.   Respiratory: Negative for shortness of breath.   Gastrointestinal: Negative for vomiting and abdominal pain.  Musculoskeletal: Negative for arthralgias.  Skin: Positive for rash.  All other  systems reviewed and are negative.     Allergies  Review of patient's allergies indicates no known allergies.  Home Medications   Prior to Admission medications   Medication Sig Start Date End Date Taking? Authorizing Provider  acyclovir (ZOVIRAX) 400 MG tablet Take 1 tablet (400 mg total) by mouth 3 (three) times daily. Patient not taking: Reported on 12/04/2014 08/02/14   Jacklyn Shell, CNM  amLODipine (NORVASC) 10 MG tablet Take 1 tablet (10 mg total) by mouth daily. Patient not taking: Reported on 12/04/2014 09/03/14   Montez Morita, CNM  ibuprofen (ADVIL,MOTRIN) 600 MG tablet Take 1 tablet (600 mg total) by mouth every 6 (six) hours. Patient not taking: Reported on 11/20/2014 09/03/14   Montez Morita, CNM  Prenatal Vit-Fe Fumarate-FA (PRENATAL VITAMIN PO) Take 1 tablet by mouth at bedtime.     Historical Provider, MD   BP 122/71 mmHg  Pulse 77  Temp(Src) 99 F (37.2 C) (Oral)  Resp 20  Ht 5\' 4"  (1.626 m)  Wt 199 lb (90.266 kg)  BMI 34.14 kg/m2  SpO2 100%  LMP 12/17/2014 Physical Exam  Constitutional: She is oriented to person, place, and time. She appears well-developed and well-nourished.  HENT:  Right Ear: External ear normal.  Left Ear: External ear normal.  Mouth/Throat: Oropharynx is clear and moist.  Cardiovascular: Normal rate and regular rhythm.   Pulmonary/Chest: Effort normal and breath sounds normal.  Musculoskeletal: Normal range  of motion.  Neurological: She is alert and oriented to person, place, and time.  Skin:  Red raised papules with and hives noted to trunk and upper extremities a couple of area noted on face and lower extremities  Nursing note and vitals reviewed.   ED Course  Procedures (including critical care time) Labs Review Labs Reviewed - No data to display  Imaging Review No results found.   EKG Interpretation None      MDM   Final diagnoses:  Contact dermatitis    Will treat with prednisone. Pt to continue  benadryl at home. No fevers. Discussed follow up with provider who placed the nexplanon. Pt given return precautions    Teressa Lower, NP 12/19/14 1556  Tilden Fossa, MD 12/19/14 1622

## 2014-12-20 ENCOUNTER — Telehealth: Payer: Self-pay | Admitting: *Deleted

## 2014-12-20 NOTE — Telephone Encounter (Signed)
Pt states she got the Nexplanon inserted 2 weeks ago has had a rash and blisters ever since. Went to ER and was given Benadryl and Prednisone and told to have Nexplanon removed. Pt states she wants nexplanon removed. Cyril Mourning, NP aware and appt given for December 27, 2014 @ 3:45 pm. Pt verbalized understanding.

## 2014-12-22 ENCOUNTER — Encounter (HOSPITAL_COMMUNITY): Payer: Self-pay | Admitting: *Deleted

## 2014-12-22 ENCOUNTER — Emergency Department (HOSPITAL_COMMUNITY)
Admission: EM | Admit: 2014-12-22 | Discharge: 2014-12-22 | Disposition: A | Discharge status: Home / Self Care | Payer: Medicaid Other | Attending: Emergency Medicine | Admitting: Emergency Medicine

## 2014-12-22 DIAGNOSIS — Z8659 Personal history of other mental and behavioral disorders: Secondary | ICD-10-CM | POA: Insufficient documentation

## 2014-12-22 DIAGNOSIS — I10 Essential (primary) hypertension: Secondary | ICD-10-CM | POA: Diagnosis not present

## 2014-12-22 DIAGNOSIS — L739 Follicular disorder, unspecified: Secondary | ICD-10-CM | POA: Insufficient documentation

## 2014-12-22 DIAGNOSIS — Z8739 Personal history of other diseases of the musculoskeletal system and connective tissue: Secondary | ICD-10-CM | POA: Insufficient documentation

## 2014-12-22 DIAGNOSIS — R21 Rash and other nonspecific skin eruption: Secondary | ICD-10-CM | POA: Diagnosis present

## 2014-12-22 MED ORDER — SULFAMETHOXAZOLE-TRIMETHOPRIM 800-160 MG PO TABS: 1.0000 | ORAL_TABLET | Freq: Two times a day (BID) | ORAL | Status: AC

## 2014-12-22 MED ORDER — HYDROXYZINE HCL 25 MG PO TABS: 25.0000 mg | ORAL_TABLET | Freq: Four times a day (QID) | ORAL | Status: DC

## 2014-12-22 NOTE — Discharge Instructions (Signed)
Rash A rash is a change in the color or feel of your skin. There are many different types of rashes. You may have other problems along with your rash. HOME CARE  Avoid the thing that caused your rash.  Do not scratch your rash.  You may take cools baths to help stop itching.  Only take medicines as told by your doctor.  Keep all doctor visits as told. GET HELP RIGHT AWAY IF:   Your pain, puffiness (swelling), or redness gets worse.  You have a fever.  You have new or severe problems.  You have body aches, watery poop (diarrhea), or you throw up (vomit).  Your rash is not better after 3 days. MAKE SURE YOU:   Understand these instructions.  Will watch your condition.  Will get help right away if you are not doing well or get worse. Document Released: 10/14/2007 Document Revised: 07/20/2011 Document Reviewed: 02/09/2011 [employer] Patient Information 2015 [employer]. This information is not intended to replace advice given to you by your health care provider. Make sure you discuss any questions you have with your health care provider.  

## 2014-12-22 NOTE — ED Notes (Signed)
Nexplanon placed 11/20/14. States as soon as it was placed receeding hair line, lumps in the head. Here for reaction here 2 days ago also due to Nexplanon. Pt has appt on the 18th to have it removed.

## 2014-12-24 NOTE — ED Provider Notes (Signed)
CSN: [id number]     Arrival date & time 12/22/14  1551 History   First MD Initiated Contact with Patient 12/22/14 1604     Chief Complaint  Patient presents with  . Hypersensitivity Reaction     (Consider location/radiation/quality/duration/timing/severity/associated sxs/prior Treatment) HPI  Dominique Garza is a 29 y.o. female who presents to the Emergency Department complaining of painful "bumps" on her scalp.   For 2 days.  She reports having a recent placement of nexplanon implant and reports having rashes and painful "bumps" , itching since it was placed.  She was seen here three days ago for full body rash with has improved since starting prednisone.  She denies fever, chills, swelling or difficulty breathing.  Also requests to have implant removed, but has appt for same on 12/27/14.     Past Medical History  Diagnosis Date  . Fibromyalgia   . Pregnant 02/16/2014  . Anxiety   . HSV-2 seropositive   . Postpartum examination following vaginal delivery 10/15/2014  . Contraceptive management 10/15/2014  . Hypertension    Past Surgical History  Procedure Laterality Date  . Tonsillectomy     Family History  Problem Relation Age of Onset  . Fibromyalgia Mother   . Sarcoidosis Mother   . Diabetes Paternal Grandmother   . Hypertension Father    Social History  Substance Use Topics  . Smoking status: Never Smoker   . Smokeless tobacco: Never Used  . Alcohol Use: No   OB History    Gravida Para Term Preterm AB TAB SAB Ectopic Multiple Living   5 4 3 1 1  1   0 4     Review of Systems  Constitutional: Negative for fever and chills.  Gastrointestinal: Negative for nausea and vomiting.  Musculoskeletal: Negative for joint swelling and arthralgias.  Skin:       Painful "bumps" to scalp  Hematological: Negative for adenopathy.  All other systems reviewed and are negative.     Allergies  Review of patient's allergies indicates no known allergies.  Home Medications   Prior  to Admission medications   Medication Sig Start Date End Date Taking? Authorizing Provider  etonogestrel (NEXPLANON) 68 MG IMPL implant 1 each by Subdermal route once.   Yes Historical Provider, MD  predniSONE (DELTASONE) 10 MG tablet 6 day step down dose 12/19/14  Yes Teressa Lower, NP  Prenatal Vit-Fe Fumarate-FA (PRENATAL VITAMIN PO) Take 1 tablet by mouth at bedtime.    Yes Historical Provider, MD  acyclovir (ZOVIRAX) 400 MG tablet Take 1 tablet (400 mg total) by mouth 3 (three) times daily. Patient not taking: Reported on 12/04/2014 08/02/14   Jacklyn Shell, CNM  amLODipine (NORVASC) 10 MG tablet Take 1 tablet (10 mg total) by mouth daily. Patient not taking: Reported on 12/04/2014 09/03/14   Montez Morita, CNM  hydrOXYzine (ATARAX/VISTARIL) 25 MG tablet Take 1 tablet (25 mg total) by mouth every 6 (six) hours. 12/22/14   Fremont Skalicky, PA-C  ibuprofen (ADVIL,MOTRIN) 600 MG tablet Take 1 tablet (600 mg total) by mouth every 6 (six) hours. Patient not taking: Reported on 11/20/2014 09/03/14   Montez Morita, CNM  sulfamethoxazole-trimethoprim (BACTRIM DS,SEPTRA DS) 800-160 MG per tablet Take 1 tablet by mouth 2 (two) times daily. For 10 days 12/22/14 12/29/14  Lorma Heater, PA-C   BP 117/84 mmHg  Pulse 89  Temp(Src) 98.1 F (36.7 C) (Oral)  Resp 16  Ht 5\' 4"  (1.626 m)  Wt 199 lb (90.266 kg)  BMI  34.14 kg/m2  SpO2 99%  LMP 12/17/2014 Physical Exam  Constitutional: She is oriented to person, place, and time. She appears well-developed and well-nourished. No distress.  HENT:  Head: Normocephalic and atraumatic.  Mouth/Throat: Oropharynx is clear and moist.  Neck: Normal range of motion. Neck supple.  Cardiovascular: Normal rate, regular rhythm, normal heart sounds and intact distal pulses.   No murmur heard. Pulmonary/Chest: Effort normal and breath sounds normal. No respiratory distress.  Musculoskeletal: She exhibits no edema or tenderness.  Lymphadenopathy:    She has  no cervical adenopathy.  Neurological: She is alert and oriented to person, place, and time. She exhibits normal muscle tone. Coordination normal.  Skin: Skin is warm. There is erythema.  Erythematous pustules along the hairline.  No edema or abscess  Nursing note and vitals reviewed.   ED Course  Procedures (including critical care time) Labs Review Labs Reviewed - No data to display  Imaging Review   EKG Interpretation None      MDM   Final diagnoses:  Folliculitis   Pt seen here 3 days ago for rash which she contributes to recent contraceptive implant.   Pt is well appearing. Vital stable.  Several small pustules to the scalp.  No drainage, no alopecia.  Pt is requesting implant removal, but has appt for same on 8/18.  Advised to keep appt due to no emergent indication for removal at this time.  She agrees to plan.  Appears stable for d/c    Pauline Aus, PA-C 12/24/14 1716  Doug Sou, MD 12/31/14 [phone]

## 2014-12-27 ENCOUNTER — Encounter: Payer: Self-pay | Admitting: Adult Health

## 2014-12-27 ENCOUNTER — Ambulatory Visit (INDEPENDENT_AMBULATORY_CARE_PROVIDER_SITE_OTHER): Admitting: Adult Health

## 2014-12-27 VITALS — BP 124/74 | HR 76 | Ht 64.0 in | Wt 200.0 lb

## 2014-12-27 DIAGNOSIS — L709 Acne, unspecified: Secondary | ICD-10-CM | POA: Diagnosis not present

## 2014-12-27 DIAGNOSIS — Z975 Presence of (intrauterine) contraceptive device: Secondary | ICD-10-CM

## 2014-12-27 DIAGNOSIS — L509 Urticaria, unspecified: Secondary | ICD-10-CM | POA: Insufficient documentation

## 2014-12-27 HISTORY — DX: Acne, unspecified: L70.9

## 2014-12-27 HISTORY — DX: Urticaria, unspecified: L50.9

## 2014-12-27 NOTE — Patient Instructions (Signed)
Hives Hives are itchy, red, swollen areas of the skin. They can vary in size and location on your body. Hives can come and go for hours or several days (acute hives) or for several weeks (chronic hives). Hives do not spread from person to person (noncontagious). They may get worse with scratching, exercise, and emotional stress. CAUSES   Allergic reaction to food, additives, or drugs.  Infections, including the common cold.  Illness, such as vasculitis, lupus, or thyroid disease.  Exposure to sunlight, heat, or cold.  Exercise.  Stress.  Contact with chemicals. SYMPTOMS   Red or white swollen patches on the skin. The patches may change size, shape, and location quickly and repeatedly.  Itching.  Swelling of the hands, feet, and face. This may occur if hives develop deeper in the skin. DIAGNOSIS  Your caregiver can usually tell what is wrong by performing a physical exam. Skin or blood tests may also be done to determine the cause of your hives. In some cases, the cause cannot be determined. TREATMENT  Mild cases usually get better with medicines such as antihistamines. Severe cases may require an emergency epinephrine injection. If the cause of your hives is known, treatment includes avoiding that trigger.  HOME CARE INSTRUCTIONS   Avoid causes that trigger your hives.  Take antihistamines as directed by your caregiver to reduce the severity of your hives. Non-sedating or low-sedating antihistamines are usually recommended. Do not drive while taking an antihistamine.  Take any other medicines prescribed for itching as directed by your caregiver.  Wear loose-fitting clothing.  Keep all follow-up appointments as directed by your caregiver. SEEK MEDICAL CARE IF:   You have persistent or severe itching that is not relieved with medicine.  You have painful or swollen joints. SEEK IMMEDIATE MEDICAL CARE IF:   You have a fever.  Your tongue or lips are swollen.  You have  trouble breathing or swallowing.  You feel tightness in the throat or chest.  You have abdominal pain. These problems may be the first sign of a life-threatening allergic reaction. Call your local emergency services (911 in [place] MAKE SURE YOU:   Understand these instructions.  Will watch your condition.  Will get help right away if you are not doing well or get worse. Document Released: 04/27/2005 Document Revised: 05/02/2013 Document Reviewed: 07/21/2011 [facility] Patient Information 2015 [place], [place]. This information is not intended to replace advice given to you by your health care provider. Make sure you discuss any questions you have with your health care provider. Will keep nexplanon for now and recheck in 1 week when off prednisone

## 2014-12-27 NOTE — Progress Notes (Signed)
Subjective:     Patient ID: Dominique Garza, female   DOB: 1986-03-14, 29 y.o.   MRN: [id number]  HPI Dominique Garza is a 29 year old black female in complaining of hives and rash and bumps in head, was seen in ER 8/10 and placed on prednisone and then again 8/13.She had no new soaps or detergents, but had nexplanon inserted in July.   Review of Systems Patient denies any headaches, hearing loss, fatigue, blurred vision, shortness of breath, chest pain, abdominal pain, problems with bowel movements, urination, or intercourse. No joint pain or mood swings.See HPI for positives. Reviewed past medical,surgical, social and family history. Reviewed medications and allergies.     Objective:   Physical Exam BP 124/74 mmHg  Pulse 76  Ht  (1.626 m)  Wt 200 lb (90.719 kg)  BMI 34.31 kg/m2  LMP 12/17/2014  Breastfeeding? NoHas cystic like acne in hair line, no hives or scaling noted,discussed can take nexplanon out or wait to see how does when prednisone finished, discussed with Dr Despina Hidden and he agrees.She agrees to this plan.    Assessment:     Acne Hives Nexplanon in place     Plan:     Will wait til prednisone out of system, and follow up in 1 week, if any hives or acne will remove nexplanon then, and she agrees, she says she needs the nexplanon

## 2015-01-03 ENCOUNTER — Ambulatory Visit: Admitting: Adult Health

## 2015-07-10 ENCOUNTER — Encounter: Payer: Self-pay | Admitting: Obstetrics and Gynecology

## 2015-07-10 ENCOUNTER — Encounter: Admitting: Obstetrics and Gynecology

## 2015-07-15 ENCOUNTER — Encounter: Admitting: Obstetrics and Gynecology

## 2015-11-06 ENCOUNTER — Ambulatory Visit: Payer: Medicaid Other | Admitting: Obstetrics and Gynecology

## 2015-11-06 ENCOUNTER — Encounter: Payer: Self-pay | Admitting: Obstetrics and Gynecology

## 2015-11-06 VITALS — BP 120/80 | Ht 64.0 in | Wt 227.5 lb

## 2015-11-06 DIAGNOSIS — Z3046 Encounter for surveillance of implantable subdermal contraceptive: Secondary | ICD-10-CM

## 2015-11-06 NOTE — Progress Notes (Signed)
[facility] Visit  @DATE @            Patient name: Dominique Garza MRN [id number]  Date of birth: 04/08/1986  CC & HPI:  Dominique Garza is a 30 y.o. female presenting today for intermittent, generalized "red, itchy hives all over by body" occurring once weekly, since she had Nexplanon placed 1 year ago. She notes she has also had intermittent "lumps" surrounding the area on her arm where the Nexplanon was placed, which has only occurred since placement of the Nexplanon. However, patient states she is asymptomatic today. She states she has been evaluated in the ED for her rash in the past year. Per medical records, patient was seen in the ED on 12/19/14 for the rash, and she was prescribed prednisone, which improved her rash. Patient states her weight has increased from 180 to 227 lbs in the past year. She reports no menstrual periods on the Nexplanon but also notes, "I never had periods." She denies any acne breakouts.   ROS:  Review of Systems  Skin: Positive for rash.  All other systems reviewed and are negative.    Pertinent History Reviewed:   Reviewed: Significant for n/a Medical         Past Medical History  Diagnosis Date  . Fibromyalgia   . Pregnant 02/16/2014  . Anxiety   . HSV-2 seropositive   . Postpartum examination following vaginal delivery 10/15/2014  . Contraceptive management 10/15/2014  . Hypertension   . Acne 12/27/2014  . Hives of unknown origin 12/27/2014                              Surgical Hx:    Past Surgical History  Procedure Laterality Date  . Tonsillectomy     Medications: Reviewed & Updated - see associated section                       Current outpatient prescriptions:  .  etonogestrel (NEXPLANON) 68 MG IMPL implant, 1 each by Subdermal route once., Disp: , Rfl:    Social History: Reviewed -  reports that she has never smoked. She has never used smokeless tobacco.  Objective Findings:  Vitals: Blood pressure 120/80, height 5\' 4"  (1.626 m),  weight 227 lb 8 oz (103.193 kg), not currently breastfeeding.  Physical Examination: General appearance - alert, well appearing, and in no distress, oriented to person, place, and time and overweight Mental status - alert, oriented to person, place, and time, normal mood, behavior, speech, dress, motor activity, and thought processes, affect appropriate to mood   [facility] PROCEDURE NOTE - Nexplanon Removal   Patient given informed consent, signed copy in the chart, time out was performed.   Appropriate time out taken.  Patient and provider were able to palpate rod under skin.  Patient's left arm was prepped and draped in the usual sterile fashion.  Pt was prepped with alcohol swab and then injected with 3 cc of 1 % lidocaine.   Pt was prepped with betadine, incision made, and Nexplanon removed from arm. Pt removal site covered with sterile dressing.   Minimal blood loss.  Pt tolerated the procedure well.    Assessment & Plan:   A:  1. Pt desires Nexplanon removal. Nexplanon removed by myself without difficulty (see procedure note above).    P:  1. Follow up prn.   By signing my  name below, I, Ronney LionSuzanne Le, attest that this documentation has been prepared under the direction and in the presence of Tilda BurrowJohn V Armanie Ullmer, MD. Electronically Signed: Ronney LionSuzanne Le, ED Scribe. 11/06/2015. 2:46 PM.  I personally performed the services described in this documentation, which was SCRIBED in my presence. The recorded information has been reviewed and considered accurate. It has been edited as necessary during review. Tilda BurrowFERGUSON,Zohair Epp V, MD

## 2015-11-20 ENCOUNTER — Emergency Department (HOSPITAL_COMMUNITY)
Admission: EM | Admit: 2015-11-20 | Discharge: 2015-11-20 | Disposition: A | Discharge status: Home / Self Care | Payer: Medicaid Other

## 2015-11-20 NOTE — ED Notes (Signed)
No answer x 3  No answer

## 2015-11-20 NOTE — ED Notes (Signed)
Called name for triage, no response 

## 2015-11-20 NOTE — ED Notes (Signed)
Called name for triage, no response

## 2015-11-21 ENCOUNTER — Encounter (HOSPITAL_COMMUNITY): Payer: Self-pay | Admitting: Emergency Medicine

## 2015-11-21 ENCOUNTER — Emergency Department (HOSPITAL_COMMUNITY)
Admission: EM | Admit: 2015-11-21 | Discharge: 2015-11-21 | Disposition: A | Discharge status: Home / Self Care | Payer: Medicaid Other | Attending: Emergency Medicine | Admitting: Emergency Medicine

## 2015-11-21 ENCOUNTER — Emergency Department (HOSPITAL_COMMUNITY): Payer: Medicaid Other

## 2015-11-21 DIAGNOSIS — M549 Dorsalgia, unspecified: Secondary | ICD-10-CM | POA: Insufficient documentation

## 2015-11-21 DIAGNOSIS — I10 Essential (primary) hypertension: Secondary | ICD-10-CM | POA: Diagnosis not present

## 2015-11-21 DIAGNOSIS — Z791 Long term (current) use of non-steroidal anti-inflammatories (NSAID): Secondary | ICD-10-CM | POA: Insufficient documentation

## 2015-11-21 DIAGNOSIS — Z79899 Other long term (current) drug therapy: Secondary | ICD-10-CM | POA: Diagnosis not present

## 2015-11-21 DIAGNOSIS — R110 Nausea: Secondary | ICD-10-CM | POA: Diagnosis not present

## 2015-11-21 DIAGNOSIS — R1013 Epigastric pain: Secondary | ICD-10-CM | POA: Diagnosis not present

## 2015-11-21 DIAGNOSIS — Z7982 Long term (current) use of aspirin: Secondary | ICD-10-CM | POA: Insufficient documentation

## 2015-11-21 LAB — COMPREHENSIVE METABOLIC PANEL
ALBUMIN: 3.8 g/dL (ref 3.5–5.0)
ALT: 23 U/L (ref 14–54)
AST: 21 U/L (ref 15–41)
Alkaline Phosphatase: 73 U/L (ref 38–126)
Anion gap: 2 — ABNORMAL LOW (ref 5–15)
BILIRUBIN TOTAL: 0.4 mg/dL (ref 0.3–1.2)
BUN: 10 mg/dL (ref 6–20)
CO2: 25 mmol/L (ref 22–32)
Calcium: 8.6 mg/dL — ABNORMAL LOW (ref 8.9–10.3)
Chloride: 110 mmol/L (ref 101–111)
Creatinine, Ser: 0.74 mg/dL (ref 0.44–1.00)
GFR calc Af Amer: >60 mL/min (ref >60)
GFR calc non Af Amer: >60 mL/min (ref >60)
Glucose, Bld: 99 mg/dL (ref 65–99)
Potassium: 3.3 mmol/L — ABNORMAL LOW (ref 3.5–5.1)
SODIUM: 137 mmol/L (ref 135–145)
TOTAL PROTEIN: 7.2 g/dL (ref 6.5–8.1)

## 2015-11-21 LAB — CBC WITH DIFFERENTIAL/PLATELET
BASOS ABS: 0 10*3/uL (ref 0.0–0.1)
BASOS PCT: 0 %
Eosinophils Absolute: 0.1 10*3/uL (ref 0.0–0.7)
Eosinophils Relative: 1 %
HEMATOCRIT: 36.1 % (ref 36.0–46.0)
HEMOGLOBIN: 12.1 g/dL (ref 12.0–15.0)
LYMPHS PCT: 24 %
Lymphs Abs: 1.2 10*3/uL (ref 0.7–4.0)
MCH: 29.2 pg (ref 26.0–34.0)
MCHC: 33.5 g/dL (ref 30.0–36.0)
MCV: 87 fL (ref 78.0–100.0)
MONO ABS: 0.4 10*3/uL (ref 0.1–1.0)
Monocytes Relative: 7 %
NEUTROS ABS: 3.5 10*3/uL (ref 1.7–7.7)
NEUTROS PCT: 68 %
Platelets: 169 10*3/uL (ref 150–400)
RBC: 4.15 MIL/uL (ref 3.87–5.11)
RDW: 13.2 % (ref 11.5–15.5)
WBC: 5.1 10*3/uL (ref 4.0–10.5)

## 2015-11-21 LAB — URINALYSIS, ROUTINE W REFLEX MICROSCOPIC
Bilirubin Urine: NEGATIVE
GLUCOSE, UA: NEGATIVE mg/dL
Hgb urine dipstick: NEGATIVE
Ketones, ur: NEGATIVE mg/dL
Leukocytes, UA: NEGATIVE
Nitrite: NEGATIVE
PROTEIN: NEGATIVE mg/dL
Specific Gravity, Urine: 1.02 (ref 1.005–1.030)
pH: 6 (ref 5.0–8.0)

## 2015-11-21 LAB — LIPASE, BLOOD: Lipase: 20 U/L (ref 11–51)

## 2015-11-21 LAB — PREGNANCY, URINE: Preg Test, Ur: NEGATIVE

## 2015-11-21 MED ORDER — PANTOPRAZOLE SODIUM 40 MG IV SOLR
40.0000 mg | Freq: Once | INTRAVENOUS | Status: AC
  Administered 2015-11-21: 40 mg via INTRAVENOUS
  Filled 2015-11-21: qty 40

## 2015-11-21 MED ORDER — HYDROMORPHONE HCL 1 MG/ML IJ SOLN
1.0000 mg | Freq: Once | INTRAMUSCULAR | Status: AC
  Administered 2015-11-21: 1 mg via INTRAVENOUS
  Filled 2015-11-21: qty 1

## 2015-11-21 MED ORDER — ONDANSETRON HCL 4 MG/2ML IJ SOLN
4.0000 mg | Freq: Once | INTRAMUSCULAR | Status: AC
  Administered 2015-11-21: 4 mg via INTRAVENOUS
  Filled 2015-11-21: qty 2

## 2015-11-21 MED ORDER — PROMETHAZINE HCL 25 MG PO TABS: 25.0000 mg | ORAL_TABLET | Freq: Four times a day (QID) | ORAL | Status: DC | PRN

## 2015-11-21 MED ORDER — HYDROCODONE-ACETAMINOPHEN 5-325 MG PO TABS: 1.0000 | ORAL_TABLET | Freq: Four times a day (QID) | ORAL | Status: DC | PRN

## 2015-11-21 MED ORDER — DIATRIZOATE MEGLUMINE & SODIUM 66-10 % PO SOLN
ORAL | Status: AC
  Filled 2015-11-21: qty 30

## 2015-11-21 MED ORDER — IOPAMIDOL (ISOVUE-300) INJECTION 61%
100.0000 mL | Freq: Once | INTRAVENOUS | Status: AC | PRN
  Administered 2015-11-21: 100 mL via INTRAVENOUS

## 2015-11-21 MED ORDER — SODIUM CHLORIDE 0.9 % IV BOLUS (SEPSIS)
1000.0000 mL | Freq: Once | INTRAVENOUS | Status: AC
  Administered 2015-11-21: 1000 mL via INTRAVENOUS

## 2015-11-21 MED ORDER — SODIUM CHLORIDE 0.9 % IV SOLN
INTRAVENOUS | Status: DC
  Administered 2015-11-21: 21:00:00 via INTRAVENOUS

## 2015-11-21 MED ORDER — FAMOTIDINE 20 MG PO TABS: 20.0000 mg | ORAL_TABLET | Freq: Two times a day (BID) | ORAL | Status: DC

## 2015-11-21 NOTE — ED Notes (Signed)
Pt requesting pain med to last through night since pharmacy is closed. Per Dr Deretha EmoryZackowski may give repeat dose of dilaudid

## 2015-11-21 NOTE — Discharge Instructions (Signed)
Workup here today including CAT scan without any significant abnormalities. Symptoms could be related to a stomach ulcer. Take the Pepcid as directed. Take the hydrocodone only as needed. Take Phenergan as needed for nausea. Return for any new or worse symptoms.

## 2015-11-21 NOTE — ED Provider Notes (Signed)
CSN: [id number]     Arrival date & time 11/21/15  1825 History   First MD Initiated Contact with Patient 11/21/15 1828     Chief Complaint  Patient presents with  . Abdominal Pain     (Consider location/radiation/quality/duration/timing/severity/associated sxs/prior Treatment) Patient is a 30 y.o. female presenting with abdominal pain. The history is provided by the patient.  Abdominal Pain Associated symptoms: nausea   Associated symptoms: no chest pain, no diarrhea, no dysuria, no fever, no shortness of breath and no vomiting   Patient with onset of epigastric abdominal pain yesterday morning. Pain is sharp in nature 8 out of 10 radiates to the back. No history of prior pain like this. Not associated with vomiting or diarrhea but associated with nausea.  Past Medical History  Diagnosis Date  . Fibromyalgia   . Pregnant 02/16/2014  . Anxiety   . HSV-2 seropositive   . Postpartum examination following vaginal delivery 10/15/2014  . Contraceptive management 10/15/2014  . Hypertension   . Acne 12/27/2014  . Hives of unknown origin 12/27/2014   Past Surgical History  Procedure Laterality Date  . Tonsillectomy     Family History  Problem Relation Age of Onset  . Fibromyalgia Mother   . Sarcoidosis Mother   . Diabetes Paternal Grandmother   . Hypertension Father    Social History  Substance Use Topics  . Smoking status: Never Smoker   . Smokeless tobacco: Never Used  . Alcohol Use: No   OB History    Gravida Para Term Preterm AB TAB SAB Ectopic Multiple Living   0 4     Review of Systems  Constitutional: Negative for fever.  HENT: Negative for congestion.   Eyes: Negative for redness.  Respiratory: Negative for shortness of breath.   Cardiovascular: Negative for chest pain.  Gastrointestinal: Positive for nausea and abdominal pain. Negative for vomiting and diarrhea.  Genitourinary: Negative for dysuria.  Musculoskeletal: Positive for back pain.  Skin:  Negative for rash.  Neurological: Negative for headaches.  Hematological: Does not bruise/bleed easily.  Psychiatric/Behavioral: Negative for confusion.      Allergies  Review of patient's allergies indicates no known allergies.  Home Medications   Prior to Admission medications   Medication Sig Start Date End Date Taking? Authorizing Provider  Aspirin-Salicylamide-Caffeine (BC HEADACHE POWDER PO) Take 1 packet by mouth daily as needed (pain).   Yes Historical Provider, MD  Bismuth Subsalicylate (PEPTO-BISMOL PO) Take 30 mLs by mouth daily as needed (pain).   Yes Historical Provider, MD  ibuprofen (ADVIL,MOTRIN) 200 MG tablet Take 400 mg by mouth every 6 (six) hours as needed for moderate pain.   Yes Historical Provider, MD  famotidine (PEPCID) 20 MG tablet Take 1 tablet (20 mg total) by mouth 2 (two) times daily. 11/21/15   Vanetta Mulders, MD  HYDROcodone-acetaminophen (NORCO/VICODIN) 5-325 MG tablet Take 1-2 tablets by mouth every 6 (six) hours as needed. 11/21/15   Vanetta Mulders, MD  promethazine (PHENERGAN) 25 MG tablet Take 1 tablet (25 mg total) by mouth every 6 (six) hours as needed. 11/21/15   Vanetta Mulders, MD   BP 114/80 mmHg  Pulse 72  Temp(Src) 98.3 F (36.8 C) (Oral)  Resp 20  Ht  (1.626 m)  Wt 102.967 kg  BMI 38.95 kg/m2  SpO2 100%  LMP  Physical Exam  Constitutional: She is oriented to person, place, and time. She appears well-developed and well-nourished. No distress.  HENT:  Head: Normocephalic and atraumatic.  Mouth/Throat: Oropharynx is clear and moist.  Eyes: Conjunctivae and EOM are normal. Pupils are equal, round, and reactive to light.  Neck: Normal range of motion. Neck supple.  Cardiovascular: Normal rate, regular rhythm and normal heart sounds.   No murmur heard. Pulmonary/Chest: Effort normal and breath sounds normal. No respiratory distress.  Abdominal: Soft. Bowel sounds are normal. There is tenderness.  I'll tenderness epigastric area no  guarding  Musculoskeletal: Normal range of motion.  Neurological: She is alert and oriented to person, place, and time. No cranial nerve deficit. She exhibits normal muscle tone. Coordination normal.  Skin: Skin is warm. No rash noted.  Nursing note and vitals reviewed.   ED Course  Procedures (including critical care time) Labs Review Labs Reviewed  COMPREHENSIVE METABOLIC PANEL - Abnormal; Notable for the following:    Potassium 3.3 (*)    Calcium 8.6 (*)    Anion gap 2 (*)    All other components within normal limits  LIPASE, BLOOD  URINALYSIS, ROUTINE W REFLEX MICROSCOPIC (NOT AT [facility])  CBC WITH DIFFERENTIAL/PLATELET  PREGNANCY, URINE    Imaging Review Ct Abdomen Pelvis W Contrast  11/21/2015  CLINICAL DATA:  Mid to upper abdominal pain for 1 day with nausea. EXAM: CT ABDOMEN AND PELVIS WITH CONTRAST TECHNIQUE: Multidetector CT imaging of the abdomen and pelvis was performed using the standard protocol following bolus administration of intravenous contrast. CONTRAST:  100mL ISOVUE-300 IOPAMIDOL (ISOVUE-300) INJECTION 61% COMPARISON:  12/08/2006 FINDINGS: Lung bases:  Clear.  Heart normal in size. Liver, spleen, gallbladder, pancreas, adrenal glands:  Normal. Kidneys, ureters, bladder:  Normal. Uterus and adnexa:  Normal. Lymph nodes: No pathologically enlarged lymph nodes. There are multiple prominent mesenteric lymph nodes, largest measuring 7 mm in short axis. Ascites:  None. Gastrointestinal:  Normal.  Normal appendix visualized. Musculoskeletal:  Normal. IMPRESSION: 1. Normal enhanced CT scan of the abdomen pelvis. Electronically Signed   By: Amie Portlandavid  Ormond M.D.   On: 11/21/2015 20:04   I have personally reviewed and evaluated these images and lab results as part of my medical decision-making.   EKG Interpretation None      MDM   Final diagnoses:  Epigastric abdominal pain    Patient workup for epigastric abdominal pain normal lab CT scan of the abdomen without any acute  findings. Patient will be treated as if it could be peptic ulcer disease. Patient has follow-up with health department.    Vanetta MuldersScott Nakiya Rallis, MD 11/21/15 2125

## 2015-11-21 NOTE — ED Notes (Signed)
Pt reports upper mid abdominal pain that started yesterday. Pt reports nausea but no diarrhea or emesis.

## 2015-11-28 ENCOUNTER — Telehealth: Payer: Self-pay | Admitting: Obstetrics and Gynecology

## 2015-11-29 ENCOUNTER — Other Ambulatory Visit: Payer: Self-pay | Admitting: Obstetrics and Gynecology

## 2015-11-29 MED ORDER — NORGESTIMATE-ETH ESTRADIOL 0.25-35 MG-MCG PO TABS: 1.0000 | ORAL_TABLET | Freq: Every day | ORAL | Status: DC

## 2015-11-29 NOTE — Telephone Encounter (Signed)
rx for sprintec will be E-scribed to pharmacy today

## 2015-12-01 ENCOUNTER — Encounter (HOSPITAL_COMMUNITY): Payer: Self-pay | Admitting: Emergency Medicine

## 2015-12-01 ENCOUNTER — Emergency Department (HOSPITAL_COMMUNITY): Payer: Medicaid Other

## 2015-12-01 ENCOUNTER — Emergency Department (HOSPITAL_COMMUNITY)
Admission: EM | Admit: 2015-12-01 | Discharge: 2015-12-01 | Disposition: A | Discharge status: Home / Self Care | Payer: Medicaid Other | Attending: Emergency Medicine | Admitting: Emergency Medicine

## 2015-12-01 DIAGNOSIS — Z79899 Other long term (current) drug therapy: Secondary | ICD-10-CM | POA: Insufficient documentation

## 2015-12-01 DIAGNOSIS — I10 Essential (primary) hypertension: Secondary | ICD-10-CM | POA: Insufficient documentation

## 2015-12-01 DIAGNOSIS — J189 Pneumonia, unspecified organism: Secondary | ICD-10-CM | POA: Insufficient documentation

## 2015-12-01 DIAGNOSIS — R05 Cough: Secondary | ICD-10-CM | POA: Diagnosis present

## 2015-12-01 LAB — URINALYSIS, ROUTINE W REFLEX MICROSCOPIC
Bilirubin Urine: NEGATIVE
GLUCOSE, UA: NEGATIVE mg/dL
HGB URINE DIPSTICK: NEGATIVE
Ketones, ur: NEGATIVE mg/dL
LEUKOCYTES UA: NEGATIVE
Nitrite: NEGATIVE
Protein, ur: NEGATIVE mg/dL
pH: 6 (ref 5.0–8.0)

## 2015-12-01 LAB — CBC WITH DIFFERENTIAL/PLATELET
BASOS ABS: 0 10*3/uL (ref 0.0–0.1)
BASOS PCT: 0 %
EOS ABS: 0 10*3/uL (ref 0.0–0.7)
EOS PCT: 0 %
HCT: 37.7 % (ref 36.0–46.0)
Hemoglobin: 12.7 g/dL (ref 12.0–15.0)
Lymphocytes Relative: 9 %
Lymphs Abs: 1.3 10*3/uL (ref 0.7–4.0)
MCH: 28.9 pg (ref 26.0–34.0)
MCHC: 33.7 g/dL (ref 30.0–36.0)
MCV: 85.9 fL (ref 78.0–100.0)
MONO ABS: 0.6 10*3/uL (ref 0.1–1.0)
Monocytes Relative: 5 %
NEUTROS ABS: 12.5 10*3/uL — AB (ref 1.7–7.7)
Neutrophils Relative %: 86 %
PLATELETS: 152 10*3/uL (ref 150–400)
RBC: 4.39 MIL/uL (ref 3.87–5.11)
RDW: 12.8 % (ref 11.5–15.5)
WBC: 14.4 10*3/uL — ABNORMAL HIGH (ref 4.0–10.5)

## 2015-12-01 LAB — COMPREHENSIVE METABOLIC PANEL
ALBUMIN: 4.2 g/dL (ref 3.5–5.0)
ALT: 77 U/L — ABNORMAL HIGH (ref 14–54)
AST: 77 U/L — AB (ref 15–41)
Alkaline Phosphatase: 111 U/L (ref 38–126)
Anion gap: 9 (ref 5–15)
BUN: 11 mg/dL (ref 6–20)
CHLORIDE: 98 mmol/L — AB (ref 101–111)
CO2: 22 mmol/L (ref 22–32)
Calcium: 8.9 mg/dL (ref 8.9–10.3)
Creatinine, Ser: 0.98 mg/dL (ref 0.44–1.00)
GFR calc Af Amer: >60 mL/min (ref >60)
GFR calc non Af Amer: >60 mL/min (ref >60)
GLUCOSE: 101 mg/dL — AB (ref 65–99)
POTASSIUM: 3.6 mmol/L (ref 3.5–5.1)
SODIUM: 129 mmol/L — AB (ref 135–145)
Total Bilirubin: 0.7 mg/dL (ref 0.3–1.2)
Total Protein: 8.1 g/dL (ref 6.5–8.1)

## 2015-12-01 LAB — I-STAT CG4 LACTIC ACID, ED: LACTIC ACID, VENOUS: 1.23 mmol/L (ref 0.5–1.9)

## 2015-12-01 MED ORDER — BENZONATATE 100 MG PO CAPS
200.0000 mg | ORAL_CAPSULE | Freq: Once | ORAL | Status: AC
  Administered 2015-12-01: 200 mg via ORAL
  Filled 2015-12-01: qty 2

## 2015-12-01 MED ORDER — AZITHROMYCIN 250 MG PO TABS
500.0000 mg | ORAL_TABLET | Freq: Once | ORAL | Status: AC
  Administered 2015-12-01: 500 mg via ORAL
  Filled 2015-12-01: qty 2

## 2015-12-01 MED ORDER — SODIUM CHLORIDE 0.9 % IV BOLUS (SEPSIS)
2000.0000 mL | Freq: Once | INTRAVENOUS | Status: AC
  Administered 2015-12-01: 2000 mL via INTRAVENOUS

## 2015-12-01 MED ORDER — IBUPROFEN 200 MG PO TABS: 400.0000 mg | ORAL_TABLET | Freq: Four times a day (QID) | ORAL | 0 refills | Status: DC | PRN

## 2015-12-01 MED ORDER — ACETAMINOPHEN 500 MG PO TABS
1000.0000 mg | ORAL_TABLET | Freq: Once | ORAL | Status: AC
  Administered 2015-12-01: 1000 mg via ORAL
  Filled 2015-12-01: qty 2

## 2015-12-01 MED ORDER — AZITHROMYCIN 250 MG PO TABS: 250.0000 mg | ORAL_TABLET | Freq: Every day | ORAL | 0 refills | Status: DC

## 2015-12-01 MED ORDER — DEXTROSE 5 % IV SOLN
1.0000 g | Freq: Once | INTRAVENOUS | Status: AC
  Administered 2015-12-01: 1 g via INTRAVENOUS
  Filled 2015-12-01: qty 10

## 2015-12-01 MED ORDER — ACETAMINOPHEN 325 MG PO TABS
650.0000 mg | ORAL_TABLET | Freq: Once | ORAL | Status: AC
  Administered 2015-12-01: 650 mg via ORAL
  Filled 2015-12-01: qty 2

## 2015-12-01 MED ORDER — SODIUM CHLORIDE 0.9 % IV BOLUS (SEPSIS)
1000.0000 mL | Freq: Once | INTRAVENOUS | Status: AC
  Administered 2015-12-01: 1000 mL via INTRAVENOUS

## 2015-12-01 MED ORDER — AMOXICILLIN 500 MG PO CAPS: 500.0000 mg | ORAL_CAPSULE | Freq: Three times a day (TID) | ORAL | 0 refills | Status: DC

## 2015-12-01 MED ORDER — IBUPROFEN 400 MG PO TABS
400.0000 mg | ORAL_TABLET | Freq: Once | ORAL | Status: AC
  Administered 2015-12-01: 400 mg via ORAL
  Filled 2015-12-01: qty 1

## 2015-12-01 NOTE — ED Notes (Signed)
Physician in to reassess 

## 2015-12-01 NOTE — ED Triage Notes (Signed)
Patient c/o generalized body aches, chills, nonproductive cough with shortness of breath. Patient seen here in ER recently seen in ER for abd pain and was given pepcid and hydrocodone. Patient told to return to ER if no improvement. Per patient worse. Patient's temp 102.8 and HR 135 in triage.

## 2015-12-01 NOTE — ED Provider Notes (Signed)
[facility] DEPT Provider Note   CSN: [id number] Arrival date & time: 12/01/15  1441  First Provider Contact:  None       History   Chief Complaint Chief Complaint  Patient presents with  . Generalized Body Aches    HPI Dominique Garza is a 30 y.o. female.   Pt started having cough, chills and chest pain on Friday.  Occasionally bringing up some mucus but the cough is often dry.  She has had some sore throat but no runny nose.  Her chest has been hurting and she has been dizzy.  Her whole body has been aching.  She has had a fever up to 102.  No abdominal pain, no urinary symptoms. No hx of DVT or PE. No syncope.  Past Medical History:  Diagnosis Date  . Acne 12/27/2014  . Anxiety   . Contraceptive management 10/15/2014  . Fibromyalgia   . Hives of unknown origin 12/27/2014  . HSV-2 seropositive   . Hypertension   . Postpartum examination following vaginal delivery 10/15/2014  . Pregnant 02/16/2014    Patient Active Problem List   Diagnosis Date Noted  . Acne 12/27/2014  . Hives of unknown origin 12/27/2014  . Pap smear for cervical cancer screening 11/26/2014  . Nexplanon in place 11/20/2014  . Gestational hypertension w/o significant proteinuria in 3rd trimester 09/01/2014  . Pica 08/02/2014  . HSV-2 seropositive 03/07/2014  . Hx of preeclampsia, prior pregnancy, currently pregnant 03/07/2014    Past Surgical History:  Procedure Laterality Date  . TONSILLECTOMY      OB History    Gravida Para Term Preterm AB Living   5 4 3 1 1 4    SAB TAB Ectopic Multiple Live Births   1     0         Home Medications    Prior to Admission medications   Medication Sig Start Date End Date Taking? Authorizing Provider  famotidine (PEPCID) 20 MG tablet Take 1 tablet (20 mg total) by mouth 2 (two) times daily. 11/21/15  Yes Vanetta Mulders, MD  HYDROcodone-acetaminophen (NORCO/VICODIN) 5-325 MG tablet Take 1-2 tablets by mouth every 6 (six) hours as needed. 11/21/15  Yes Vanetta Mulders, MD  norgestimate-ethinyl estradiol (ORTHO-CYCLEN,SPRINTEC,PREVIFEM) 0.25-35 MG-MCG tablet Take 1 tablet by mouth daily. 11/29/15  Yes [name], MD  promethazine (PHENERGAN) 25 MG tablet Take 1 tablet (25 mg total) by mouth every 6 (six) hours as needed. 11/21/15  Yes Vanetta Mulders, MD  amoxicillin (AMOXIL) 500 MG capsule Take 1 capsule (500 mg total) by mouth 3 (three) times daily. 12/01/15   Linwood Dibbles, MD  azithromycin (ZITHROMAX) 250 MG tablet Take 1 tablet (250 mg total) by mouth daily. Take first 2 tablets together, then 1 every day until finished. 12/01/15   Linwood Dibbles, MD  ibuprofen (ADVIL,MOTRIN) 200 MG tablet Take 2 tablets (400 mg total) by mouth every 6 (six) hours as needed for moderate pain. 12/01/15   Linwood Dibbles, MD    Family History Family History  Problem Relation Age of Onset  . Fibromyalgia Mother   . Sarcoidosis Mother   . Diabetes Paternal Grandmother   . Hypertension Father     Social History Social History  Substance Use Topics  . Smoking status: Never Smoker  . Smokeless tobacco: Never Used  . Alcohol use No     Allergies   Review of patient's allergies indicates no known allergies.   Review of Systems Review of Systems  Constitutional: Negative  for fever.  Respiratory: Positive for cough.   All other systems reviewed and are negative.    Physical Exam Updated Vital Signs BP 98/78 (BP Location: Left Arm)   Pulse 116   Temp 98 F (36.7 C) (Oral)   Resp 17   Ht 5\' 4"  (1.626 m)   Wt 102.1 kg   SpO2 100%   BMI 38.62 kg/m   Physical Exam  Constitutional: She appears well-developed and well-nourished. No distress.  HENT:  Head: Normocephalic and atraumatic.  Right Ear: External ear normal.  Left Ear: External ear normal.  Eyes: Conjunctivae are normal. Right eye exhibits no discharge. Left eye exhibits no discharge. No scleral icterus.  Neck: Neck supple. No tracheal deviation present.  Cardiovascular: Regular rhythm and intact  distal pulses.  Tachycardia present.   Pulmonary/Chest: Effort normal and breath sounds normal. No stridor. No respiratory distress. She has no wheezes. She has no rales.  Abdominal: Soft. Bowel sounds are normal. She exhibits no distension. There is no tenderness. There is no rebound and no guarding.  Musculoskeletal: She exhibits no edema or tenderness.  Neurological: She is alert. She has normal strength. No cranial nerve deficit (no facial droop, extraocular movements intact, no slurred speech) or sensory deficit. She exhibits normal muscle tone. She displays no seizure activity. Coordination normal.  Skin: Skin is warm and dry. No rash noted.  Psychiatric: She has a normal mood and affect.  Nursing note and vitals reviewed.    ED Treatments / Results  Labs (all labs ordered are listed, but only abnormal results are displayed) Labs Reviewed  CBC WITH DIFFERENTIAL/PLATELET - Abnormal; Notable for the following:       Result Value   WBC 14.4 (*)    Neutro Abs 12.5 (*)    All other components within normal limits  COMPREHENSIVE METABOLIC PANEL - Abnormal; Notable for the following:    Sodium 129 (*)    Chloride 98 (*)    Glucose, Bld 101 (*)    AST 77 (*)    ALT 77 (*)    All other components within normal limits  URINALYSIS, ROUTINE W REFLEX MICROSCOPIC (NOT AT [facility]) - Abnormal; Notable for the following:    Specific Gravity, Urine <1.005 (*)    All other components within normal limits  I-STAT CG4 LACTIC ACID, ED  I-STAT CG4 LACTIC ACID, ED     Radiology Dg Chest 2 View  Result Date: 12/01/2015 CLINICAL DATA:  Patient with chest pain, cough, fever and shortness of breath. EXAM: CHEST  2 VIEW COMPARISON:  Chest radiograph 05/08/2006 FINDINGS: Normal cardiac and mediastinal contours. Focal consolidation within the left perihilar location. Right lung is clear. No pleural effusion or pneumothorax. Regional skeleton is unremarkable. IMPRESSION: Focal consolidative opacity within  the left perihilar region, concerning for pneumonia in the appropriate clinical setting. Followup PA and lateral chest X-ray is recommended in 3-4 weeks following trial of antibiotic therapy to ensure resolution and exclude underlying malignancy. Electronically Signed   By: Annia Belt M.D.   On: 12/01/2015 15:34   Procedures Procedures (including critical care time)  Medications Ordered in ED Medications  acetaminophen (TYLENOL) tablet 1,000 mg (1,000 mg Oral Given 12/01/15 1458)  cefTRIAXone (ROCEPHIN) 1 g in dextrose 5 % 50 mL IVPB (0 g Intravenous Stopped 12/01/15 1914)  azithromycin (ZITHROMAX) tablet 500 mg (500 mg Oral Given 12/01/15 1843)  sodium chloride 0.9 % bolus 1,000 mL (0 mLs Intravenous Stopped 12/01/15 1924)  acetaminophen (TYLENOL) tablet 650 mg (650  mg Oral Given 12/01/15 1843)  benzonatate (TESSALON) capsule 200 mg (200 mg Oral Given 12/01/15 1843)  sodium chloride 0.9 % bolus 2,000 mL (2,000 mLs Intravenous New Bag/Given 12/01/15 2014)  ibuprofen (ADVIL,MOTRIN) tablet 400 mg (400 mg Oral Given 12/01/15 2035)     Initial Impression / Assessment and Plan / ED Course  I have reviewed the triage vital signs and the nursing notes.  Pertinent labs & imaging results that were available during my care of the patient were reviewed by me and considered in my medical decision making (see chart for details).  Clinical Course  Value Comment By Time  MCV: 85.9 (Reviewed) Linwood Dibbles, MD 07/23 1824  WBC: (!) 14.4 Elevated  Linwood Dibbles, MD 07/23 1825   CXR c/w pna.  Will start on abx Linwood Dibbles, MD 07/23 1828   Heart rate decreasing with IV fluids.  Lactic acid level not elevated.  Hyponatremia and mild increase in LFTs.  No abdominal TTP.  Given IV fluids Linwood Dibbles, MD 07/23 2006   Discussed findings with patient.  Feels better.  Plan on discharge   Linwood Dibbles, MD 07/23 2054   Patient presents with fever, tachycardia and cough. Laboratory tests show an elevated white blood cell count. Chest  x-ray shows pneumonia. We'll plan on IV antibiotics, fluid bolus and reassess.   Final Clinical Impressions(s) / ED Diagnoses   Final diagnoses:  CAP (community acquired pneumonia)    New Prescriptions New Prescriptions   AMOXICILLIN (AMOXIL) 500 MG CAPSULE    Take 1 capsule (500 mg total) by mouth 3 (three) times daily.   AZITHROMYCIN (ZITHROMAX) 250 MG TABLET    Take 1 tablet (250 mg total) by mouth daily. Take first 2 tablets together, then 1 every day until finished.     Linwood Dibbles, MD 12/01/15 2055

## 2016-12-01 ENCOUNTER — Emergency Department (HOSPITAL_COMMUNITY)
Admission: EM | Admit: 2016-12-01 | Discharge: 2016-12-02 | Disposition: A | Discharge status: Home / Self Care | Payer: Medicaid Other | Attending: Emergency Medicine | Admitting: Emergency Medicine

## 2016-12-01 ENCOUNTER — Emergency Department (HOSPITAL_COMMUNITY): Payer: Medicaid Other

## 2016-12-01 ENCOUNTER — Encounter (HOSPITAL_COMMUNITY): Payer: Self-pay | Admitting: Emergency Medicine

## 2016-12-01 DIAGNOSIS — M25571 Pain in right ankle and joints of right foot: Secondary | ICD-10-CM | POA: Insufficient documentation

## 2016-12-01 DIAGNOSIS — M79644 Pain in right finger(s): Secondary | ICD-10-CM | POA: Diagnosis not present

## 2016-12-01 DIAGNOSIS — I10 Essential (primary) hypertension: Secondary | ICD-10-CM | POA: Diagnosis not present

## 2016-12-01 DIAGNOSIS — M25511 Pain in right shoulder: Secondary | ICD-10-CM | POA: Insufficient documentation

## 2016-12-01 DIAGNOSIS — R52 Pain, unspecified: Secondary | ICD-10-CM

## 2016-12-01 MED ORDER — CYCLOBENZAPRINE HCL 10 MG PO TABS
5.0000 mg | ORAL_TABLET | Freq: Once | ORAL | Status: AC
  Administered 2016-12-01: 5 mg via ORAL
  Filled 2016-12-01: qty 1

## 2016-12-01 MED ORDER — KETOROLAC TROMETHAMINE 30 MG/ML IJ SOLN
30.0000 mg | Freq: Once | INTRAMUSCULAR | Status: AC
  Administered 2016-12-01: 30 mg via INTRAMUSCULAR
  Filled 2016-12-01: qty 1
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
## 2016-12-02 MED ORDER — CYCLOBENZAPRINE HCL 5 MG PO TABS: 5.0000 mg | ORAL_TABLET | Freq: Two times a day (BID) | ORAL | 0 refills | Status: DC | PRN

## 2016-12-02 MED ORDER — NAPROXEN 500 MG PO TABS: 500.0000 mg | ORAL_TABLET | Freq: Two times a day (BID) | ORAL | 0 refills | Status: DC
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
## 2017-04-08 ENCOUNTER — Ambulatory Visit: Payer: Self-pay | Admitting: Adult Health

## 2017-04-14 ENCOUNTER — Other Ambulatory Visit: Payer: Self-pay

## 2017-04-14 ENCOUNTER — Ambulatory Visit: Payer: Medicaid Other | Admitting: Adult Health

## 2017-04-14 ENCOUNTER — Encounter: Payer: Self-pay | Admitting: Adult Health

## 2017-04-14 VITALS — BP 112/70 | HR 93 | Ht 64.0 in | Wt 232.0 lb

## 2017-04-14 DIAGNOSIS — Z3202 Encounter for pregnancy test, result negative: Secondary | ICD-10-CM | POA: Diagnosis not present

## 2017-04-14 DIAGNOSIS — N926 Irregular menstruation, unspecified: Secondary | ICD-10-CM | POA: Diagnosis not present

## 2017-04-14 DIAGNOSIS — Z3169 Encounter for other general counseling and advice on procreation: Secondary | ICD-10-CM

## 2017-04-14 DIAGNOSIS — Z319 Encounter for procreative management, unspecified: Secondary | ICD-10-CM

## 2017-04-14 LAB — POCT URINE PREGNANCY: PREG TEST UR: NEGATIVE

## 2017-04-14 NOTE — Progress Notes (Signed)
Subjective:     Patient ID: Thomasenia SalesJanay J Lollis, female   DOB: 08-Jul-1985, 31 y.o.   MRN: [id number]  HPI Marijo ConceptionJanay is a 31 year old black female in for UPT, has not had period since May and has had + and - HPT, would like to be pregnant one more time.   Review of Systems +irregular periods +missed period, had 1 +HPT and - HPT Reviewed past medical,surgical, social and family history. Reviewed medications and allergies.     Objective:   Physical Exam BP 112/70 (BP Location: Right Arm, Patient Position: Sitting, Cuff Size: Large)   Pulse 93   Ht 5\' 4"  (1.626 m)   Wt 232 lb (105.2 kg)   LMP 09/12/2016 (Approximate)   BMI 39.82 kg/m UPT negative, will check QHCG today, she would like to get pregnant one more time.     Assessment:     1. Missed periods   2. Irregular periods   3. Pregnancy examination or test, negative result   4. Patient desires pregnancy       Plan:     [facility]t [facility]13 for pap and physical

## 2017-04-15 ENCOUNTER — Telehealth: Payer: Self-pay | Admitting: Adult Health

## 2017-04-15 LAB — BETA HCG QUANT (REF LAB): hCG Quant: <1 m[IU]/mL

## 2017-04-15 NOTE — Telephone Encounter (Signed)
Pt aware QHCG <1, keep appt next week

## 2017-04-22 ENCOUNTER — Other Ambulatory Visit: Payer: Self-pay | Admitting: Adult Health

## 2017-06-15 ENCOUNTER — Ambulatory Visit (INDEPENDENT_AMBULATORY_CARE_PROVIDER_SITE_OTHER): Payer: Medicaid Other | Admitting: Adult Health

## 2017-06-15 ENCOUNTER — Encounter: Payer: Self-pay | Admitting: Adult Health

## 2017-06-15 VITALS — BP 118/70 | HR 87 | Ht 64.0 in | Wt 239.0 lb

## 2017-06-15 DIAGNOSIS — Z319 Encounter for procreative management, unspecified: Secondary | ICD-10-CM | POA: Insufficient documentation

## 2017-06-15 DIAGNOSIS — N926 Irregular menstruation, unspecified: Secondary | ICD-10-CM | POA: Insufficient documentation

## 2017-06-15 NOTE — Progress Notes (Signed)
Subjective:     Patient ID: Thomasenia SalesJanay J Curfman, female   DOB: Apr 04, 1986, 32 y.o.   MRN: [id number]  HPI Marijo ConceptionJanay is a 32 year old black female in to discuss getting pregnant, wants to try clomid, because periods not regular and has been trying over a year with partner of 6 years and they have 1 child together.She works for [employer].    Review of Systems +irregular periods Wants 1 more baby Reviewed past medical,surgical, social and family history. Reviewed medications and allergies.     Objective:   Physical Exam BP 118/70 (BP Location: Left Arm, Patient Position: Sitting, Cuff Size: Large)   Pulse 87   Ht 5\' 4"  (1.626 m)   Wt 239 lb (108.4 kg)   BMI 41.02 kg/m    Talk only: needs pap and physical, and will talk more about clomid then, will give handout on clomid.  Assessment:     1. Irregular periods   2. Patient desires pregnancy       Plan:       Review handout on clomid Return 2/18 for pap and physical

## 2017-06-15 NOTE — Patient Instructions (Signed)
Clomiphene tablets What is this medicine? CLOMIPHENE (KLOE mi feen) is a fertility drug that increases the chance of pregnancy. It helps women ovulate (produce a mature egg) during their cycle. This medicine may be used for other purposes; ask your health care provider or pharmacist if you have questions. COMMON BRAND NAME(S): Clomid, Serophene What should I tell my health care provider before I take this medicine? They need to know if you have any of these conditions: -adrenal gland disease -blood vessel disease or blood clots -cyst on the ovary -endometriosis -liver disease -ovarian cancer -pituitary gland disease -vaginal bleeding that has not been evaluated -an unusual or allergic reaction to clomiphene, other medicines, foods, dyes, or preservatives -pregnant (should not be used if you are already pregnant) -breast-feeding How should I use this medicine? Take this medicine by mouth with a glass of water. Follow the directions on the prescription label. Take exactly as directed for the exact number of days prescribed. Take your doses at regular intervals. Most women take this medicine for a 5 day period, but the length of treatment may be adjusted. Your doctor will give you a start date for this medication and will give you instructions on proper use. Do not take your medicine more often than directed. Talk to your pediatrician regarding the use of this medicine in children. Special care may be needed. Overdosage: If you think you have taken too much of this medicine contact a poison control center or emergency room at once. NOTE: This medicine is only for you. Do not share this medicine with others. What if I miss a dose? If you miss a dose, take it as soon as you can. If it is almost time for your next dose, take only that dose. Do not take double or extra doses. What may interact with this medicine? -herbal or dietary supplements, like blue cohosh, black cohosh, chasteberry, or  DHEA -prasterone This list may not describe all possible interactions. Give your health care provider a list of all the medicines, herbs, non-prescription drugs, or dietary supplements you use. Also tell them if you smoke, drink alcohol, or use illegal drugs. Some items may interact with your medicine. What should I watch for while using this medicine? Make sure you understand how and when to use this medicine. You need to know when you are ovulating and when to have sexual intercourse. This will increase the chance of a pregnancy. Visit your doctor or health care professional for regular checks on your progress. You may need tests to check the hormone levels in your blood or you may have to use home-urine tests to check for ovulation. Try to keep any appointments. Compared to other fertility treatments, this medicine does not greatly increase your chances of having multiple babies. An increased chance of having twins may occur in roughly 5 out of every 100 women who take this medication. Stop taking this medicine at once and contact your doctor or health care professional if you think you are pregnant. This medicine is not for long-term use. Most women that benefit from this medicine do so within the first three cycles (months). Your doctor or health care professional will monitor your condition. This medicine is usually used for a total of 6 cycles of treatment. You may get drowsy or dizzy. Do not drive, use machinery, or do anything that needs mental alertness until you know how this drug affects you. Do not stand or sit up quickly. This reduces the risk of dizzy or   fainting spells. Drinking alcoholic beverages or smoking tobacco may decrease your chance of becoming pregnant. Limit or stop alcohol and tobacco use during your fertility treatments. What side effects may I notice from receiving this medicine? Side effects that you should report to your doctor or health care professional as soon as  possible: -allergic reactions like skin rash, itching or hives, swelling of the face, lips, or tongue -breathing problems -changes in vision -fluid retention -nausea, vomiting -pelvic pain or bloating -severe abdominal pain -sudden weight gain Side effects that usually do not require medical attention (report to your doctor or health care professional if they continue or are bothersome): -breast discomfort -hot flashes -mild pelvic discomfort -mild nausea This list may not describe all possible side effects. Call your doctor for medical advice about side effects. You may report side effects to FDA at 1-800-FDA-1088. Where should I keep my medicine? Keep out of the reach of children. Store at room temperature between 15 and 30 degrees C (59 and 86 degrees F). Protect from heat, light, and moisture. Throw away any unused medicine after the expiration date. NOTE: This sheet is a summary. It may not cover all possible information. If you have questions about this medicine, talk to your doctor, pharmacist, or health care provider.  2018 Elsevier/Gold Standard (2007-08-08 22:21:06)  

## 2017-06-28 ENCOUNTER — Other Ambulatory Visit: Payer: Medicaid Other | Admitting: Adult Health

## 2017-07-14 ENCOUNTER — Other Ambulatory Visit: Payer: Medicaid Other | Admitting: Adult Health

## 2017-11-23 ENCOUNTER — Encounter (HOSPITAL_COMMUNITY): Payer: Self-pay | Admitting: Emergency Medicine

## 2017-11-23 ENCOUNTER — Emergency Department (HOSPITAL_COMMUNITY): Payer: PRIVATE HEALTH INSURANCE

## 2017-11-23 ENCOUNTER — Emergency Department (HOSPITAL_COMMUNITY)
Admission: EM | Admit: 2017-11-23 | Discharge: 2017-11-23 | Disposition: A | Discharge status: Home / Self Care | Payer: PRIVATE HEALTH INSURANCE | Attending: Emergency Medicine | Admitting: Emergency Medicine

## 2017-11-23 DIAGNOSIS — R079 Chest pain, unspecified: Secondary | ICD-10-CM | POA: Diagnosis not present

## 2017-11-23 DIAGNOSIS — Z5321 Procedure and treatment not carried out due to patient leaving prior to being seen by health care provider: Secondary | ICD-10-CM | POA: Diagnosis not present

## 2017-11-23 LAB — I-STAT TROPONIN, ED: TROPONIN I, POC: 0.04 ng/mL (ref 0.00–0.08)

## 2017-11-23 LAB — BASIC METABOLIC PANEL
ANION GAP: 9 (ref 5–15)
BUN: 15 mg/dL (ref 6–20)
CALCIUM: 9.2 mg/dL (ref 8.9–10.3)
CHLORIDE: 104 mmol/L (ref 98–111)
CO2: 22 mmol/L (ref 22–32)
Creatinine, Ser: 0.94 mg/dL (ref 0.44–1.00)
GFR calc Af Amer: >60 mL/min (ref >60)
GFR calc non Af Amer: >60 mL/min (ref >60)
Glucose, Bld: 101 mg/dL — ABNORMAL HIGH (ref 70–99)
POTASSIUM: 3.8 mmol/L (ref 3.5–5.1)
Sodium: 135 mmol/L (ref 135–145)

## 2017-11-23 LAB — CBC
HEMATOCRIT: 35.1 % — AB (ref 36.0–46.0)
HEMOGLOBIN: 11.5 g/dL — AB (ref 12.0–15.0)
MCH: 28.1 pg (ref 26.0–34.0)
MCHC: 32.8 g/dL (ref 30.0–36.0)
MCV: 85.8 fL (ref 78.0–100.0)
Platelets: 179 10*3/uL (ref 150–400)
RBC: 4.09 MIL/uL (ref 3.87–5.11)
RDW: 13.6 % (ref 11.5–15.5)
WBC: 8.2 10*3/uL (ref 4.0–10.5)

## 2017-11-23 LAB — I-STAT BETA HCG BLOOD, ED (MC, WL, AP ONLY): I-stat hCG, quantitative: <5 m[IU]/mL (ref <5)

## 2017-11-23 MED ORDER — ALBUTEROL SULFATE (2.5 MG/3ML) 0.083% IN NEBU: 2.5000 mg | INHALATION_SOLUTION | Freq: Once | RESPIRATORY_TRACT | Status: DC

## 2017-11-23 NOTE — ED Notes (Signed)
Pt came to desk and informed RN that they were leaving.  RN informed them that they had a room assigned and it was being cleaned however they said they had to leave.

## 2017-11-23 NOTE — ED Triage Notes (Signed)
Patient from home, woke up with chest wall pain, hurts to palpation, hurts to take a deep breath.  Patient is hyperventilating upon arrival to ED.  Patient is moaning due to pain with movement.

## 2017-11-25 ENCOUNTER — Encounter: Payer: Self-pay | Admitting: Advanced Practice Midwife

## 2017-11-25 ENCOUNTER — Other Ambulatory Visit (HOSPITAL_COMMUNITY)
Admission: RE | Admit: 2017-11-25 | Discharge: 2017-11-25 | Disposition: A | Discharge status: Home / Self Care | Payer: PRIVATE HEALTH INSURANCE | Source: Ambulatory Visit | Attending: Adult Health | Admitting: Adult Health

## 2017-11-25 ENCOUNTER — Ambulatory Visit (INDEPENDENT_AMBULATORY_CARE_PROVIDER_SITE_OTHER): Payer: PRIVATE HEALTH INSURANCE | Admitting: Advanced Practice Midwife

## 2017-11-25 VITALS — BP 124/81 | HR 110 | Temp 100.3°F | Ht 64.5 in | Wt 243.0 lb

## 2017-11-25 DIAGNOSIS — Z01419 Encounter for gynecological examination (general) (routine) without abnormal findings: Secondary | ICD-10-CM | POA: Diagnosis present

## 2017-11-25 DIAGNOSIS — N914 Secondary oligomenorrhea: Secondary | ICD-10-CM | POA: Diagnosis not present

## 2017-11-25 DIAGNOSIS — N915 Oligomenorrhea, unspecified: Secondary | ICD-10-CM | POA: Insufficient documentation

## 2017-11-25 DIAGNOSIS — Z124 Encounter for screening for malignant neoplasm of cervix: Secondary | ICD-10-CM

## 2017-11-25 MED ORDER — PREDNISONE 10 MG PO TABS: 10.0000 mg | ORAL_TABLET | Freq: Every day | ORAL | 0 refills | Status: DC

## 2017-11-25 MED ORDER — PNV PRENATAL PLUS MULTIVITAMIN 27-1 MG PO TABS: 1.0000 | ORAL_TABLET | Freq: Every day | ORAL | 11 refills | Status: DC

## 2017-11-25 MED ORDER — MEDROXYPROGESTERONE ACETATE 10 MG PO TABS: 10.0000 mg | ORAL_TABLET | Freq: Every day | ORAL | 0 refills | Status: DC

## 2017-11-25 MED ORDER — CLOMIPHENE CITRATE 50 MG PO TABS: 50.0000 mg | ORAL_TABLET | Freq: Every day | ORAL | 3 refills | Status: DC

## 2017-11-25 NOTE — Patient Instructions (Addendum)
CLOMID INSTRUCTIONS  WHY USE IT? Clomid helps your ovaries to release eggs (ovulate).  HOW TO USE IT? Clomid is taken as a pill usually on days 1-5 of your cycle.  Day 1 is the first day of your period. The dose or duration may be changed to achieve ovulation.  Provera (progesterone) may first be used to bring on a period for some patients. The day of ovulation on Clomid is usually between cycle day 14 and 17.  Having sexual intercourse at least every other day between cycle day 13 and 18 will improve your chances of becoming pregnant during the Clomid cycle.  You may monitor your ovulation using basal body temperature charts or with ovulation kits.  If using the ovulation predictor kits, having intercourse the day of the surge and the two days following is recommended. If you get your period, call when it starts for an appointment with your doctor, so that an exam may be done, and another Clomid cycle can be considered if appropriate. If you do not get a period by day 35 of the cycle, please get a blood pregnancy test.  If it is negative, speak to your doctor for instructions to bring on another period and to plan a follow-up appointment.  THINGS TO KNOW: If you get pregnant while using Clomid, your chance of twins is 7%m and triplets is less than 1%. Some studies have suggested the use of "fertility drugs" may increase your risk of ovarian cancers in the future.  It is unclear if these drugs increase the risk, or people who have problems with fertility are prone for these cancers.  If there is an actual risk, it is very low.  If you have a history of liver problems or ovarian cancer, it may be wise to avoid this medication.  SIDE EFFECTS:  The most common side effect is hot flashes (20%).  Breast tenderness, headaches, nausea, bloating may also occur at different times.  Less than 3/1,000 people have dryness or loss of hair.  Persistent ovarian cysts may form from the use of this  medication.  Ovarian hyperstimulation syndrome is a rare side effect at low doses.  Visual changes like flashes of light or blurring.

## 2017-11-25 NOTE — Progress Notes (Signed)
Dominique SalesJanay J Garza 32 y.o.  Vitals:   11/25/17 1128  BP: 124/81  Pulse: (!) 110  Temp: 100.3 F (37.9 C)     Filed Weights   11/25/17 1128  Weight: 243 lb (110.2 kg)    Past Medical History: Past Medical History:  Diagnosis Date  . Acne 12/27/2014  . Anxiety   . Contraceptive management 10/15/2014  . Fibromyalgia   . Hives of unknown origin 12/27/2014  . HSV-2 seropositive   . Hypertension   . Postpartum examination following vaginal delivery 10/15/2014  . Pregnant 02/16/2014    Past Surgical History: Past Surgical History:  Procedure Laterality Date  . TONSILLECTOMY      Family History: Family History  Problem Relation Age of Onset  . Fibromyalgia Mother   . Sarcoidosis Mother   . Diabetes Paternal Grandmother   . Hypertension Father   . Diabetes Father     Social History: Social History   Tobacco Use  . Smoking status: Never Smoker  . Smokeless tobacco: Never Used  Substance Use Topics  . Alcohol use: No  . Drug use: No    Allergies: No Known Allergies   No current outpatient medications on file.  History of Present Illness: here for pap and physical.  Had discussed clomid w/JAG back in Feb.  Periods are absent. She says she has never had a period in her entire life.  Had a BTB once when she had nexplanon. Has been on various BC w/o withdrawal bleeds, yet has gotten pregnant 4 times. W/o fertility tx.has been having unprotected sex for years, but not regularly. Wants to try clomid.   Has had URI sx since Monday.  Discussed when abx would be appropriate, gave rx to take home since it's the weekend coming up. Prednisone for wheezing  Review of Systems   Patient denies any headaches, blurred vision, shortness of breath, chest pain, abdominal pain, problems with bowel movements, urination, or intercourse.   Physical Exam: General:  Well developed, well nourished, no acute distress Skin:  Warm and dry Neck:  Midline trachea, normal thyroid Lungs; Clear to  auscultation bilaterally except for tiny exp wheezing in right ll Breast:  No dominant palpable mass, retraction, or nipple discharge Cardiovascular: Regular rate and rhythm Abdomen:  Soft, non tender, no hepatosplenomegaly Pelvic:  External genitalia is normal in appearance.  The vagina is normal in appearance.  The cervix is bulbous.  Uterus is felt to be normal size, shape, and contour.  No adnexal masses or tenderness noted. exam limited by habitusExtremities:  No swelling or varicosities noted Psych:  No mood changes.     Impression: normal GYN exam Pt states she has never had a single period in her life--discussed need for either bc or pg challenges in the future    Plan: clomid day 1 of WD bleed; if no bleed or pregnancy after 3 mo nths, increase clomid

## 2017-11-26 LAB — CYTOLOGY - PAP
ADEQUACY: ABSENT
CHLAMYDIA, DNA PROBE: NEGATIVE
DIAGNOSIS: NEGATIVE
HPV: NOT DETECTED
NEISSERIA GONORRHEA: NEGATIVE

## 2019-09-21 ENCOUNTER — Ambulatory Visit: Payer: PRIVATE HEALTH INSURANCE | Attending: Internal Medicine

## 2019-09-21 ENCOUNTER — Other Ambulatory Visit: Payer: Self-pay

## 2019-09-21 DIAGNOSIS — Z20822 Contact with and (suspected) exposure to covid-19: Secondary | ICD-10-CM

## 2019-09-22 LAB — NOVEL CORONAVIRUS, NAA: SARS-CoV-2, NAA: DETECTED — AB

## 2019-09-22 LAB — SARS-COV-2, NAA 2 DAY TAT

## 2019-09-23 ENCOUNTER — Telehealth: Payer: Self-pay | Admitting: Physician Assistant

## 2019-09-23 NOTE — Telephone Encounter (Signed)
Called to discuss with Dominique Garza about Covid symptoms and the use of bamlanivimab/etesevimab or casirivimab/imdevimab, a monoclonal antibody infusion for those with mild to moderate Covid symptoms and at a high risk of hospitalization.     Pt is qualified for this infusion at the [facility] infusion center due to co-morbid conditions (BMI >35) however, pt does not qualify for infusion therapy as pt has asymptomatic infection. Isolation precautions discussed. Advised to contact back for consideration should they develop symptoms. Patient verbalized understanding.    She has out hotline number in the case that she develops symptoms. ( she got tested bc her daughter was feeling sick and she works in Teacher, music). She has not been vaccinated.    Patient Active Problem List   Diagnosis Date Noted  . Oligomenorrhea 11/25/2017  . Irregular periods 06/15/2017  . Patient desires pregnancy 06/15/2017  . Acne 12/27/2014  . Hives of unknown origin 12/27/2014  . Pap smear for cervical cancer screening 11/26/2014  . HSV-2 seropositive 03/07/2014  . Hx of preeclampsia, prior pregnancy, currently pregnant 03/07/2014    Cline Crock PA-C

## 2019-10-19 ENCOUNTER — Ambulatory Visit: Payer: PRIVATE HEALTH INSURANCE | Admitting: Family Medicine

## 2020-02-28 ENCOUNTER — Ambulatory Visit
Admission: EM | Admit: 2020-02-28 | Discharge: 2020-02-28 | Disposition: A | Discharge status: Home / Self Care | Payer: PRIVATE HEALTH INSURANCE | Attending: Emergency Medicine | Admitting: Emergency Medicine

## 2020-02-28 ENCOUNTER — Ambulatory Visit (INDEPENDENT_AMBULATORY_CARE_PROVIDER_SITE_OTHER): Payer: PRIVATE HEALTH INSURANCE

## 2020-02-28 ENCOUNTER — Encounter: Payer: Self-pay | Admitting: Emergency Medicine

## 2020-02-28 DIAGNOSIS — M25561 Pain in right knee: Secondary | ICD-10-CM

## 2020-02-28 DIAGNOSIS — S8991XA Unspecified injury of right lower leg, initial encounter: Secondary | ICD-10-CM

## 2020-02-28 MED ORDER — NAPROXEN 500 MG PO TABS: 500.0000 mg | ORAL_TABLET | Freq: Two times a day (BID) | ORAL | 0 refills | Status: DC

## 2020-02-28 NOTE — Discharge Instructions (Signed)
X-rays negative for fracture of dislocation Continue conservative management of rest, ice, and elevation Ace bandage applied Take naproxen as needed for pain relief (may cause abdominal discomfort, ulcers, and GI bleeds avoid taking with other NSAIDs) Follow up with orthopedist for further evaluation and management Return or go to the ER if you have any new or worsening symptoms (fever, chills, chest pain, redness, swelling, bruising, deformity, etc...)

## 2020-02-28 NOTE — ED Triage Notes (Signed)
RT knee pain after falling for past few days

## 2020-02-28 NOTE — ED Provider Notes (Signed)
[facility]   [id number] 02/28/20 Arrival Time: 1112  CC: Right knee pain  SUBJECTIVE: History from: patient. Dominique Garza is a 34 y.o. female complains of RT knee pain and injury x past few day.  Stepped in hole and fall on knee.  Pain is diffuse about the knee.  Describes the pain as intermittent and sharp, and crunching in character.  Has tried OTC medications without relief.  Symptoms are made worse with weight-bearing, and ROM.  Report RT knee in the past.  Denies fever, chills, erythema, ecchymosis, effusion, weakness, numbness and tingling.  ROS: As per HPI.  All other pertinent ROS negative.     Past Medical History:  Diagnosis Date  . Acne 12/27/2014  . Anxiety   . Contraceptive management 10/15/2014  . Fibromyalgia   . Hives of unknown origin 12/27/2014  . HSV-2 seropositive   . Hypertension   . Postpartum examination following vaginal delivery 10/15/2014  . Pregnant 02/16/2014   Past Surgical History:  Procedure Laterality Date  . TONSILLECTOMY     No Known Allergies No current facility-administered medications on file prior to encounter.   Current Outpatient Medications on File Prior to Encounter  Medication Sig Dispense Refill  . clomiPHENE (CLOMID) 50 MG tablet Take 1 tablet (50 mg total) by mouth daily. 10 tablet 3  . medroxyPROGESTERone (PROVERA) 10 MG tablet Take 1 tablet (10 mg total) by mouth daily. 10 tablet 0  . predniSONE (DELTASONE) 10 MG tablet Take 1 tablet (10 mg total) by mouth daily with breakfast. 10 tablet 0  . Prenatal Vit-Fe Fumarate-FA (PNV PRENATAL PLUS MULTIVITAMIN) 27-1 MG TABS Take 1 tablet by mouth daily. 30 tablet 11   Social History   Socioeconomic History  . Marital status: Married    Spouse name: Not on file  . Number of children: Not on file  . Years of education: Not on file  . Highest education level: Not on file  Occupational History  . Not on file  Tobacco Use  . Smoking status: Never Smoker  . Smokeless tobacco:  Never Used  Substance and Sexual Activity  . Alcohol use: No  . Drug use: No  . Sexual activity: Yes    Birth control/protection: None  Other Topics Concern  . Not on file  Social History Narrative  . Not on file   Social Determinants of Health   Financial Resource Strain:   . Difficulty of Paying Living Expenses: Not on file  Food Insecurity:   . Worried About Programme researcher, broadcasting/film/video in the Last Year: Not on file  . Ran Out of Food in the Last Year: Not on file  Transportation Needs:   . Lack of Transportation (Medical): Not on file  . Lack of Transportation (Non-Medical): Not on file  Physical Activity:   . Days of Exercise per Week: Not on file  . Minutes of Exercise per Session: Not on file  Stress:   . Feeling of Stress : Not on file  Social Connections:   . Frequency of Communication with Friends and Family: Not on file  . Frequency of Social Gatherings with Friends and Family: Not on file  . Attends Religious Services: Not on file  . Active Member of Clubs or Organizations: Not on file  . Attends Banker Meetings: Not on file  . Marital Status: Not on file  Intimate Partner Violence:   . Fear of Current or Ex-Partner: Not on file  . Emotionally Abused: Not on  file  . Physically Abused: Not on file  . Sexually Abused: Not on file   Family History  Problem Relation Age of Onset  . Fibromyalgia Mother   . Sarcoidosis Mother   . Diabetes Paternal Grandmother   . Hypertension Father   . Diabetes Father     OBJECTIVE:  Vitals:   02/28/20 1144  BP: 137/86  Pulse: 66  Resp: 16  Temp: 98 F (36.7 C)  TempSrc: Oral  SpO2: 97%    General appearance: ALERT; in no acute distress.  Head: NCAT Lungs: Normal respiratory effort Musculoskeletal: RT knee Inspection: Skin warm, dry, clear and intact without obvious erythema, effusion, or ecchymosis.  Palpation: diffusely TTP ROM: FROM active and passive Strength: 5/5 knee flexion, 5/5 knee  extension Skin: warm and dry Neurologic: Ambulates with antalgic gait  Psychological: alert and cooperative; normal mood and affect  DIAGNOSTIC STUDIES:  DG Knee Complete 4 Views Right  Result Date: 02/28/2020 CLINICAL DATA:  Pain following fall EXAM: RIGHT KNEE - COMPLETE 4+ VIEW COMPARISON:  None. FINDINGS: Frontal, lateral, and bilateral oblique views were obtained. No fracture or dislocation. No appreciable joint effusion. There is mild narrowing of the patellofemoral joint. Other joint spaces appear normal. No erosive change. IMPRESSION: Mild narrowing patellofemoral joint. No fracture, dislocation, or joint effusion. Electronically Signed   By: Bretta Bang III M.D.   On: 02/28/2020 12:07    X-rays negative for bony abnormalities including fracture, or dislocation.  No soft tissue swelling.    I have reviewed the x-rays myself and the radiologist interpretation. I am in agreement with the radiologist interpretation.     ASSESSMENT & PLAN:  1. Acute pain of right knee   2. Injury of right knee, initial encounter    Meds ordered this encounter  Medications  . naproxen (NAPROSYN) 500 MG tablet    Sig: Take 1 tablet (500 mg total) by mouth 2 (two) times daily.    Dispense:  30 tablet    Refill:  0    Order Specific Question:   Supervising Provider    Answer:   Eustace Moore [[id number]]   X-rays negative for fracture of dislocation Continue conservative management of rest, ice, and elevation Ace bandage applied Take naproxen as needed for pain relief (may cause abdominal discomfort, ulcers, and GI bleeds avoid taking with other NSAIDs) Follow up with orthopedist for further evaluation and management Return or go to the ER if you have any new or worsening symptoms (fever, chills, chest pain, redness, swelling, bruising, deformity, etc...)   Reviewed expectations re: course of current medical issues. Questions answered. Outlined signs and symptoms indicating need for more  acute intervention. Patient verbalized understanding. After Visit Summary given.    Dominique Harding, PA-C 02/28/20 1213

## 2020-03-05 ENCOUNTER — Other Ambulatory Visit: Payer: Self-pay

## 2020-03-05 ENCOUNTER — Encounter: Payer: Self-pay | Admitting: Orthopaedic Surgery

## 2020-03-05 ENCOUNTER — Ambulatory Visit (INDEPENDENT_AMBULATORY_CARE_PROVIDER_SITE_OTHER): Payer: PRIVATE HEALTH INSURANCE | Admitting: Orthopaedic Surgery

## 2020-03-05 VITALS — BP 150/92 | HR 77 | Ht 64.5 in | Wt 250.0 lb

## 2020-03-05 DIAGNOSIS — W1842XA Slipping, tripping and stumbling without falling due to stepping into hole or opening, initial encounter: Secondary | ICD-10-CM

## 2020-03-05 DIAGNOSIS — Z6841 Body Mass Index (BMI) 40.0 and over, adult: Secondary | ICD-10-CM | POA: Diagnosis not present

## 2020-03-05 DIAGNOSIS — M25561 Pain in right knee: Secondary | ICD-10-CM

## 2020-03-05 NOTE — Progress Notes (Signed)
Subjective:    Patient ID: Dominique Garza, female    DOB: 04/02/1986, 34 y.o.   MRN: [id number]  HPI She stepped in a hole and hurt her right knee on 02-26-20.  She was seen in Urgent Care 02-28-20.  She had pain of the right knee, giving way, swelling.  X-rays were done then.  No fracture was seen.  She was given Naprosyn and told to come here.  She had no other trauma.  She cannot fully flex the knee and has difficulty getting to 5 degrees. She cannot fully extend the knee.  She has medial joint line pain and swelling.  She has no redness.  The Naprosyn has helped some.   Review of Systems  Constitutional: Positive for activity change.  Musculoskeletal: Positive for arthralgias, gait problem and joint swelling.  All other systems reviewed and are negative.  For Review of Systems, all other systems reviewed and are negative.  The following is a summary of the past history medically, past history surgically, known current medicines, social history and family history.  This information is gathered electronically by the computer from prior information and documentation.  I review this each visit and have found including this information at this point in the chart is beneficial and informative.   Past Medical History:  Diagnosis Date  . Acne 12/27/2014  . Anxiety   . Contraceptive management 10/15/2014  . Fibromyalgia   . Hives of unknown origin 12/27/2014  . HSV-2 seropositive   . Hypertension   . Postpartum examination following vaginal delivery 10/15/2014  . Pregnant 02/16/2014    Past Surgical History:  Procedure Laterality Date  . TONSILLECTOMY      Current Outpatient Medications on File Prior to Visit  Medication Sig Dispense Refill  . naproxen (NAPROSYN) 500 MG tablet Take 1 tablet (500 mg total) by mouth 2 (two) times daily. 30 tablet 0  . Prenatal Vit-Fe Fumarate-FA (PNV PRENATAL PLUS MULTIVITAMIN) 27-1 MG TABS Take 1 tablet by mouth daily. 30 tablet 11   No current  facility-administered medications on file prior to visit.    Social History   Socioeconomic History  . Marital status: Married    Spouse name: Not on file  . Number of children: Not on file  . Years of education: Not on file  . Highest education level: Not on file  Occupational History  . Not on file  Tobacco Use  . Smoking status: Never Smoker  . Smokeless tobacco: Never Used  Substance and Sexual Activity  . Alcohol use: No  . Drug use: No  . Sexual activity: Yes    Birth control/protection: None  Other Topics Concern  . Not on file  Social History Narrative  . Not on file   Social Determinants of Health   Financial Resource Strain:   . Difficulty of Paying Living Expenses: Not on file  Food Insecurity:   . Worried About Programme researcher, broadcasting/film/video in the Last Year: Not on file  . Ran Out of Food in the Last Year: Not on file  Transportation Needs:   . Lack of Transportation (Medical): Not on file  . Lack of Transportation (Non-Medical): Not on file  Physical Activity:   . Days of Exercise per Week: Not on file  . Minutes of Exercise per Session: Not on file  Stress:   . Feeling of Stress : Not on file  Social Connections:   . Frequency of Communication with Friends and Family: Not on file  .  Frequency of Social Gatherings with Friends and Family: Not on file  . Attends Religious Services: Not on file  . Active Member of Clubs or Organizations: Not on file  . Attends Banker Meetings: Not on file  . Marital Status: Not on file  Intimate Partner Violence:   . Fear of Current or Ex-Partner: Not on file  . Emotionally Abused: Not on file  . Physically Abused: Not on file  . Sexually Abused: Not on file    Family History  Problem Relation Age of Onset  . Fibromyalgia Mother   . Sarcoidosis Mother   . Diabetes Paternal Grandmother   . Hypertension Father   . Diabetes Father     BP (!) 150/92   Pulse 77   Ht 5' 4.5" (1.638 m)   Wt 250 lb (113.4 kg)    BMI 42.25 kg/m   Body mass index is 42.25 kg/m.      Objective:   Physical Exam Vitals and nursing note reviewed. Exam conducted with a chaperone present.  Constitutional:      Appearance: She is well-developed.  HENT:     Head: Normocephalic and atraumatic.  Eyes:     Conjunctiva/sclera: Conjunctivae normal.     Pupils: Pupils are equal, round, and reactive to light.  Cardiovascular:     Rate and Rhythm: Normal rate and regular rhythm.  Pulmonary:     Effort: Pulmonary effort is normal.  Abdominal:     Palpations: Abdomen is soft.  Musculoskeletal:     Cervical back: Normal range of motion and neck supple.       Legs:  Skin:    General: Skin is warm and dry.  Neurological:     Mental Status: She is alert and oriented to person, place, and time.     Cranial Nerves: No cranial nerve deficit.     Motor: No abnormal muscle tone.     Coordination: Coordination normal.     Deep Tendon Reflexes: Reflexes are normal and symmetric. Reflexes normal.  Psychiatric:        Behavior: Behavior normal.        Thought Content: Thought content normal.        Judgment: Judgment normal.   I have independently reviewed and interpreted x-rays of this patient done at another site by another physician or qualified health professional.          Assessment & Plan:   Encounter Diagnoses  Name Primary?  . Acute pain of right knee Yes  . Body mass index 40.0-44.9, adult (HCC)   . Morbid obesity (HCC)    I am very concerned about a medial meniscus tear of the right knee.  I would like to get a MRI of the right knee.  PROCEDURE NOTE:  The patient requests injections of the right knee , verbal consent was obtained.  The right knee was prepped appropriately after time out was performed.   Sterile technique was observed and injection of 1 cc of Depo-Medrol 40 mg with several cc's of plain xylocaine. Anesthesia was provided by ethyl chloride and a 20-gauge needle was used to inject  the knee area. The injection was tolerated well.  A band aid dressing was applied.  The patient was advised to apply ice later today and tomorrow to the injection sight as needed.  Return in two weeks.  Continue the Naprosyn.  Call if any problem.  Precautions discussed.   Electronically Signed Darreld Mclean, MD 10/26/20214:00 PM

## 2020-03-12 ENCOUNTER — Other Ambulatory Visit: Payer: PRIVATE HEALTH INSURANCE | Admitting: Adult Health

## 2020-03-19 ENCOUNTER — Ambulatory Visit: Payer: PRIVATE HEALTH INSURANCE | Admitting: Orthopaedic Surgery

## 2020-03-28 ENCOUNTER — Ambulatory Visit (HOSPITAL_COMMUNITY)
Admission: RE | Admit: 2020-03-28 | Discharge: 2020-03-28 | Disposition: A | Discharge status: Home / Self Care | Payer: PRIVATE HEALTH INSURANCE | Source: Ambulatory Visit | Attending: Orthopaedic Surgery | Admitting: Orthopaedic Surgery

## 2020-03-28 ENCOUNTER — Other Ambulatory Visit: Payer: Self-pay

## 2020-03-28 DIAGNOSIS — M25561 Pain in right knee: Secondary | ICD-10-CM | POA: Insufficient documentation

## 2020-04-11 ENCOUNTER — Other Ambulatory Visit: Payer: Self-pay

## 2020-04-11 ENCOUNTER — Ambulatory Visit (INDEPENDENT_AMBULATORY_CARE_PROVIDER_SITE_OTHER): Payer: PRIVATE HEALTH INSURANCE | Admitting: Orthopaedic Surgery

## 2020-04-11 ENCOUNTER — Encounter: Payer: Self-pay | Admitting: Orthopaedic Surgery

## 2020-04-11 VITALS — BP 139/95 | HR 88 | Ht 64.5 in | Wt 248.0 lb

## 2020-04-11 DIAGNOSIS — S83242D Other tear of medial meniscus, current injury, left knee, subsequent encounter: Secondary | ICD-10-CM

## 2020-04-11 DIAGNOSIS — S83241D Other tear of medial meniscus, current injury, right knee, subsequent encounter: Secondary | ICD-10-CM

## 2020-04-11 NOTE — Progress Notes (Signed)
me Patient YW:VPXTG KRISHA BEEGLE, female DOB:10/04/1985, 34 y.o. GYI:[id number]  Chief Complaint  Patient presents with  . Knee Pain    right knee pain,     HPI  RAYANN JOLLEY is a 34 y.o. female who has continued pain of the right knee.  MRI showed: IMPRESSION: 1. Suspect radial tear of the posterior root attachment of the medial meniscus with mild extrusion of the medial meniscal body. 2. Small knee joint effusion and small Baker's cyst. 3. Mild popliteus tendinosis.  I have independently reviewed the MRI.     I have explained the findings to her.  I have recommended arthroscopy.  She will make appointment to see Dr. Romeo Apple or Dr. Dallas Schimke.  Body mass index is 41.91 kg/m.  ROS  Review of Systems  Constitutional: Positive for activity change.  Musculoskeletal: Positive for arthralgias, gait problem and joint swelling.  All other systems reviewed and are negative.   All other systems reviewed and are negative.  The following is a summary of the past history medically, past history surgically, known current medicines, social history and family history.  This information is gathered electronically by the computer from prior information and documentation.  I review this each visit and have found including this information at this point in the chart is beneficial and informative.    Past Medical History:  Diagnosis Date  . Acne 12/27/2014  . Anxiety   . Contraceptive management 10/15/2014  . Fibromyalgia   . Hives of unknown origin 12/27/2014  . HSV-2 seropositive   . Hypertension   . Postpartum examination following vaginal delivery 10/15/2014  . Pregnant 02/16/2014    Past Surgical History:  Procedure Laterality Date  . TONSILLECTOMY      Family History  Problem Relation Age of Onset  . Fibromyalgia Mother   . Sarcoidosis Mother   . Diabetes Paternal Grandmother   . Hypertension Father   . Diabetes Father     Social History Social History   Tobacco Use  .  Smoking status: Never Smoker  . Smokeless tobacco: Never Used  Substance Use Topics  . Alcohol use: No  . Drug use: No    No Known Allergies  Current Outpatient Medications  Medication Sig Dispense Refill  . naproxen (NAPROSYN) 500 MG tablet Take 1 tablet (500 mg total) by mouth 2 (two) times daily. 30 tablet 0  . Prenatal Vit-Fe Fumarate-FA (PNV PRENATAL PLUS MULTIVITAMIN) 27-1 MG TABS Take 1 tablet by mouth daily. 30 tablet 11   No current facility-administered medications for this visit.     Physical Exam  Blood pressure (!) 139/95, pulse 88, height 5' 4.5" (1.638 m), weight 248 lb (112.5 kg).  Constitutional: overall normal hygiene, normal nutrition, well developed, normal grooming, normal body habitus. Assistive device:none  Musculoskeletal: gait and station Limp right, muscle tone and strength are normal, no tremors or atrophy is present.  .  Neurological: coordination overall normal.  Deep tendon reflex/nerve stretch intact.  Sensation normal.  Cranial nerves II-XII intact.   Skin:   Normal overall no scars, lesions, ulcers or rashes. No psoriasis.  Psychiatric: Alert and oriented x 3.  Recent memory intact, remote memory unclear.  Normal mood and affect. Well groomed.  Good eye contact.  Cardiovascular: overall no swelling, no varicosities, no edema bilaterally, normal temperatures of the legs and arms, no clubbing, cyanosis and good capillary refill.  Lymphatic: palpation is normal.  Right knee is tender over medial joint line, slight effusion, no crepitus today, ROM  0 to 110, positive medial McMurray, NV intact.  Limp right.  All other systems reviewed and are negative   The patient has been educated about the nature of the problem(s) and counseled on treatment options.  The patient appeared to understand what I have discussed and is in agreement with it.  Encounter Diagnosis  Name Primary?  . Other tear of medial meniscus, current injury, left knee, subsequent  encounter Yes    PLAN Call if any problems.  Precautions discussed.  Continue current medications.   Return to clinic to see Dr. Romeo Apple or Dr. Dallas Schimke.    Electronically Signed Darreld Mclean, MD 12/2/20212:56 PM

## 2020-04-17 ENCOUNTER — Other Ambulatory Visit: Payer: Self-pay

## 2020-04-17 ENCOUNTER — Ambulatory Visit (INDEPENDENT_AMBULATORY_CARE_PROVIDER_SITE_OTHER): Payer: PRIVATE HEALTH INSURANCE | Admitting: Orthopedic Surgery

## 2020-04-17 ENCOUNTER — Encounter: Payer: Self-pay | Admitting: Orthopedic Surgery

## 2020-04-17 VITALS — BP 134/94 | HR 83 | Ht 64.5 in | Wt 249.0 lb

## 2020-04-17 DIAGNOSIS — Z6841 Body Mass Index (BMI) 40.0 and over, adult: Secondary | ICD-10-CM

## 2020-04-17 DIAGNOSIS — M7121 Synovial cyst of popliteal space [Baker], right knee: Secondary | ICD-10-CM

## 2020-04-17 DIAGNOSIS — M25561 Pain in right knee: Secondary | ICD-10-CM | POA: Diagnosis not present

## 2020-04-17 DIAGNOSIS — S83242D Other tear of medial meniscus, current injury, left knee, subsequent encounter: Secondary | ICD-10-CM

## 2020-04-17 NOTE — Progress Notes (Signed)
New Patient Visit  Assessment: Dominique Garza is a 34 y.o. female with the following: Right knee, possible medial meniscus tear, posterior horn; MRI not conclusive Right knee Bakers cyst   Plan: Dominique Garza presents today for a surgical consultation regarding her right knee pain.  Dr. Hilda Lias ordered an MRI, which was read as a medial meniscus tear, emanating from the posterior root.  Upon my review of the MRI, I am not convinced that there is a significant tear in this area.  In addition, she has diffuse tenderness throughout the knee on my physical exam.  The posterior knee demonstrates a Baker's cyst which is tender to palpation.  In addition, she has some anterior knee pain, as well as medial and lateral joint line tenderness.  I had a lengthy discussion with her, explaining to her that I am not convinced that a knee arthroscopy, with possible partial meniscectomy would improve her symptoms.  In addition, she has yet to participate in physical therapy, and she notes that she has difficulty due to the limited strength in her right thigh.  As a result, I would like to try a period of physical therapy, and a referral was placed today.  If PT does not improve her symptoms overall, and she is interested in pursuing surgery, I think would be a reasonable option after exhausting all other treatment options.  Continue with naproxen as needed.  No work limitations at this time.  The patient meets the AMA guidelines for Morbid obesity with BMI > 40.  The patient has been counseled on weight loss.   Follow-up: No follow-ups on file.  Subjective:  Chief Complaint  Patient presents with  . Knee Pain    right knee pain, patient reports its worse,    History of Present Illness: Dominique Garza is a 34 y.o. female who presents for evaluation of her right knee.  She states she stepped in a hole, approximately 2 months ago and hurt her right knee.  She noted swelling at the time.  She was evaluated at an  urgent care a few days later naproxen.  She has tried using a brace, but this puts too much pressure on her knee.  She states the swelling has improved, and some of her symptoms have improved, however she continues to be limited by her pain.  She previously had a right knee injection, which has not provided sustained relief.  She states her pain is "all over" her knee.   Review of Systems: No fevers or chills No numbness or tingling No chest pain No shortness of breath No bowel or bladder dysfunction No GI distress No headaches   Medical History:  Past Medical History:  Diagnosis Date  . Acne 12/27/2014  . Anxiety   . Contraceptive management 10/15/2014  . Fibromyalgia   . Hives of unknown origin 12/27/2014  . HSV-2 seropositive   . Hypertension   . Postpartum examination following vaginal delivery 10/15/2014  . Pregnant 02/16/2014    Past Surgical History:  Procedure Laterality Date  . TONSILLECTOMY      Family History  Problem Relation Age of Onset  . Fibromyalgia Mother   . Sarcoidosis Mother   . Diabetes Paternal Grandmother   . Hypertension Father   . Diabetes Father    Social History   Tobacco Use  . Smoking status: Never Smoker  . Smokeless tobacco: Never Used  Substance Use Topics  . Alcohol use: No  . Drug use: No  No Known Allergies  Current Meds  Medication Sig  . naproxen (NAPROSYN) 500 MG tablet Take 1 tablet (500 mg total) by mouth 2 (two) times daily.  . Prenatal Vit-Fe Fumarate-FA (PNV PRENATAL PLUS MULTIVITAMIN) 27-1 MG TABS Take 1 tablet by mouth daily.    Objective: BP (!) 134/94   Pulse 83   Ht 5' 4.5" (1.638 m)   Wt 249 lb (112.9 kg)   BMI 42.08 kg/m   Physical Exam:  General: Alert and oriented, no acute distress Gait: Mild, right-sided antalgic gait.  Evaluation of the right knee demonstrates a mild effusion.  She has diffuse tenderness throughout the knee.  She has a fullness in the posterior aspect of the knee, within the  popliteal fossa.  This is tender to palpation.  She has tenderness to palpation along both the medial lateral joint lines.  Negative McMurray's.  Negative Lachman's.  Full range of motion, from full extension to 125 degrees with some discomfort in the posterior aspect of her knee.  No crepitus is appreciated.  No tenderness to patellar grind.    IMAGING: I personally reviewed images previously obtained in clinic   MRI of the left knee demonstrates some maltracking of patella, without full-thickness cartilage damage.  A posterior horn meniscus tear was noted by the radiologist, however upon my review I am not convinced that there is a radial tear as indicated.  I do not appreciate any medial meniscus extrusion and there does not appear to be a meniscus tear on the sagittal view.   New Medications:  No orders of the defined types were placed in this encounter.     Oliver Barre, MD  04/17/2020 10:28 PM

## 2020-04-24 ENCOUNTER — Ambulatory Visit (HOSPITAL_COMMUNITY): Payer: PRIVATE HEALTH INSURANCE | Admitting: Physical Therapy

## 2020-04-24 ENCOUNTER — Encounter (HOSPITAL_COMMUNITY): Payer: Self-pay

## 2020-04-24 ENCOUNTER — Other Ambulatory Visit: Payer: Self-pay

## 2020-05-11 HISTORY — PX: WISDOM TOOTH EXTRACTION: SHX21

## 2020-05-15 ENCOUNTER — Ambulatory Visit (HOSPITAL_COMMUNITY): Payer: PRIVATE HEALTH INSURANCE | Admitting: Physical Therapy

## 2021-01-15 IMAGING — DX DG KNEE COMPLETE 4+V*R*
4 series · 4 of 4 positions shown · non-contrast
Comparison: None.

CLINICAL DATA: Pain following fall

EXAM:
RIGHT KNEE - COMPLETE 4+ VIEW

[knee ap]
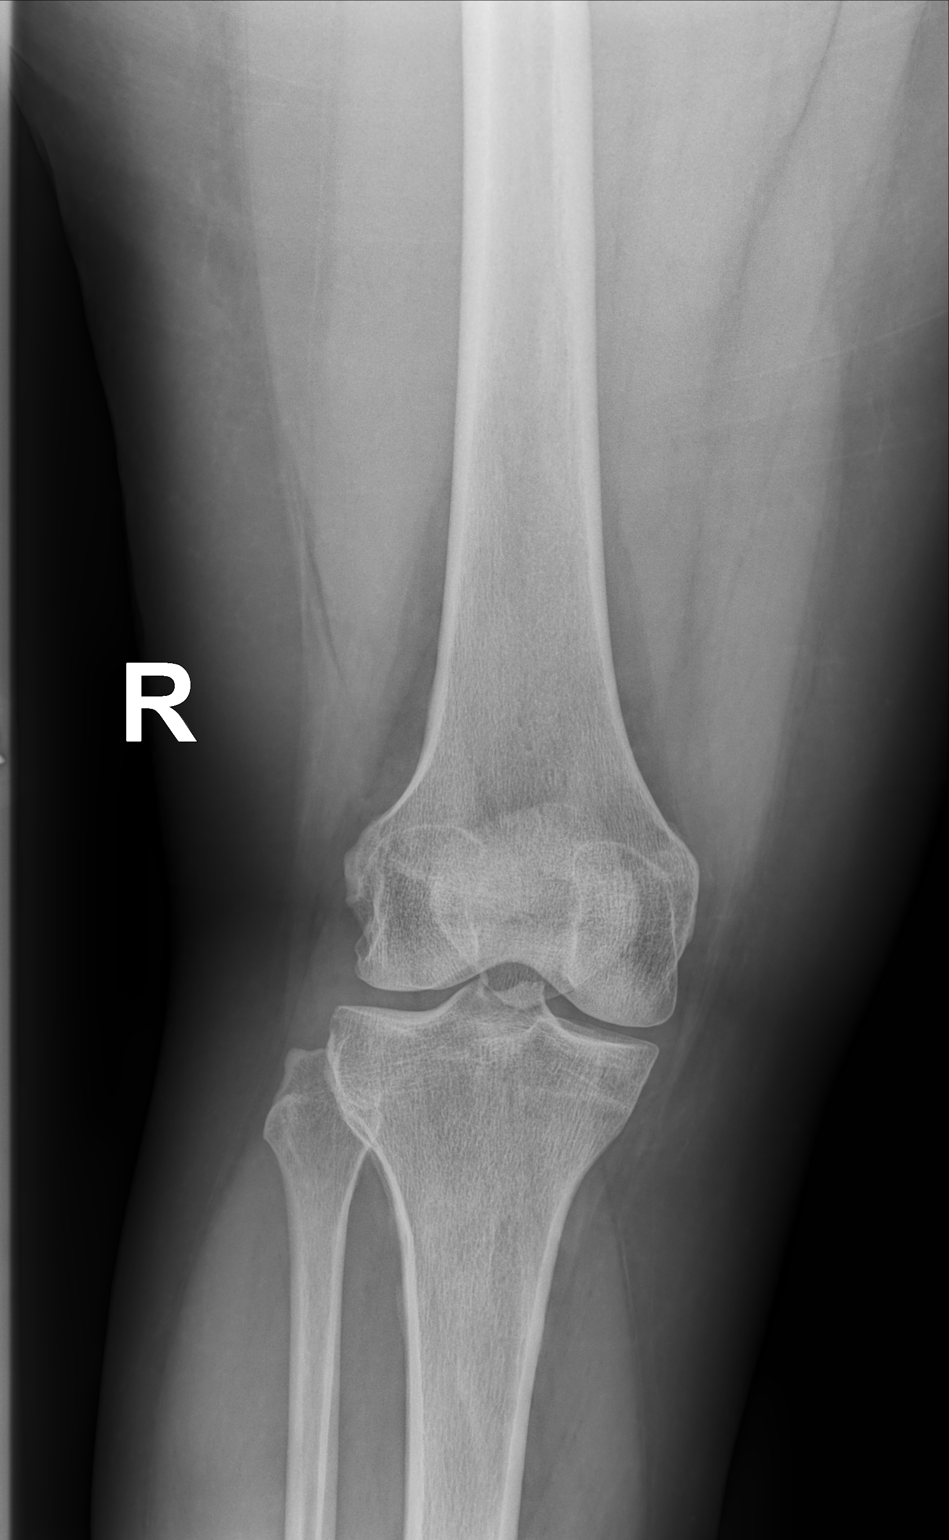

[knee mlo (1 of 2)]
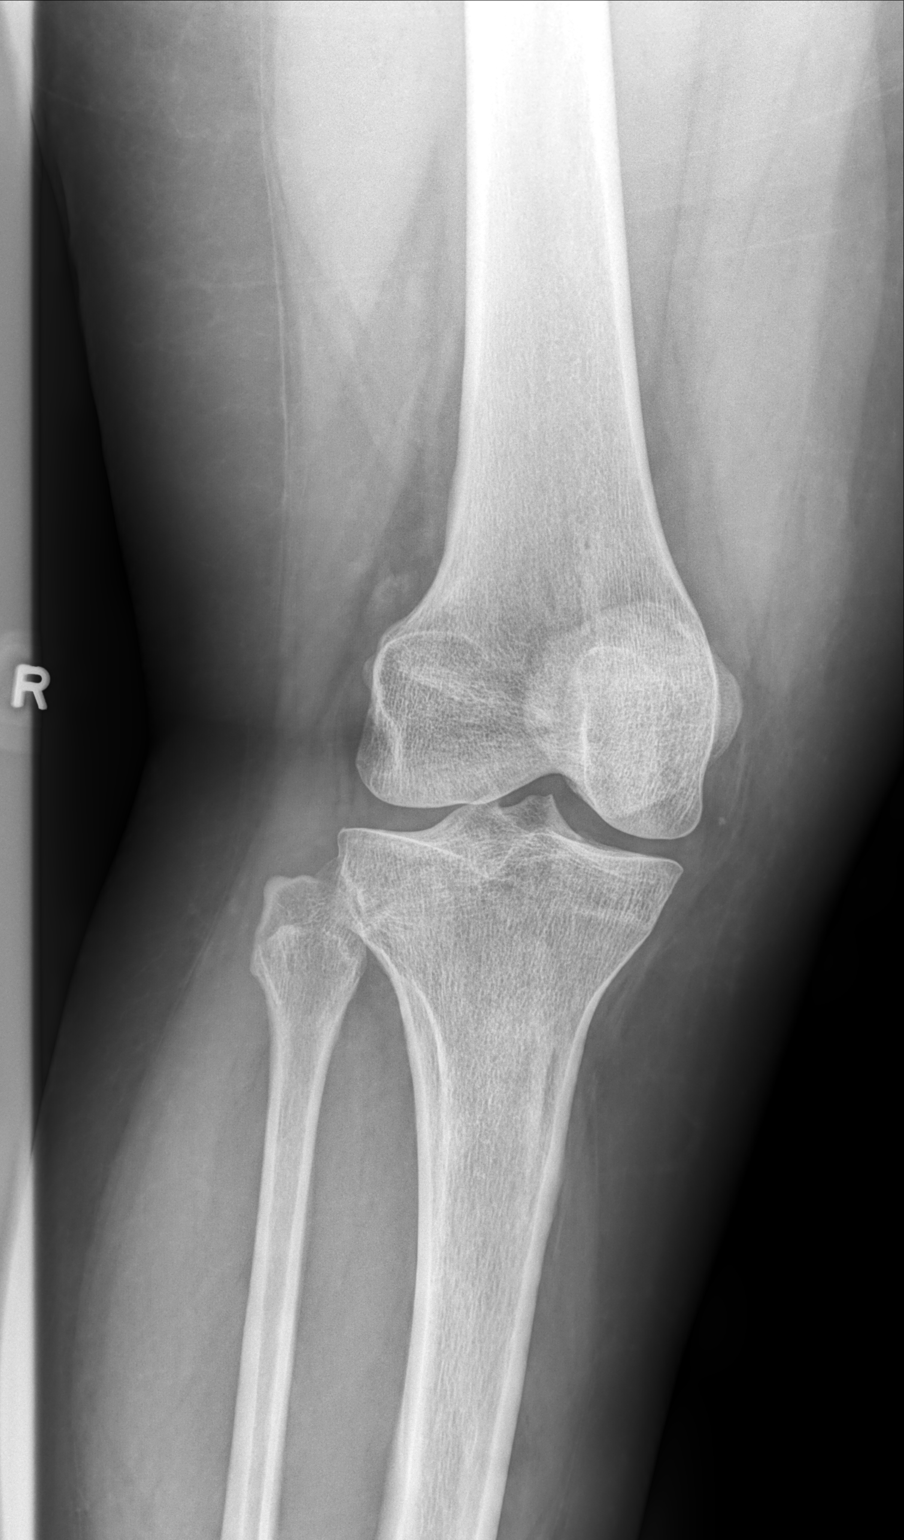

[knee mlo (2 of 2)]
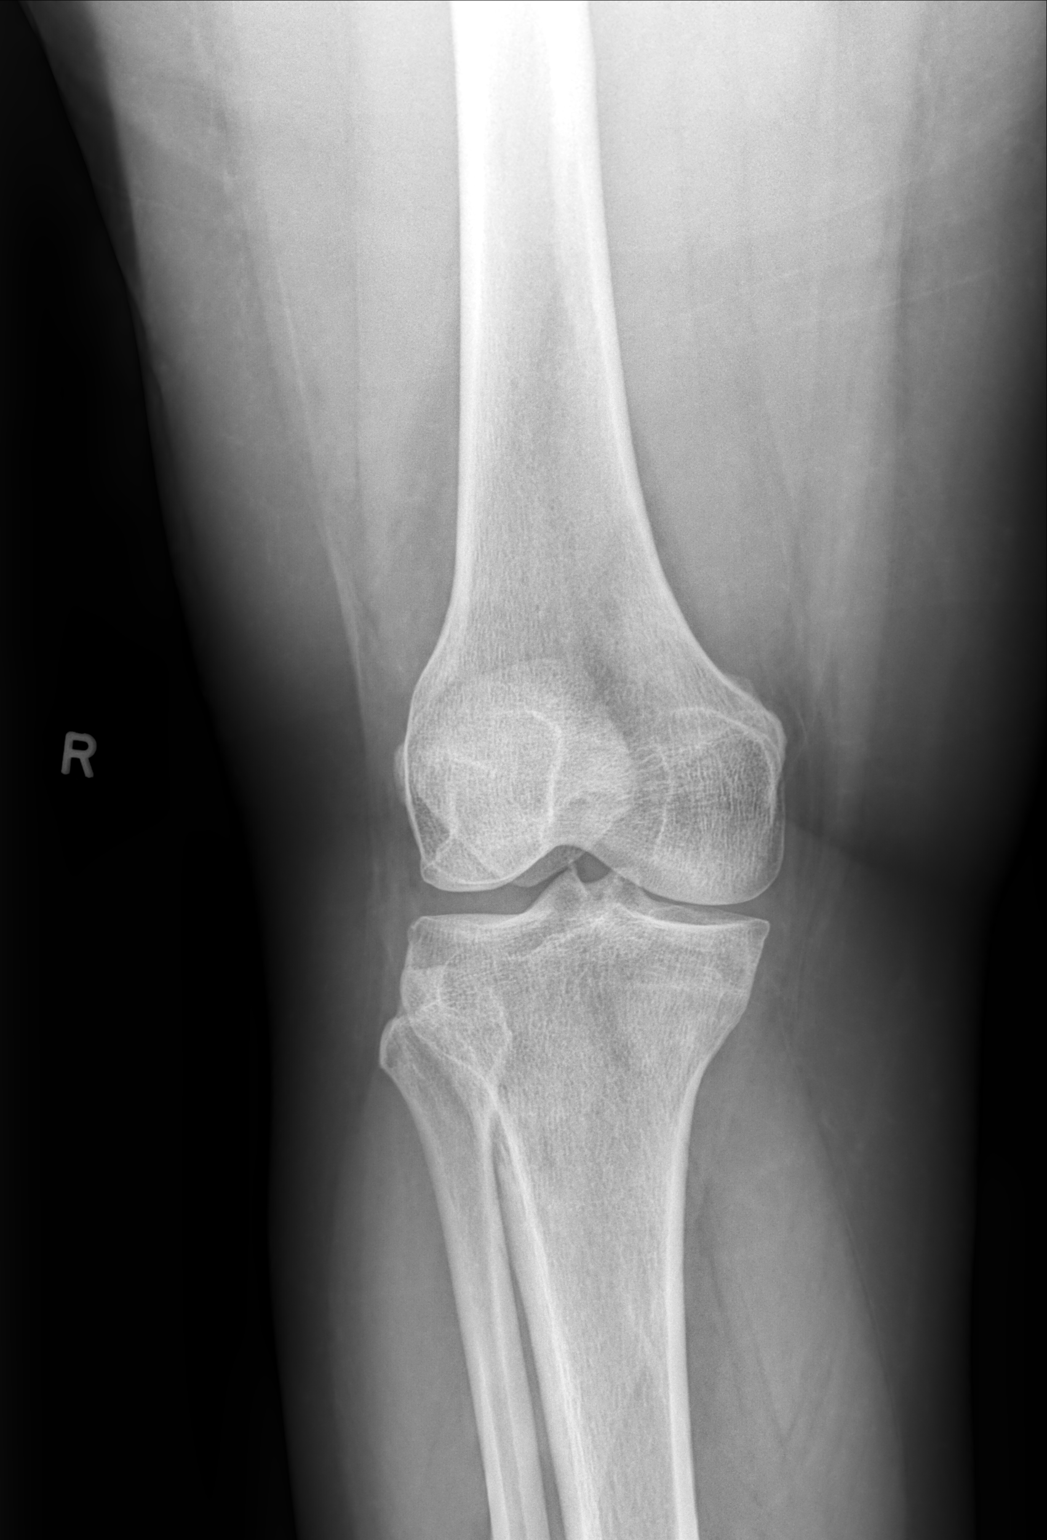

[knee lat]
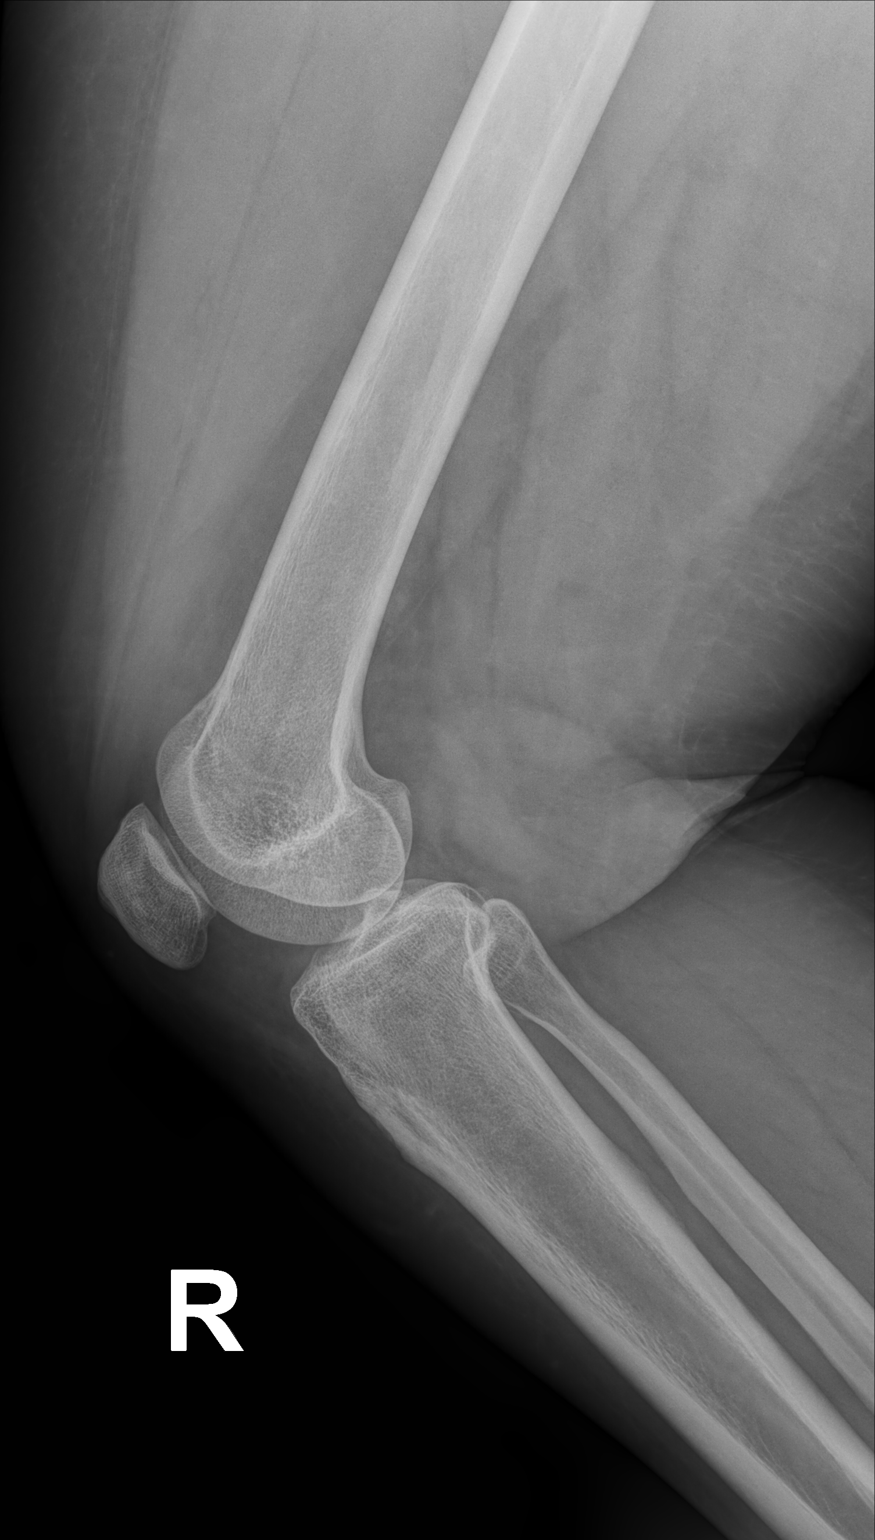

[4 of 4 positions shown; findings below may reference images not displayed]

FINDINGS: Frontal, lateral, and bilateral oblique views were obtained. No
fracture or dislocation. No appreciable joint effusion. There is
mild narrowing of the patellofemoral joint. Other joint spaces
appear normal. No erosive change.
IMPRESSION: Mild narrowing patellofemoral joint. No fracture, dislocation, or
joint effusion.

## 2021-02-24 ENCOUNTER — Other Ambulatory Visit (HOSPITAL_COMMUNITY)
Admission: RE | Admit: 2021-02-24 | Discharge: 2021-02-24 | Disposition: A | Discharge status: Home / Self Care | Payer: PRIVATE HEALTH INSURANCE | Source: Ambulatory Visit | Attending: Women's Health | Admitting: Women's Health

## 2021-02-24 ENCOUNTER — Ambulatory Visit (INDEPENDENT_AMBULATORY_CARE_PROVIDER_SITE_OTHER): Payer: PRIVATE HEALTH INSURANCE | Admitting: Women's Health

## 2021-02-24 ENCOUNTER — Other Ambulatory Visit: Payer: Self-pay

## 2021-02-24 ENCOUNTER — Encounter: Payer: Self-pay | Admitting: Women's Health

## 2021-02-24 VITALS — BP 132/89 | HR 68 | Ht 64.0 in | Wt 251.0 lb

## 2021-02-24 DIAGNOSIS — Z01419 Encounter for gynecological examination (general) (routine) without abnormal findings: Secondary | ICD-10-CM | POA: Insufficient documentation

## 2021-02-24 DIAGNOSIS — N926 Irregular menstruation, unspecified: Secondary | ICD-10-CM | POA: Diagnosis not present

## 2021-02-24 NOTE — Progress Notes (Signed)
GYN VISIT Patient name: Dominique Garza MRN [id number]  Date of birth: Jan 05, 1986 Chief Complaint:   Miscarriage, New Patient (Initial Visit), and Gynecologic Exam  History of Present Illness:   Dominique Garza is a 35 y.o. [phone] African-American female being seen today for possible recent miscarriage.  Periods have been irregular her entire life, usually 1/yr. Had period in Jan, March and June this year, each lasted ~2wks, changes pad q 2-3hr at heaviest, small clots. Then 9/28 had heavy 3d bleeding. Much heavier than her normal, was saturating pads q , had to wear a diaper, large clots the size of her palm, super painful. Took UPT prior to this bleeding, 1st was faint +, 2nd was neg- then bleeding started about 1 wk later. No excess hair growth, no bad acne (just occ blackhead), no acanthosis nigricans. Would be ok if she got pregnant. Has tried provera, birth control, etc in past, nothing has worked to help regulate periods. Has been over 49yrs since she had blood work.  Has 4 kids (13, 11, 10, 6) without the use of fertility meds, wasn't having periods when conceived w/ them.   Patient's last menstrual period was 02/05/2021 (exact date). The current method of family planning is none.  Last pap 11/25/17. Results were: NILM w/ HRHPV negative  Depression screen [facility] 2/9 02/24/2021 11/25/2017  Decreased Interest 0 0  Down, Depressed, Hopeless 0 0  PHQ - 2 Score 0 0  Altered sleeping 0 -  Tired, decreased energy 0 -  Change in appetite 0 -  Feeling bad or failure about yourself  0 -  Trouble concentrating 0 -  Moving slowly or fidgety/restless 0 -  Suicidal thoughts 0 -  PHQ-9 Score 0 -     GAD 7 : Generalized Anxiety Score 02/24/2021  Nervous, Anxious, on Edge 0  Control/stop worrying 0  Worry too much - different things 0  Trouble relaxing 0  Restless 0  Easily annoyed or irritable 0  Afraid - awful might happen 0  Total GAD 7 Score 0     Review of Systems:   Pertinent items  are noted in HPI Denies fever/chills, dizziness, headaches, visual disturbances, fatigue, shortness of breath, chest pain, abdominal pain, vomiting, abnormal vaginal discharge/itching/odor/irritation, problems with periods, bowel movements, urination, or intercourse unless otherwise stated above.  Pertinent History Reviewed:  Reviewed past medical,surgical, social, obstetrical and family history.  Reviewed problem list, medications and allergies. Physical Assessment:   Vitals:   02/24/21 0942  BP: 132/89  Pulse: 68  Weight: 251 lb (113.9 kg)  Height: 5\' 4"  (1.626 m)  Body mass index is 43.08 kg/m.       Physical Examination:   General appearance: alert, well appearing, and in no distress  Mental status: alert, oriented to person, place, and time  Skin: warm & dry   Cardiovascular: normal heart rate noted  Respiratory: normal respiratory effort, no distress  Breasts: normal w/o masses/tenderness/lymph nodes  Abdomen: soft, non-tender   Pelvic: VULVA: normal appearing vulva with no masses, tenderness or lesions, VAGINA: normal appearing vagina with normal color and discharge, no lesions, CERVIX: normal appearing cervix without discharge or lesions  Extremities: no edema   Chaperone: Angel Neas    No results found for this or any previous visit (from the past 24 hour(s)).  Assessment & Plan:  1) Irregular periods> entire life, usually oligomenorrhea, no hyperandrogenic symptoms. Most recent bleeding super heavy/painful, thought it may be miscarriage. Voided w/o collecting urine, states usually  has to have blood work for pregnancy levels. Will check labs and get back w/ pt  2) Cervical cancer screening> pap today  Meds: No orders of the defined types were placed in this encounter.   Orders Placed This Encounter  Procedures   CBC   TSH   Hemoglobin A1c   Beta hCG quant (ref lab)    Return for will notify pt of results.  Cheral Marker CNM, [facility] 02/24/2021 11:27 AM

## 2021-02-25 LAB — TSH: TSH: 2.03 u[IU]/mL (ref 0.450–4.500)

## 2021-02-25 LAB — HEMOGLOBIN A1C
Est. average glucose Bld gHb Est-mCnc: 117 mg/dL
Hgb A1c MFr Bld: 5.7 % — ABNORMAL HIGH (ref 4.8–5.6)

## 2021-02-25 LAB — CBC
Hematocrit: 35.2 % (ref 34.0–46.6)
Hemoglobin: 11.3 g/dL (ref 11.1–15.9)
MCH: 28.1 pg (ref 26.6–33.0)
MCHC: 32.1 g/dL (ref 31.5–35.7)
MCV: 88 fL (ref 79–97)
Platelets: 199 10*3/uL (ref 150–450)
RBC: 4.02 x10E6/uL (ref 3.77–5.28)
RDW: 13.2 % (ref 11.7–15.4)
WBC: 7.2 10*3/uL (ref 3.4–10.8)

## 2021-02-25 LAB — BETA HCG QUANT (REF LAB): hCG Quant: <1 m[IU]/mL

## 2021-02-26 LAB — CYTOLOGY - PAP
Chlamydia: NEGATIVE
Comment: NEGATIVE
Comment: NEGATIVE
Comment: NORMAL
Diagnosis: NEGATIVE
High risk HPV: NEGATIVE
Neisseria Gonorrhea: NEGATIVE

## 2021-03-04 ENCOUNTER — Telehealth: Payer: Self-pay | Admitting: *Deleted

## 2021-03-04 NOTE — Telephone Encounter (Signed)
Pt aware of results. She would like to try infertility meds. Thanks!! JSY

## 2021-03-06 NOTE — Telephone Encounter (Signed)
Pt aware she needs to schedule an appt with MD only to discuss fertility med options. Call transferred to [facility] for appt. JSY

## 2021-03-06 NOTE — Telephone Encounter (Signed)
Left message @ 10:17 am. JSY

## 2021-03-28 ENCOUNTER — Ambulatory Visit (INDEPENDENT_AMBULATORY_CARE_PROVIDER_SITE_OTHER): Payer: PRIVATE HEALTH INSURANCE | Admitting: Obstetrics & Gynecology

## 2021-03-28 ENCOUNTER — Other Ambulatory Visit: Payer: Self-pay

## 2021-03-28 ENCOUNTER — Encounter: Payer: Self-pay | Admitting: Obstetrics & Gynecology

## 2021-03-28 VITALS — BP 131/86 | HR 78 | Ht 64.5 in | Wt 249.0 lb

## 2021-03-28 DIAGNOSIS — N914 Secondary oligomenorrhea: Secondary | ICD-10-CM | POA: Diagnosis not present

## 2021-03-28 MED ORDER — CLOMIPHENE CITRATE 50 MG PO TABS: 50.0000 mg | ORAL_TABLET | Freq: Every day | ORAL | 5 refills | Status: DC

## 2021-03-28 NOTE — Progress Notes (Signed)
Chief Complaint  Patient presents with   Discuss fertility      35 y.o. [id number] Patient's last menstrual period was 03/09/2021 (exact date). The current method of family planning is none.  Outpatient Encounter Medications as of 03/28/2021  Medication Sig   SODIUM FLUORIDE 5000 PPM 1.1 % PSTE PLEASE SEE ATTACHED FOR DETAILED DIRECTIONS   [DISCONTINUED] clomiPHENE (CLOMID) 50 MG tablet Take 1 tablet (50 mg total) by mouth daily. The first 5 days when you start your period   No facility-administered encounter medications on file as of 03/28/2021.    Subjective  Periods have been irregular her entire life, usually 1/yr. Had period in Jan, March and June this year, each lasted ~2wks, changes pad q 2-3hr at heaviest, small clots. Then 9/28 had heavy 3d bleeding. Much heavier than her normal, was saturating pads q , had to wear a diaper, large clots the size of her palm, super painful. Took UPT prior to this bleeding, 1st was faint +, 2nd was neg- then bleeding started about 1 wk later. No excess hair growth, no bad acne (just occ blackhead), no acanthosis nigricans. Would be ok if she got pregnant. Has tried provera, birth control, etc in past, nothing has worked to help regulate periods. Has been over 10yrs since she had blood work.  Past Medical History:  Diagnosis Date   Acne 12/27/2014   Anxiety    Contraceptive management 10/15/2014   Fibromyalgia    Hives of unknown origin 12/27/2014   HSV-2 seropositive    Hypertension    Postpartum examination following vaginal delivery 10/15/2014   Pregnant 02/16/2014    Past Surgical History:  Procedure Laterality Date   TONSILLECTOMY     WISDOM TOOTH EXTRACTION Right 2022    OB History     Gravida  5   Para  4   Term  3   Preterm  1   AB  1   Living  4      SAB  1   IAB      Ectopic      Multiple  0   Live Births  4           No Known Allergies  Social History   Socioeconomic History   Marital  status: Married    Spouse name: Not on file   Number of children: Not on file   Years of education: Not on file   Highest education level: Not on file  Occupational History   Not on file  Tobacco Use   Smoking status: Never   Smokeless tobacco: Never  Vaping Use   Vaping Use: Never used  Substance and Sexual Activity   Alcohol use: No   Drug use: No   Sexual activity: Yes    Birth control/protection: None  Other Topics Concern   Not on file  Social History Narrative   Not on file   Social Determinants of Health   Financial Resource Strain: Low Risk  (02/24/2021)   Overall Financial Resource Strain (CARDIA)    Difficulty of Paying Living Expenses: Not very hard  Food Insecurity: Food Insecurity Present (02/24/2021)   Hunger Vital Sign    Worried About Running Out of Food in the Last Year: Sometimes true    Ran Out of Food in the Last Year: Never true  Transportation Needs: No Transportation Needs (02/24/2021)   PRAPARE - Administrator, Civil Service (Medical): No  Lack of Transportation (Non-Medical): No  Physical Activity: Insufficiently Active (02/24/2021)   Exercise Vital Sign    Days of Exercise per Week: 3 days    Minutes of Exercise per Session: 30 min  Stress: No Stress Concern Present (02/24/2021)   [employer] of Occupational Health - Occupational Stress Questionnaire    Feeling of Stress : Not at all  Social Connections: Moderately Integrated (02/24/2021)   Social Connection and Isolation Panel [NHANES]    Frequency of Communication with Friends and Family: More than three times a week    Frequency of Social Gatherings with Friends and Family: Twice a week    Attends Religious Services: 1 to 4 times per year    Active Member of [employer] or Organizations: No    Attends Engineer, structural: Never    Marital Status: Married    Family History  Problem Relation Age of Onset   Fibromyalgia Mother    Sarcoidosis Mother    Diabetes  Paternal Grandmother    Hypertension Father    Diabetes Father     Medications:       Current Outpatient Medications:    SODIUM FLUORIDE 5000 PPM 1.1 % PSTE, PLEASE SEE ATTACHED FOR DETAILED DIRECTIONS, Disp: , Rfl:    triamcinolone cream (KENALOG) 0.1 %, Apply 1 Application topically 2 (two) times daily., Disp: 30 g, Rfl: 0  Objective Blood pressure 131/86, pulse 78, height 5' 4.5" (1.638 m), weight 249 lb (112.9 kg), last menstrual period 03/09/2021.  Gen [facility] NAD  Pertinent ROS No burning with urination, frequency or urgency No nausea, vomiting or diarrhea Nor fever chills or other constitutional symptoms   Labs or studies Reviewed recent labs all normal( borderline DM range)    Impression Diagnoses this Encounter::   ICD-10-CM   1. Secondary oligomenorrhea, irregular ovulation  N91.4       Established relevant diagnosis(es):   Plan/Recommendations: Meds ordered this encounter  Medications   DISCONTD: clomiPHENE (CLOMID) 50 MG tablet    Sig: Take 1 tablet (50 mg total) by mouth daily. The first 5 days when you start your period    Dispense:  5 tablet    Refill:  5    Labs or Scans Ordered: No orders of the defined types were placed in this encounter.   Management:: Trial of clomid for cycle management ovulation induction  Follow up Return if symptoms worsen or fail to improve.      All questions were answered.

## 2021-08-25 ENCOUNTER — Ambulatory Visit (INDEPENDENT_AMBULATORY_CARE_PROVIDER_SITE_OTHER): Payer: PRIVATE HEALTH INSURANCE | Admitting: Family Medicine

## 2021-08-25 ENCOUNTER — Encounter: Payer: Self-pay | Admitting: Family Medicine

## 2021-08-25 VITALS — BP 127/82 | HR 90 | Ht 64.5 in | Wt 247.8 lb

## 2021-08-25 DIAGNOSIS — M25561 Pain in right knee: Secondary | ICD-10-CM | POA: Diagnosis not present

## 2021-08-25 DIAGNOSIS — L259 Unspecified contact dermatitis, unspecified cause: Secondary | ICD-10-CM | POA: Diagnosis not present

## 2021-08-25 DIAGNOSIS — E559 Vitamin D deficiency, unspecified: Secondary | ICD-10-CM | POA: Diagnosis not present

## 2021-08-25 MED ORDER — TRIAMCINOLONE ACETONIDE 0.1 % EX CREA: 1.0000 "application " | TOPICAL_CREAM | Freq: Two times a day (BID) | CUTANEOUS | 0 refills | Status: DC

## 2021-08-25 NOTE — Assessment & Plan Note (Signed)
-  Topical kenalog cream ordered ?

## 2021-08-25 NOTE — Patient Instructions (Addendum)
I appreciate the opportunity to provide care to you today! ? ?Please pick up your kenalog cream for your rash ?  ?Please return to have your labs drawn when you get the chance this week ? ? ?  ?It was a pleasure to see you and I look forward to continuing to work together on your health and well-being. ?Please do not hesitate to call the office if you need care or have questions about your care. ?  ?Have a wonderful day and week. ?With Gratitude, ?Alvira Monday MSN, FNP-BC ? ?
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
## 2021-08-25 NOTE — Assessment & Plan Note (Signed)
-  Pt's BMI is 41.88 ?-Pain likely due to her weight ?-Encouraged increasing physical activities to lose wt ?

## 2021-10-16 ENCOUNTER — Encounter: Payer: Self-pay | Admitting: Family Medicine

## 2021-11-24 ENCOUNTER — Ambulatory Visit: Payer: Medicaid Other | Admitting: Family Medicine

## 2021-12-01 ENCOUNTER — Encounter: Payer: Self-pay | Admitting: Family Medicine

## 2021-12-01 ENCOUNTER — Ambulatory Visit (INDEPENDENT_AMBULATORY_CARE_PROVIDER_SITE_OTHER): Payer: Self-pay | Admitting: Family Medicine

## 2021-12-01 VITALS — BP 130/88 | HR 84 | Ht 64.5 in | Wt 250.0 lb

## 2021-12-01 DIAGNOSIS — M25561 Pain in right knee: Secondary | ICD-10-CM

## 2021-12-01 DIAGNOSIS — L259 Unspecified contact dermatitis, unspecified cause: Secondary | ICD-10-CM

## 2021-12-01 MED ORDER — TRIAMCINOLONE ACETONIDE 0.1 % EX CREA: 1.0000 | TOPICAL_CREAM | Freq: Two times a day (BID) | CUTANEOUS | 0 refills | Status: DC

## 2021-12-01 NOTE — Assessment & Plan Note (Signed)
Resolved No complaints or concerns

## 2021-12-01 NOTE — Progress Notes (Signed)
Established Patient Office Visit  Subjective:  Patient ID: Dominique Garza, female    DOB: [birth year]  Age: 36 y.o. MRN: [id number]  CC:  Chief Complaint  Patient presents with   Follow-up    3 month    HPI Dominique Garza is a 36 y.o. female with past medical history of right knee pain and eczema presents for f/u of  chronic medical conditions.  Eczema: wants refilled on kenalog cream  Right knee pain: resolved, no complaints or concerns  Past Medical History:  Diagnosis Date   Acne 12/27/2014   Anxiety    Contraceptive management 10/15/2014   Fibromyalgia    Hives of unknown origin 12/27/2014   HSV-2 seropositive    Hypertension    Postpartum examination following vaginal delivery 10/15/2014   Pregnant 02/16/2014    Past Surgical History:  Procedure Laterality Date   TONSILLECTOMY     WISDOM TOOTH EXTRACTION Right 2022    Family History  Problem Relation Age of Onset   Fibromyalgia Mother    Sarcoidosis Mother    Diabetes Paternal Grandmother    Hypertension Father    Diabetes Father     Social History   Socioeconomic History   Marital status: Married    Spouse name: Not on file   Number of children: Not on file   Years of education: Not on file   Highest education level: Not on file  Occupational History   Not on file  Tobacco Use   Smoking status: Never   Smokeless tobacco: Never  Vaping Use   Vaping Use: Never used  Substance and Sexual Activity   Alcohol use: No   Drug use: No   Sexual activity: Yes    Birth control/protection: None  Other Topics Concern   Not on file  Social History Narrative   Not on file   Social Determinants of Health   Financial Resource Strain: Low Risk  (02/24/2021)   Overall Financial Resource Strain (CARDIA)    Difficulty of Paying Living Expenses: Not very hard  Food Insecurity: Food Insecurity Present (02/24/2021)   Hunger Vital Sign    Worried About Running Out of Food in the Last Year: Sometimes true    Ran Out  of Food in the Last Year: Never true  Transportation Needs: No Transportation Needs (02/24/2021)   PRAPARE - Administrator, Civil Service (Medical): No    Lack of Transportation (Non-Medical): No  Physical Activity: Insufficiently Active (02/24/2021)   Exercise Vital Sign    Days of Exercise per Week: 3 days    Minutes of Exercise per Session: 30 min  Stress: No Stress Concern Present (02/24/2021)   [employer] of Occupational Health - Occupational Stress Questionnaire    Feeling of Stress : Not at all  Social Connections: Moderately Integrated (02/24/2021)   Social Connection and Isolation Panel [NHANES]    Frequency of Communication with Friends and Family: More than three times a week    Frequency of Social Gatherings with Friends and Family: Twice a week    Attends Religious Services: 1 to 4 times per year    Active Member of [employer] or Organizations: No    Attends Banker Meetings: Never    Marital Status: Married  Catering manager Violence: Not At Risk (02/24/2021)   Humiliation, Afraid, Rape, and Kick questionnaire    Fear of Current or Ex-Partner: No    Emotionally Abused: No    Physically Abused: No  Sexually Abused: No    Outpatient Medications Prior to Visit  Medication Sig Dispense Refill   SODIUM FLUORIDE 5000 PPM 1.1 % PSTE PLEASE SEE ATTACHED FOR DETAILED DIRECTIONS     triamcinolone cream (KENALOG) 0.1 % Apply 1 application. topically 2 (two) times daily. 30 g 0   clomiPHENE (CLOMID) 50 MG tablet Take 1 tablet (50 mg total) by mouth daily. The first 5 days when you start your period 5 tablet 5   No facility-administered medications prior to visit.    No Known Allergies  ROS Review of Systems  Constitutional:  Negative for fatigue and fever.  Respiratory:  Negative for shortness of breath.   Cardiovascular:  Negative for chest pain and palpitations.  Gastrointestinal:  Negative for nausea and vomiting.  Skin:  Positive for  rash.      Objective:    Physical Exam HENT:     Head: Normocephalic.  Cardiovascular:     Rate and Rhythm: Normal rate.     Pulses: Normal pulses.     Heart sounds: Normal heart sounds.  Pulmonary:     Effort: Pulmonary effort is normal.     Breath sounds: Normal breath sounds.  Skin:    Findings: Rash (left knee and neck line) present.  Neurological:     Mental Status: She is alert.     BP 130/88   Pulse 84   Ht 5' 4.5" (1.638 m)   Wt 250 lb (113.4 kg)   SpO2 97%   BMI 42.25 kg/m  Wt Readings from Last 3 Encounters:  12/01/21 250 lb (113.4 kg)  08/25/21 247 lb 12.8 oz (112.4 kg)  03/28/21 249 lb (112.9 kg)    Lab Results  Component Value Date   TSH 2.030 02/24/2021   Lab Results  Component Value Date   WBC 7.2 02/24/2021   HGB 11.3 02/24/2021   HCT 35.2 02/24/2021   MCV 88 02/24/2021   PLT 199 02/24/2021   Lab Results  Component Value Date   NA 135 11/23/2017   K 3.8 11/23/2017   CO2 22 11/23/2017   GLUCOSE 101 (H) 11/23/2017   BUN 15 11/23/2017   CREATININE 0.94 11/23/2017   BILITOT 0.7 12/01/2015   ALKPHOS 111 12/01/2015   AST 77 (H) 12/01/2015   ALT 77 (H) 12/01/2015   PROT 8.1 12/01/2015   ALBUMIN 4.2 12/01/2015   CALCIUM 9.2 11/23/2017   ANIONGAP 9 11/23/2017   No results found for: "CHOL" No results found for: "HDL" No results found for: "LDLCALC" No results found for: "TRIG" No results found for: "CHOLHDL" Lab Results  Component Value Date   HGBA1C 5.7 (H) 02/24/2021      Assessment & Plan:   Problem List Items Addressed This Visit       Musculoskeletal and Integument   Contact dermatitis and eczema - Primary    Refilled kenalog cream      Relevant Medications   triamcinolone cream (KENALOG) 0.1 %     Other   Right knee pain    Resolved No complaints or concerns       Meds ordered this encounter  Medications   triamcinolone cream (KENALOG) 0.1 %    Sig: Apply 1 Application topically 2 (two) times daily.     Dispense:  30 g    Refill:  0    Follow-up: Return in about 4 months (around 04/03/2022).    Gilmore Laroche, FNP

## 2021-12-01 NOTE — Assessment & Plan Note (Signed)
Refilled kenalog cream ?

## 2021-12-01 NOTE — Patient Instructions (Signed)
I appreciate the opportunity to provide care to you today!    Follow up:  4 months CPE  Labs: please stop by the lab when you can to get your blood drawn (CBC, CMP, TSH, Lipid profile, HgA1c, Vit D)  Screening: HIV and Hep C  Please stop by your local pharmacy and get your Tdap and Shingles vaccine     Please continue to a heart-healthy diet and increase your physical activities. Try to exercise for at least three times a week.      It was a pleasure to see you and I look forward to continuing to work together on your health and well-being. Please do not hesitate to call the office if you need care or have questions about your care.   Have a wonderful day and week. With Gratitude, Gilmore Laroche MSN, FNP-BC

## 2022-02-03 ENCOUNTER — Encounter: Payer: Self-pay | Admitting: Obstetrics & Gynecology

## 2022-04-09 ENCOUNTER — Encounter: Payer: Self-pay | Admitting: Family Medicine

## 2022-04-09 ENCOUNTER — Encounter: Payer: Medicaid Other | Admitting: Family Medicine

## 2022-07-09 ENCOUNTER — Encounter: Payer: Self-pay | Admitting: Radiology

## 2023-02-06 ENCOUNTER — Emergency Department (HOSPITAL_COMMUNITY)
Admission: EM | Admit: 2023-02-06 | Discharge: 2023-02-06 | Discharge status: Against Medical Advice | Payer: Medicaid Other | Attending: Emergency Medicine | Admitting: Emergency Medicine

## 2023-02-06 ENCOUNTER — Other Ambulatory Visit: Payer: Self-pay

## 2023-02-06 ENCOUNTER — Encounter (HOSPITAL_COMMUNITY): Payer: Self-pay | Admitting: *Deleted

## 2023-02-06 DIAGNOSIS — R112 Nausea with vomiting, unspecified: Secondary | ICD-10-CM | POA: Insufficient documentation

## 2023-02-06 DIAGNOSIS — R197 Diarrhea, unspecified: Secondary | ICD-10-CM | POA: Insufficient documentation

## 2023-02-06 DIAGNOSIS — Z5321 Procedure and treatment not carried out due to patient leaving prior to being seen by health care provider: Secondary | ICD-10-CM | POA: Insufficient documentation

## 2023-02-06 DIAGNOSIS — R109 Unspecified abdominal pain: Secondary | ICD-10-CM | POA: Diagnosis not present

## 2023-02-06 LAB — COMPREHENSIVE METABOLIC PANEL
ALT: 24 U/L (ref 0–44)
AST: 20 U/L (ref 15–41)
Albumin: 4 g/dL (ref 3.5–5.0)
Alkaline Phosphatase: 79 U/L (ref 38–126)
Anion gap: 8 (ref 5–15)
BUN: 10 mg/dL (ref 6–20)
CO2: 23 mmol/L (ref 22–32)
Calcium: 8.8 mg/dL — ABNORMAL LOW (ref 8.9–10.3)
Chloride: 104 mmol/L (ref 98–111)
Creatinine, Ser: 0.81 mg/dL (ref 0.44–1.00)
GFR, Estimated: >60 mL/min (ref >60)
Glucose, Bld: 90 mg/dL (ref 70–99)
Potassium: 3.6 mmol/L (ref 3.5–5.1)
Sodium: 135 mmol/L (ref 135–145)
Total Bilirubin: 0.5 mg/dL (ref 0.3–1.2)
Total Protein: 7.6 g/dL (ref 6.5–8.1)

## 2023-02-06 LAB — URINALYSIS, ROUTINE W REFLEX MICROSCOPIC
Bilirubin Urine: NEGATIVE
Glucose, UA: NEGATIVE mg/dL
Ketones, ur: NEGATIVE mg/dL
Nitrite: NEGATIVE
Protein, ur: NEGATIVE mg/dL
Specific Gravity, Urine: 1.026 (ref 1.005–1.030)
pH: 5 (ref 5.0–8.0)

## 2023-02-06 LAB — CBC
HCT: 38.9 % (ref 36.0–46.0)
Hemoglobin: 12.6 g/dL (ref 12.0–15.0)
MCH: 29.5 pg (ref 26.0–34.0)
MCHC: 32.4 g/dL (ref 30.0–36.0)
MCV: 91.1 fL (ref 80.0–100.0)
Platelets: 173 10*3/uL (ref 150–400)
RBC: 4.27 MIL/uL (ref 3.87–5.11)
RDW: 14.1 % (ref 11.5–15.5)
WBC: 11.9 10*3/uL — ABNORMAL HIGH (ref 4.0–10.5)
nRBC: 0 % (ref 0.0–0.2)

## 2023-02-06 LAB — PREGNANCY, URINE: Preg Test, Ur: NEGATIVE

## 2023-02-06 LAB — LIPASE, BLOOD: Lipase: 26 U/L (ref 11–51)

## 2023-02-06 NOTE — ED Triage Notes (Signed)
Pt with right sided abd pain with N/V/D since yesterday.

## 2023-02-07 ENCOUNTER — Emergency Department (HOSPITAL_COMMUNITY)
Admission: EM | Admit: 2023-02-07 | Discharge: 2023-02-07 | Disposition: A | Discharge status: Home / Self Care | Payer: Medicaid Other | Attending: Emergency Medicine | Admitting: Emergency Medicine

## 2023-02-07 ENCOUNTER — Other Ambulatory Visit: Payer: Self-pay

## 2023-02-07 ENCOUNTER — Emergency Department (HOSPITAL_COMMUNITY): Payer: Medicaid Other

## 2023-02-07 ENCOUNTER — Encounter (HOSPITAL_COMMUNITY): Payer: Self-pay | Admitting: Emergency Medicine

## 2023-02-07 DIAGNOSIS — D1803 Hemangioma of intra-abdominal structures: Secondary | ICD-10-CM | POA: Diagnosis not present

## 2023-02-07 DIAGNOSIS — R109 Unspecified abdominal pain: Secondary | ICD-10-CM

## 2023-02-07 DIAGNOSIS — I10 Essential (primary) hypertension: Secondary | ICD-10-CM | POA: Diagnosis not present

## 2023-02-07 DIAGNOSIS — Z79899 Other long term (current) drug therapy: Secondary | ICD-10-CM | POA: Diagnosis not present

## 2023-02-07 DIAGNOSIS — N3090 Cystitis, unspecified without hematuria: Secondary | ICD-10-CM

## 2023-02-07 DIAGNOSIS — R1033 Periumbilical pain: Secondary | ICD-10-CM | POA: Diagnosis present

## 2023-02-07 DIAGNOSIS — R1031 Right lower quadrant pain: Secondary | ICD-10-CM | POA: Diagnosis not present

## 2023-02-07 LAB — URINALYSIS, ROUTINE W REFLEX MICROSCOPIC
Bilirubin Urine: NEGATIVE
Glucose, UA: NEGATIVE mg/dL
Ketones, ur: NEGATIVE mg/dL
Nitrite: POSITIVE — AB
Protein, ur: 30 mg/dL — AB
Specific Gravity, Urine: 1.026 (ref 1.005–1.030)
pH: 5 (ref 5.0–8.0)

## 2023-02-07 LAB — CBC WITH DIFFERENTIAL/PLATELET
Abs Immature Granulocytes: 0.03 10*3/uL (ref 0.00–0.07)
Basophils Absolute: 0 10*3/uL (ref 0.0–0.1)
Basophils Relative: 0 %
Eosinophils Absolute: 0.1 10*3/uL (ref 0.0–0.5)
Eosinophils Relative: 1 %
HCT: 36.3 % (ref 36.0–46.0)
Hemoglobin: 12.1 g/dL (ref 12.0–15.0)
Immature Granulocytes: 0 %
Lymphocytes Relative: 24 %
Lymphs Abs: 2.2 10*3/uL (ref 0.7–4.0)
MCH: 29.8 pg (ref 26.0–34.0)
MCHC: 33.3 g/dL (ref 30.0–36.0)
MCV: 89.4 fL (ref 80.0–100.0)
Monocytes Absolute: 0.5 10*3/uL (ref 0.1–1.0)
Monocytes Relative: 5 %
Neutro Abs: 6.5 10*3/uL (ref 1.7–7.7)
Neutrophils Relative %: 70 %
Platelets: 176 10*3/uL (ref 150–400)
RBC: 4.06 MIL/uL (ref 3.87–5.11)
RDW: 14.1 % (ref 11.5–15.5)
WBC: 9.4 10*3/uL (ref 4.0–10.5)
nRBC: 0 % (ref 0.0–0.2)

## 2023-02-07 LAB — COMPREHENSIVE METABOLIC PANEL
ALT: 25 U/L (ref 0–44)
AST: 22 U/L (ref 15–41)
Albumin: 3.7 g/dL (ref 3.5–5.0)
Alkaline Phosphatase: 76 U/L (ref 38–126)
Anion gap: 7 (ref 5–15)
BUN: 10 mg/dL (ref 6–20)
CO2: 24 mmol/L (ref 22–32)
Calcium: 8.4 mg/dL — ABNORMAL LOW (ref 8.9–10.3)
Chloride: 107 mmol/L (ref 98–111)
Creatinine, Ser: 0.85 mg/dL (ref 0.44–1.00)
GFR, Estimated: >60 mL/min (ref >60)
Glucose, Bld: 107 mg/dL — ABNORMAL HIGH (ref 70–99)
Potassium: 3.6 mmol/L (ref 3.5–5.1)
Sodium: 138 mmol/L (ref 135–145)
Total Bilirubin: 0.4 mg/dL (ref 0.3–1.2)
Total Protein: 6.8 g/dL (ref 6.5–8.1)

## 2023-02-07 LAB — LIPASE, BLOOD: Lipase: 27 U/L (ref 11–51)

## 2023-02-07 LAB — PREGNANCY, URINE: Preg Test, Ur: NEGATIVE

## 2023-02-07 MED ORDER — CEFADROXIL 500 MG PO CAPS: 500.0000 mg | ORAL_CAPSULE | Freq: Two times a day (BID) | ORAL | 0 refills | Status: DC

## 2023-02-07 MED ORDER — SODIUM CHLORIDE 0.9 % IV SOLN
1.0000 g | Freq: Once | INTRAVENOUS | Status: AC
  Administered 2023-02-07: 1 g via INTRAVENOUS
  Filled 2023-02-07: qty 10

## 2023-02-07 MED ORDER — IBUPROFEN 600 MG PO TABS: 600.0000 mg | ORAL_TABLET | Freq: Four times a day (QID) | ORAL | 0 refills | Status: DC | PRN

## 2023-02-07 MED ORDER — PHENAZOPYRIDINE HCL 200 MG PO TABS: 200.0000 mg | ORAL_TABLET | Freq: Three times a day (TID) | ORAL | 0 refills | Status: DC | PRN

## 2023-02-07 MED ORDER — HYDROCODONE-ACETAMINOPHEN 5-325 MG PO TABS
1.0000 | ORAL_TABLET | Freq: Once | ORAL | Status: AC
  Administered 2023-02-07: 1 via ORAL
  Filled 2023-02-07: qty 1

## 2023-02-07 MED ORDER — IOHEXOL 300 MG/ML  SOLN
100.0000 mL | Freq: Once | INTRAMUSCULAR | Status: AC | PRN
  Administered 2023-02-07: 100 mL via INTRAVENOUS
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
## 2023-02-07 NOTE — ED Provider Triage Note (Signed)
Emergency Medicine Provider Triage Evaluation Note  Dominique Garza , a 37 y.o. female  was evaluated in triage.  Pt complains of abdominal pain.  Patient began with periumbilical abdominal pain 2 days ago when she was driving.  Patient states that since then, pain has migrated to right lower abdomen.  Reports nausea no emesis.  Denies any fever, chills, chest pain, shortness of breath.  Has reported diarrhea since symptom onset but denies any hematochezia/melena.  Denies history of abdominal surgeries.  Review of Systems  Positive: See above Negative:   Physical Exam  BP 116/78 (BP Location: Right Arm)   Pulse 82   Temp 98 F (36.7 C) (Oral)   Resp 19   SpO2 100%  Gen:   Awake, no distress   Resp:  Normal effort  MSK:   Moves extremities without difficulty  Other:  Abdominal tenderness with guarding  Medical Decision Making  Medically screening exam initiated at 11:51 AM.  Appropriate orders placed.  Dominique Garza was informed that the remainder of the evaluation will be completed by another provider, this initial triage assessment does not replace that evaluation, and the importance of remaining in the ED until their evaluation is complete.     Peter Garter, [place] 02/07/23 1152

## 2023-02-07 NOTE — ED Notes (Signed)
Patient transported to CT 

## 2023-02-07 NOTE — ED Triage Notes (Signed)
Pt c/o rlq pain with n/d x 2 days. Here last night but left due to long wait per pt. Denies vomiting. C/o diarrhea every time she eats. C/o urinating more than usual. Mm wet. Denies vag d/c

## 2023-02-07 NOTE — Discharge Instructions (Signed)
As discussed, workup today overall reassuring.  CT study did not show any abnormality.  Urine did look pretty significantly infected.  Suspect this is where your lower abdominal pain is coming from.  Will recommend continues of antibiotics in the outpatient setting.  We have given your first dose here while emergency department.  We have also obtained a urine culture and we will call you if the antibiotic does not align with the bacteria grown in your urine.  Please do not hesitate to return to emergency department for worrisome signs and symptoms we discussed become apparent.

## 2023-02-09 ENCOUNTER — Encounter: Payer: Self-pay | Admitting: Family Medicine

## 2023-02-11 LAB — URINE CULTURE: Culture: >=100000 — AB

## 2023-02-12 NOTE — Telephone Encounter (Signed)
Post ED Visit - Positive Culture Follow-up  Culture report reviewed by antimicrobial stewardship pharmacist: [facility] []  Enzo Bi, Pharm.D. []  Celedonio Miyamoto, Pharm.D., BCPS AQ-ID []  Garvin Fila, Pharm.D., BCPS []  Georgina Pillion, Pharm.D., BCPS []  [place], [address].D., BCPS, AAHIVP []  Estella Husk, Pharm.D., BCPS, AAHIVP [x]  Newman Pies, PharmD, BCPS []  Phillips Climes, PharmD, BCPS []  Agapito Games, PharmD, BCPS []  Verlan Friends, PharmD []  Mervyn Gay, PharmD, BCPS []  Vinnie Level, PharmD  [facility] []  Len Childs, PharmD []  Greer Pickerel, PharmD []  Adalberto Cole, PharmD []  Perlie Gold, Rph []  Lonell Face) Jean Rosenthal, PharmD []  Earl Many, PharmD []  Junita Push, PharmD []  Dorna Leitz, PharmD []  Terrilee Files, PharmD []  Lynann Beaver, PharmD []  Keturah Barre, PharmD []  Loralee Pacas, PharmD []  Bernadene Person, PharmD   Positive urine culture Treated with Cefadroxil, organism sensitive to the same and no further patient follow-up is required at this time.  Bing Quarry 02/12/2023, 8:30 AM

## 2023-02-19 ENCOUNTER — Ambulatory Visit: Payer: Medicaid Other | Admitting: Family Medicine

## 2023-02-19 VITALS — BP 126/84 | HR 75 | Ht 64.5 in | Wt 239.1 lb

## 2023-02-19 DIAGNOSIS — E559 Vitamin D deficiency, unspecified: Secondary | ICD-10-CM | POA: Diagnosis not present

## 2023-02-19 DIAGNOSIS — R7301 Impaired fasting glucose: Secondary | ICD-10-CM | POA: Diagnosis not present

## 2023-02-19 DIAGNOSIS — E7849 Other hyperlipidemia: Secondary | ICD-10-CM

## 2023-02-19 DIAGNOSIS — R1084 Generalized abdominal pain: Secondary | ICD-10-CM

## 2023-02-19 DIAGNOSIS — E038 Other specified hypothyroidism: Secondary | ICD-10-CM

## 2023-02-19 DIAGNOSIS — R109 Unspecified abdominal pain: Secondary | ICD-10-CM | POA: Insufficient documentation

## 2023-02-19 NOTE — Progress Notes (Deleted)
Acute Office Visit  Subjective:    Patient ID: Dominique Garza, female    DOB: 20-Dec-1985, 37 y.o.   MRN: [id number]  Chief Complaint  Patient presents with   Abdominal Pain    HPI Patient is in today for ***  Past Medical History:  Diagnosis Date   Acne 12/27/2014   Anxiety    Contraceptive management 10/15/2014   Fibromyalgia    Hives of unknown origin 12/27/2014   HSV-2 seropositive    Hypertension    Postpartum examination following vaginal delivery 10/15/2014   Pregnant 02/16/2014    Past Surgical History:  Procedure Laterality Date   TONSILLECTOMY     WISDOM TOOTH EXTRACTION Right 2022    Family History  Problem Relation Age of Onset   Fibromyalgia Mother    Sarcoidosis Mother    Diabetes Paternal Grandmother    Hypertension Father    Diabetes Father     Social History   Socioeconomic History   Marital status: Married    Spouse name: Not on file   Number of children: Not on file   Years of education: Not on file   Highest education level: 12th grade  Occupational History   Not on file  Tobacco Use   Smoking status: Never   Smokeless tobacco: Never  Vaping Use   Vaping status: Never Used  Substance and Sexual Activity   Alcohol use: No   Drug use: No   Sexual activity: Yes    Birth control/protection: None  Other Topics Concern   Not on file  Social History Narrative   Not on file   Social Determinants of Health   Financial Resource Strain: Low Risk  (02/19/2023)   Overall Financial Resource Strain (CARDIA)    Difficulty of Paying Living Expenses: Not very hard  Food Insecurity: No Food Insecurity (02/19/2023)   Hunger Vital Sign    Worried About Running Out of Food in the Last Year: Never true    Ran Out of Food in the Last Year: Never true  Transportation Needs: No Transportation Needs (02/19/2023)   PRAPARE - Administrator, Civil Service (Medical): No    Lack of Transportation (Non-Medical): No  Physical Activity:  Sufficiently Active (02/19/2023)   Exercise Vital Sign    Days of Exercise per Week: 7 days    Minutes of Exercise per Session: 40 min  Stress: No Stress Concern Present (02/19/2023)   [employer] of Occupational Health - Occupational Stress Questionnaire    Feeling of Stress : Not at all  Social Connections: Moderately Integrated (02/19/2023)   Social Connection and Isolation Panel [NHANES]    Frequency of Communication with Friends and Family: More than three times a week    Frequency of Social Gatherings with Friends and Family: More than three times a week    Attends Religious Services: More than 4 times per year    Active Member of [employer] or Organizations: No    Attends Banker Meetings: Not on file    Marital Status: Married  Catering manager Violence: Not At Risk (02/24/2021)   Humiliation, Afraid, Rape, and Kick questionnaire    Fear of Current or Ex-Partner: No    Emotionally Abused: No    Physically Abused: No    Sexually Abused: No    Outpatient Medications Prior to Visit  Medication Sig Dispense Refill   ibuprofen (ADVIL) 600 MG tablet Take 1 tablet (600 mg total) by mouth every 6 (six) hours as  needed. 30 tablet 0   triamcinolone cream (KENALOG) 0.1 % Apply 1 Application topically 2 (two) times daily. 30 g 0   cefadroxil (DURICEF) 500 MG capsule Take 1 capsule (500 mg total) by mouth 2 (two) times daily. (Patient not taking: Reported on 02/19/2023) 10 capsule 0   phenazopyridine (PYRIDIUM) 200 MG tablet Take 1 tablet (200 mg total) by mouth 3 (three) times daily as needed for pain. (Patient not taking: Reported on 02/19/2023) 6 tablet 0   SODIUM FLUORIDE 5000 PPM 1.1 % PSTE PLEASE SEE ATTACHED FOR DETAILED DIRECTIONS (Patient not taking: Reported on 02/19/2023)     No facility-administered medications prior to visit.    No Known Allergies  Review of Systems     Objective:    Physical Exam  BP 126/84 (BP Location: Right Arm, Patient Position:  Sitting, Cuff Size: Normal)   Pulse 75   Ht 5' 4.5" (1.638 m)   Wt 239 lb 1.9 oz (108.5 kg)   SpO2 93%   BMI 40.41 kg/m  Wt Readings from Last 3 Encounters:  02/19/23 239 lb 1.9 oz (108.5 kg)  02/06/23 236 lb (107 kg)  12/01/21 250 lb (113.4 kg)       Assessment & Plan:  There are no diagnoses linked to this encounter.  Gilmore Laroche, FNP

## 2023-02-19 NOTE — Assessment & Plan Note (Signed)
Reviewed preventive measures of UTI To help prevent future urinary tract infections (UTIs), consider the following practices:  Avoid Prolonged Urination: Do not hold urine for extended periods, as this can stretch the bladder and create an environment conducive to bacterial growth. Prompt Bladder Emptying: Empty your bladder as soon as you feel the urge. Post-Intercourse Hygiene: Empty your bladder soon after intercourse to reduce the risk of infection. Shower Instead of Bath: Opt for showers rather than baths to minimize bacterial exposure. Proper Wiping Technique: Wipe from front to back after urinating and bowel movements to prevent the transfer of bacteria from the anal region to the vagina and urethra. Hydration: Drink a full glass of water regularly to help flush bacteria from your system.

## 2023-02-19 NOTE — Patient Instructions (Signed)
I appreciate the opportunity to provide care to you today!    Follow up:  6 months  Labs: please stop by the lab during the week to get your blood drawn (CBC, CMP, TSH, Lipid profile, HgA1c, Vit D)  Please continue to a heart-healthy diet and increase your physical activities. Try to exercise for at least five days a week.    It was a pleasure to see you and I look forward to continuing to work together on your health and well-being. Please do not hesitate to call the office if you need care or have questions about your care.  In case of emergency, please visit the Emergency Department for urgent care, or contact our clinic at [phone] to schedule an appointment. We're here to help you!   Have a wonderful day and week. With Gratitude, Gilmore Laroche MSN, FNP-BC

## 2023-02-19 NOTE — Progress Notes (Signed)
Established Patient Office Visit  Subjective:  Patient ID: Dominique Garza, female    DOB: 1985-09-28  Age: 37 y.o. MRN: [id number]  CC:  Chief Complaint  Patient presents with   Abdominal Pain    HPI Dominique Garza is a 37 y.o. female presents for abdominal pain follow-up.  Abdominal Pain: The patient reports that her abdominal pain has resolved. She was seen in the ED on 02/07/2023 and treated for a urinary tract infection. After completing the full course of antibiotics, she denies experiencing any symptoms of urgency, frequency, or pain with urination. Additionally, she reports no fever or chills.    Past Medical History:  Diagnosis Date   Acne 12/27/2014   Anxiety    Contraceptive management 10/15/2014   Fibromyalgia    Hives of unknown origin 12/27/2014   HSV-2 seropositive    Hypertension    Postpartum examination following vaginal delivery 10/15/2014   Pregnant 02/16/2014    Past Surgical History:  Procedure Laterality Date   TONSILLECTOMY     WISDOM TOOTH EXTRACTION Right 2022    Family History  Problem Relation Age of Onset   Fibromyalgia Mother    Sarcoidosis Mother    Diabetes Paternal Grandmother    Hypertension Father    Diabetes Father     Social History   Socioeconomic History   Marital status: Married    Spouse name: Not on file   Number of children: Not on file   Years of education: Not on file   Highest education level: 12th grade  Occupational History   Not on file  Tobacco Use   Smoking status: Never   Smokeless tobacco: Never  Vaping Use   Vaping status: Never Used  Substance and Sexual Activity   Alcohol use: No   Drug use: No   Sexual activity: Yes    Birth control/protection: None  Other Topics Concern   Not on file  Social History Narrative   Not on file   Social Determinants of Health   Financial Resource Strain: Low Risk  (02/19/2023)   Overall Financial Resource Strain (CARDIA)    Difficulty of Paying Living Expenses: Not  very hard  Food Insecurity: No Food Insecurity (02/19/2023)   Hunger Vital Sign    Worried About Running Out of Food in the Last Year: Never true    Ran Out of Food in the Last Year: Never true  Transportation Needs: No Transportation Needs (02/19/2023)   PRAPARE - Administrator, Civil Service (Medical): No    Lack of Transportation (Non-Medical): No  Physical Activity: Sufficiently Active (02/19/2023)   Exercise Vital Sign    Days of Exercise per Week: 7 days    Minutes of Exercise per Session: 40 min  Stress: No Stress Concern Present (02/19/2023)   [employer] of Occupational Health - Occupational Stress Questionnaire    Feeling of Stress : Not at all  Social Connections: Moderately Integrated (02/19/2023)   Social Connection and Isolation Panel [NHANES]    Frequency of Communication with Friends and Family: More than three times a week    Frequency of Social Gatherings with Friends and Family: More than three times a week    Attends Religious Services: More than 4 times per year    Active Member of [employer] or Organizations: No    Attends Banker Meetings: Not on file    Marital Status: Married  Intimate Partner Violence: Not At Risk (02/24/2021)   Humiliation, Afraid, Rape,  and Kick questionnaire    Fear of Current or Ex-Partner: No    Emotionally Abused: No    Physically Abused: No    Sexually Abused: No    Outpatient Medications Prior to Visit  Medication Sig Dispense Refill   ibuprofen (ADVIL) 600 MG tablet Take 1 tablet (600 mg total) by mouth every 6 (six) hours as needed. 30 tablet 0   triamcinolone cream (KENALOG) 0.1 % Apply 1 Application topically 2 (two) times daily. 30 g 0   cefadroxil (DURICEF) 500 MG capsule Take 1 capsule (500 mg total) by mouth 2 (two) times daily. (Patient not taking: Reported on 02/19/2023) 10 capsule 0   phenazopyridine (PYRIDIUM) 200 MG tablet Take 1 tablet (200 mg total) by mouth 3 (three) times daily as needed  for pain. (Patient not taking: Reported on 02/19/2023) 6 tablet 0   SODIUM FLUORIDE 5000 PPM 1.1 % PSTE PLEASE SEE ATTACHED FOR DETAILED DIRECTIONS (Patient not taking: Reported on 02/19/2023)     No facility-administered medications prior to visit.    No Known Allergies  ROS Review of Systems  Constitutional:  Negative for chills and fever.  Eyes:  Negative for visual disturbance.  Respiratory:  Negative for chest tightness and shortness of breath.   Genitourinary:  Negative for dysuria, frequency and urgency.  Neurological:  Negative for dizziness and headaches.      Objective:    Physical Exam HENT:     Head: Normocephalic.     Mouth/Throat:     Mouth: Mucous membranes are moist.  Cardiovascular:     Rate and Rhythm: Normal rate.     Heart sounds: Normal heart sounds.  Pulmonary:     Effort: Pulmonary effort is normal.     Breath sounds: Normal breath sounds.  Abdominal:     Tenderness: There is no abdominal tenderness. There is no right CVA tenderness or left CVA tenderness.  Neurological:     Mental Status: She is alert.     BP 126/84 (BP Location: Right Arm, Patient Position: Sitting, Cuff Size: Normal)   Pulse 75   Ht 5' 4.5" (1.638 m)   Wt 239 lb 1.9 oz (108.5 kg)   SpO2 93%   BMI 40.41 kg/m  Wt Readings from Last 3 Encounters:  02/19/23 239 lb 1.9 oz (108.5 kg)  02/06/23 236 lb (107 kg)  12/01/21 250 lb (113.4 kg)    Lab Results  Component Value Date   TSH 2.030 02/24/2021   Lab Results  Component Value Date   WBC 9.4 02/07/2023   HGB 12.1 02/07/2023   HCT 36.3 02/07/2023   MCV 89.4 02/07/2023   PLT 176 02/07/2023   Lab Results  Component Value Date   NA 138 02/07/2023   K 3.6 02/07/2023   CO2 24 02/07/2023   GLUCOSE 107 (H) 02/07/2023   BUN 10 02/07/2023   CREATININE 0.85 02/07/2023   BILITOT 0.4 02/07/2023   ALKPHOS 76 02/07/2023   AST 22 02/07/2023   ALT 25 02/07/2023   PROT 6.8 02/07/2023   ALBUMIN 3.7 02/07/2023   CALCIUM 8.4  (L) 02/07/2023   ANIONGAP 7 02/07/2023   No results found for: "CHOL" No results found for: "HDL" No results found for: "LDLCALC" No results found for: "TRIG" No results found for: "CHOLHDL" Lab Results  Component Value Date   HGBA1C 5.7 (H) 02/24/2021      Assessment & Plan:  Generalized abdominal pain Assessment & Plan: Reviewed preventive measures of UTI To help prevent future  urinary tract infections (UTIs), consider the following practices:  Avoid Prolonged Urination: Do not hold urine for extended periods, as this can stretch the bladder and create an environment conducive to bacterial growth. Prompt Bladder Emptying: Empty your bladder as soon as you feel the urge. Post-Intercourse Hygiene: Empty your bladder soon after intercourse to reduce the risk of infection. Shower Instead of Bath: Opt for showers rather than baths to minimize bacterial exposure. Proper Wiping Technique: Wipe from front to back after urinating and bowel movements to prevent the transfer of bacteria from the anal region to the vagina and urethra. Hydration: Drink a full glass of water regularly to help flush bacteria from your system.    IFG (impaired fasting glucose) -     Hemoglobin A1c  Vitamin D deficiency -     VITAMIN D 25 Hydroxy (Vit-D Deficiency, Fractures)  TSH (thyroid-stimulating hormone deficiency) -     TSH + free T4  Other hyperlipidemia -     Lipid panel -     CMP14+EGFR -     CBC with Differential/Platelet  Note: This chart has been completed using Engineer, civil (consulting) software, and while attempts have been made to ensure accuracy, certain words and phrases may not be transcribed as intended.    Follow-up: Return in about 6 months (around 08/20/2023).   Gilmore Laroche, FNP

## 2023-03-10 LAB — CBC WITH DIFFERENTIAL/PLATELET
Basophils Absolute: 0.1 10*3/uL (ref 0.0–0.2)
Basos: 1 %
EOS (ABSOLUTE): 0.3 10*3/uL (ref 0.0–0.4)
Eos: 3 %
Hematocrit: 37.2 % (ref 34.0–46.6)
Hemoglobin: 12.2 g/dL (ref 11.1–15.9)
Immature Grans (Abs): 0 10*3/uL (ref 0.0–0.1)
Immature Granulocytes: 0 %
Lymphocytes Absolute: 3.1 10*3/uL (ref 0.7–3.1)
Lymphs: 35 %
MCH: 30.1 pg (ref 26.6–33.0)
MCHC: 32.8 g/dL (ref 31.5–35.7)
MCV: 92 fL (ref 79–97)
Monocytes Absolute: 0.6 10*3/uL (ref 0.1–0.9)
Monocytes: 7 %
Neutrophils Absolute: 4.7 10*3/uL (ref 1.4–7.0)
Neutrophils: 54 %
Platelets: 174 10*3/uL (ref 150–450)
RBC: 4.05 x10E6/uL (ref 3.77–5.28)
RDW: 14.2 % (ref 11.7–15.4)
WBC: 8.7 10*3/uL (ref 3.4–10.8)

## 2023-03-10 LAB — HEMOGLOBIN A1C
Est. average glucose Bld gHb Est-mCnc: 126 mg/dL
Hgb A1c MFr Bld: 6 % — ABNORMAL HIGH (ref 4.8–5.6)

## 2023-03-10 LAB — CMP14+EGFR
ALT: 23 [IU]/L (ref 0–32)
AST: 21 [IU]/L (ref 0–40)
Albumin: 4 g/dL (ref 3.9–4.9)
Alkaline Phosphatase: 92 [IU]/L (ref 44–121)
BUN/Creatinine Ratio: 10 (ref 9–23)
BUN: 9 mg/dL (ref 6–20)
Bilirubin Total: 0.3 mg/dL (ref 0.0–1.2)
CO2: 22 mmol/L (ref 20–29)
Calcium: 8.7 mg/dL (ref 8.7–10.2)
Chloride: 104 mmol/L (ref 96–106)
Creatinine, Ser: 0.9 mg/dL (ref 0.57–1.00)
Globulin, Total: 2.6 g/dL (ref 1.5–4.5)
Glucose: 89 mg/dL (ref 70–99)
Potassium: 4.1 mmol/L (ref 3.5–5.2)
Sodium: 139 mmol/L (ref 134–144)
Total Protein: 6.6 g/dL (ref 6.0–8.5)
eGFR: 85 mL/min/{1.73_m2} (ref >59)

## 2023-03-10 LAB — LIPID PANEL
Chol/HDL Ratio: 3.3 {ratio} (ref 0.0–4.4)
Cholesterol, Total: 177 mg/dL (ref 100–199)
HDL: 53 mg/dL (ref >39)
LDL Chol Calc (NIH): 106 mg/dL — ABNORMAL HIGH (ref 0–99)
Triglycerides: 101 mg/dL (ref 0–149)
VLDL Cholesterol Cal: 18 mg/dL (ref 5–40)

## 2023-03-10 LAB — VITAMIN D 25 HYDROXY (VIT D DEFICIENCY, FRACTURES): Vit D, 25-Hydroxy: 12.7 ng/mL — ABNORMAL LOW (ref 30.0–100.0)

## 2023-03-10 LAB — TSH+FREE T4
Free T4: 1.29 ng/dL (ref 0.82–1.77)
TSH: 3.26 u[IU]/mL (ref 0.450–4.500)

## 2023-03-21 ENCOUNTER — Other Ambulatory Visit: Payer: Self-pay | Admitting: Family Medicine

## 2023-03-21 DIAGNOSIS — E559 Vitamin D deficiency, unspecified: Secondary | ICD-10-CM

## 2023-03-21 MED ORDER — VITAMIN D (ERGOCALCIFEROL) 1.25 MG (50000 UNIT) PO CAPS
50000.0000 [IU] | ORAL_CAPSULE | ORAL | 1 refills | Status: DC
Start: 2023-03-21 — End: 2023-08-13

## 2023-05-12 NOTE — L&D Delivery Note (Signed)
 [facility] Delivery Note  Dominique Garza is a 38 y.o. [id number] s/p SVD at [redacted]w[redacted]d. She was admitted for IOL for gHTN.   ROM: 6h 30m with clear fluid GBS Status: positive, adequate PCN ppx Maximum Maternal Temperature: 98.72F  Labor Progress: Initial SVE fingertip, progressed with vaginal cytotec, Cook's catheter, pitocin  and AROM  Delivery Date/Time: 03/22/2024 1047 Delivery: Called to room and patient was complete and pushing. Head delivered LOA. No nuchal cord present. Shoulder and body delivered in usual fashion. Infant with spontaneous cry, placed on mother's abdomen, dried and stimulated. Cord clamped x 2 after 1-minute delay, and cut by father of baby. Cord blood drawn. Placenta delivered spontaneously, intact, with 3-vessel cord. Fundus firm with massage and Pitocin . Labia, perineum, vagina, and cervix inspected, bilateral periurethral and 1st degree labial lacerations found. 1st degree labial repaired with 4-0 Vicryl, periurethral lacerations hemostatic and not repaired.   Placenta: Intact, delivered spontaneously Complications: None Lacerations: See above EBL: 57mL Analgesia: Epidural  Infant: Viable female  APGARs 8,9  3240g  Charlie DELENA Courts, MD 03/22/2024, 11:40 AM

## 2023-08-13 ENCOUNTER — Ambulatory Visit: Payer: Self-pay

## 2023-08-13 VITALS — BP 130/86 | HR 83 | Ht 64.0 in | Wt 253.0 lb

## 2023-08-13 DIAGNOSIS — E559 Vitamin D deficiency, unspecified: Secondary | ICD-10-CM

## 2023-08-13 DIAGNOSIS — Z3201 Encounter for pregnancy test, result positive: Secondary | ICD-10-CM | POA: Insufficient documentation

## 2023-08-13 DIAGNOSIS — R7301 Impaired fasting glucose: Secondary | ICD-10-CM | POA: Insufficient documentation

## 2023-08-13 DIAGNOSIS — E038 Other specified hypothyroidism: Secondary | ICD-10-CM

## 2023-08-13 NOTE — Assessment & Plan Note (Signed)
 Currently not taking vitamin D supplement.  Recheck levels.

## 2023-08-13 NOTE — Patient Instructions (Signed)
 Check labs today for f/u on previous abnormalities and to confirm pregnancy status.    Recommend taking a prenatal vitamin and scheduling an appointment with OB/GYN soon.

## 2023-08-13 NOTE — Assessment & Plan Note (Signed)
 Recheck labs today.  Recommend working on weight reduction with a healthy low-carbohydrate diet and regular exercise.

## 2023-08-13 NOTE — Progress Notes (Signed)
 Established Patient Office Visit  Subjective   Patient ID: Dominique Garza, female    DOB: [birth year]  Age: 38 y.o. MRN: [id number]  Chief Complaint  Patient presents with   Care Management    Pt states after "she made an appointment she took a pregnancy tests and both was positive." Pt states "breast have lumps like tissues swollen and been hurting for a couple of months" Pt also states " she has really swollen feet"     HPI    Review of Systems  Constitutional: Negative.        Breast tenderness   HENT: Negative.    Eyes: Negative.   Respiratory:  Positive for shortness of breath.   Cardiovascular:  Positive for leg swelling.  Gastrointestinal: Negative.   Genitourinary: Negative.   Musculoskeletal: Negative.   Skin: Negative.   Neurological: Negative.   Psychiatric/Behavioral: Negative.        Objective:     BP 130/86 (BP Location: Right Arm, Patient Position: Sitting, Cuff Size: Large)   Pulse 83   Ht 5\' 4"  (1.626 m)   Wt 253 lb (114.8 kg)   SpO2 97%   BMI 43.43 kg/m    Physical Exam Vitals and nursing note reviewed.  Constitutional:      Appearance: Normal appearance.  HENT:     Head: Normocephalic.  Cardiovascular:     Rate and Rhythm: Normal rate and regular rhythm.  Pulmonary:     Effort: Pulmonary effort is normal.     Breath sounds: Normal breath sounds.  Chest:  Breasts:    Breasts are symmetrical.     Right: Tenderness present. No inverted nipple, mass, nipple discharge or skin change.     Left: Tenderness present. No inverted nipple, mass, nipple discharge or skin change.  Musculoskeletal:     Cervical back: Normal range of motion and neck supple.  Lymphadenopathy:     Upper Body:     Right upper body: No supraclavicular, axillary or pectoral adenopathy.     Left upper body: No supraclavicular, axillary or pectoral adenopathy.  Skin:    General: Skin is warm and dry.  Neurological:     Mental Status: She is alert and oriented to  person, place, and time.  Psychiatric:        Mood and Affect: Mood normal.        Thought Content: Thought content normal.      No results found for any visits on 08/13/23.    The ASCVD Risk score (Arnett DK, et al., 2019) failed to calculate for the following reasons:   The 2019 ASCVD risk score is only valid for ages 64 to 42    Assessment & Plan:   Problem List Items Addressed This Visit       Endocrine   IFG (impaired fasting glucose)   Recheck labs today.  Recommend working on weight reduction with a healthy low-carbohydrate diet and regular exercise.        Relevant Orders   CMP14+EGFR   HgB A1c   RESOLVED: TSH (thyroid-stimulating hormone deficiency)     Other   Positive urine pregnancy test - Primary   She is uncertain of when LMP was d/t irregular periods.  Had two positive home pregnancy tests.  Will obtain qualitative pregnancy test in office today.  Recommend taking a prenatal vitamin and scheduling an appointment with OB/GYN soon.       Relevant Orders   hCG, serum, qualitative   Ambulatory referral  to Obstetrics / Gynecology   Vitamin D deficiency   Currently not taking vitamin D supplement.  Recheck levels.        Relevant Orders   Vitamin D (25 hydroxy)    No follow-ups on file.    Darral Dash, FNP

## 2023-08-13 NOTE — Assessment & Plan Note (Signed)
 She is uncertain of when LMP was d/t irregular periods.  Had two positive home pregnancy tests.  Will obtain qualitative pregnancy test in office today.  Recommend taking a prenatal vitamin and scheduling an appointment with OB/GYN soon.

## 2023-08-14 LAB — CMP14+EGFR
ALT: 11 IU/L (ref 0–32)
AST: 15 IU/L (ref 0–40)
Albumin: 4.1 g/dL (ref 3.9–4.9)
Alkaline Phosphatase: 71 IU/L (ref 44–121)
BUN/Creatinine Ratio: 10 (ref 9–23)
BUN: 8 mg/dL (ref 6–20)
Bilirubin Total: 0.3 mg/dL (ref 0.0–1.2)
CO2: 19 mmol/L — ABNORMAL LOW (ref 20–29)
Calcium: 9.3 mg/dL (ref 8.7–10.2)
Chloride: 103 mmol/L (ref 96–106)
Creatinine, Ser: 0.84 mg/dL (ref 0.57–1.00)
Globulin, Total: 2.7 g/dL (ref 1.5–4.5)
Glucose: 69 mg/dL — ABNORMAL LOW (ref 70–99)
Potassium: 4 mmol/L (ref 3.5–5.2)
Sodium: 135 mmol/L (ref 134–144)
Total Protein: 6.8 g/dL (ref 6.0–8.5)
eGFR: 92 mL/min/{1.73_m2} (ref >59)

## 2023-08-14 LAB — HCG, SERUM, QUALITATIVE: hCG,Beta Subunit,Qual,Serum: POSITIVE m[IU]/mL — AB (ref <6)

## 2023-08-14 LAB — HEMOGLOBIN A1C
Est. average glucose Bld gHb Est-mCnc: 117 mg/dL
Hgb A1c MFr Bld: 5.7 % — ABNORMAL HIGH (ref 4.8–5.6)

## 2023-08-14 LAB — VITAMIN D 25 HYDROXY (VIT D DEFICIENCY, FRACTURES): Vit D, 25-Hydroxy: 11 ng/mL — ABNORMAL LOW (ref 30.0–100.0)

## 2023-08-16 ENCOUNTER — Other Ambulatory Visit: Payer: Self-pay | Admitting: *Deleted

## 2023-08-16 DIAGNOSIS — Z789 Other specified health status: Secondary | ICD-10-CM

## 2023-08-17 ENCOUNTER — Other Ambulatory Visit: Payer: Self-pay | Admitting: *Deleted

## 2023-08-17 DIAGNOSIS — Z789 Other specified health status: Secondary | ICD-10-CM

## 2023-08-17 LAB — HCG, QUANTITATIVE, PREGNANCY: HCG, Total, QN: 23671 m[IU]/mL

## 2023-08-18 ENCOUNTER — Other Ambulatory Visit: Payer: Self-pay | Admitting: Women's Health

## 2023-08-18 DIAGNOSIS — Z3491 Encounter for supervision of normal pregnancy, unspecified, first trimester: Secondary | ICD-10-CM

## 2023-08-18 NOTE — Progress Notes (Signed)
 HCG 13,086 yesterday (per range anywhere from 4-8wks), note routed to [place] to schedule dating u/s sometime in next 2wks.  Cheral Marker, CNM, [facility] 08/18/2023 12:03 PM

## 2023-08-20 ENCOUNTER — Ambulatory Visit: Payer: Medicaid Other | Admitting: Family Medicine

## 2023-08-20 ENCOUNTER — Encounter: Payer: Self-pay | Admitting: Women's Health

## 2023-08-23 ENCOUNTER — Ambulatory Visit: Payer: Medicaid Other | Admitting: Family Medicine

## 2023-09-01 ENCOUNTER — Ambulatory Visit (INDEPENDENT_AMBULATORY_CARE_PROVIDER_SITE_OTHER)

## 2023-09-01 DIAGNOSIS — Z3491 Encounter for supervision of normal pregnancy, unspecified, first trimester: Secondary | ICD-10-CM | POA: Diagnosis not present

## 2023-09-01 DIAGNOSIS — Z3A08 8 weeks gestation of pregnancy: Secondary | ICD-10-CM | POA: Diagnosis not present

## 2023-09-01 NOTE — Progress Notes (Signed)
 US : GA: unknown LMP Anteverted uterus with single viable early IUP.  GS intact within mid upper cavity. CRL = 20.5 mm = 8+5 weeks,  FHR = 168 bpm, YS = 4.5 mm Nl ov's, neg adnexal regions - neg CDS - no free fluid present EDD by u/s = 04-07-24

## 2023-10-01 ENCOUNTER — Other Ambulatory Visit: Payer: Self-pay | Admitting: Obstetrics & Gynecology

## 2023-10-01 DIAGNOSIS — Z3682 Encounter for antenatal screening for nuchal translucency: Secondary | ICD-10-CM

## 2023-10-05 ENCOUNTER — Ambulatory Visit: Admitting: Women's Health

## 2023-10-05 ENCOUNTER — Ambulatory Visit

## 2023-10-05 ENCOUNTER — Encounter: Payer: Self-pay | Admitting: Women's Health

## 2023-10-05 ENCOUNTER — Encounter: Admitting: *Deleted

## 2023-10-05 VITALS — BP 123/75 | HR 101 | Wt 251.0 lb

## 2023-10-05 DIAGNOSIS — Z3490 Encounter for supervision of normal pregnancy, unspecified, unspecified trimester: Secondary | ICD-10-CM | POA: Insufficient documentation

## 2023-10-05 DIAGNOSIS — Z3480 Encounter for supervision of other normal pregnancy, unspecified trimester: Secondary | ICD-10-CM | POA: Diagnosis not present

## 2023-10-05 DIAGNOSIS — O0990 Supervision of high risk pregnancy, unspecified, unspecified trimester: Secondary | ICD-10-CM | POA: Insufficient documentation

## 2023-10-05 DIAGNOSIS — Z3481 Encounter for supervision of other normal pregnancy, first trimester: Secondary | ICD-10-CM | POA: Diagnosis not present

## 2023-10-05 DIAGNOSIS — O09529 Supervision of elderly multigravida, unspecified trimester: Secondary | ICD-10-CM | POA: Insufficient documentation

## 2023-10-05 DIAGNOSIS — Z131 Encounter for screening for diabetes mellitus: Secondary | ICD-10-CM | POA: Diagnosis not present

## 2023-10-05 DIAGNOSIS — O09291 Supervision of pregnancy with other poor reproductive or obstetric history, first trimester: Secondary | ICD-10-CM | POA: Diagnosis not present

## 2023-10-05 DIAGNOSIS — R7309 Other abnormal glucose: Secondary | ICD-10-CM

## 2023-10-05 DIAGNOSIS — Z3A13 13 weeks gestation of pregnancy: Secondary | ICD-10-CM

## 2023-10-05 DIAGNOSIS — O09299 Supervision of pregnancy with other poor reproductive or obstetric history, unspecified trimester: Secondary | ICD-10-CM | POA: Insufficient documentation

## 2023-10-05 DIAGNOSIS — Z8759 Personal history of other complications of pregnancy, childbirth and the puerperium: Secondary | ICD-10-CM | POA: Diagnosis not present

## 2023-10-05 DIAGNOSIS — Z3682 Encounter for antenatal screening for nuchal translucency: Secondary | ICD-10-CM

## 2023-10-05 DIAGNOSIS — Z6841 Body Mass Index (BMI) 40.0 and over, adult: Secondary | ICD-10-CM | POA: Diagnosis not present

## 2023-10-05 DIAGNOSIS — Z1332 Encounter for screening for maternal depression: Secondary | ICD-10-CM | POA: Diagnosis not present

## 2023-10-05 DIAGNOSIS — O0991 Supervision of high risk pregnancy, unspecified, first trimester: Secondary | ICD-10-CM

## 2023-10-05 MED ORDER — ASPIRIN 81 MG PO TBEC: 162.0000 mg | DELAYED_RELEASE_TABLET | Freq: Every day | ORAL | 2 refills | Status: DC

## 2023-10-05 MED ORDER — BLOOD PRESSURE MONITOR MISC
0 refills | Status: DC
Start: 2023-10-05 — End: 2024-04-06

## 2023-10-05 NOTE — Patient Instructions (Signed)
 Dominique Garza, thank you for choosing our office today! We appreciate the opportunity to meet your healthcare needs. You may receive a short survey by mail, e-mail, or through [employer]. If you are happy with your care we would appreciate if you could take just a few minutes to complete the survey questions. We read all of your comments and take your feedback very seriously. Thank you again for choosing our office.  Center for [employer] [facility] Team at [facility]  [facility] at [facility] ([address] [place], [place] [address]) Entrance C, located off of [address] Free 24/7 valet parking   Nausea & Vomiting Have saltine crackers or pretzels by your bed and eat a few bites before you raise your head out of bed in the morning Eat small frequent meals throughout the day instead of large meals Drink plenty of fluids throughout the day to stay hydrated, just don't drink a lot of fluids with your meals.  This can make your stomach fill up faster making you feel sick Do not brush your teeth right after you eat Products with real ginger are good for nausea, like ginger ale and ginger hard candy Make sure it says made with real ginger! Sucking on sour candy like lemon heads is also good for nausea If your prenatal vitamins make you nauseated, take them at night so you will sleep through the nausea Sea Bands If you feel like you need medicine for the nausea & vomiting please let us  know If you are unable to keep any fluids or food down please let us  know   Constipation Drink plenty of fluid, preferably water, throughout the day Eat foods high in fiber such as fruits, vegetables, and grains Exercise, such as walking, is a good way to keep your bowels regular Drink warm fluids, especially warm prune juice, or decaf coffee Eat a 1/2 cup of real oatmeal (not instant), 1/2 cup applesauce, and 1/2-1 cup warm prune juice every day If needed, you may take Colace (docusate sodium ) stool softener  once or twice a day to help keep the stool soft.  If you still are having problems with constipation, you may take Miralax once daily as needed to help keep your bowels regular.   Home Blood Pressure Monitoring for Patients   Your provider has recommended that you check your blood pressure (BP) at least once a week at home. If you do not have a blood pressure cuff at home, one will be provided for you. Contact your provider if you have not received your monitor within 1 week.   Helpful Tips for Accurate Home Blood Pressure Checks  Don't smoke, exercise, or drink caffeine 30 minutes before checking your BP Use the restroom before checking your BP (a full bladder can raise your pressure) Relax in a comfortable upright chair Feet on the ground Left arm resting comfortably on a flat surface at the level of your heart Legs uncrossed Back supported Sit quietly and don't talk Place the cuff on your bare arm Adjust snuggly, so that only two fingertips can fit between your skin and the top of the cuff Check 2 readings separated by at least one minute Keep a log of your BP readings For a visual, please reference this diagram: http://ccnc.care/bpdiagram  Provider Name: [facility]     Phone: [phone]  Zone 1: ALL CLEAR  Continue to monitor your symptoms:  BP reading is less than 140 (top number) or less than 90 (bottom  number)  No right upper stomach pain No headaches or seeing spots No feeling nauseated or throwing up No swelling in face and hands  Zone 2: CAUTION Call your doctor's office for any of the following:  BP reading is greater than 140 (top number) or greater than 90 (bottom number)  Stomach pain under your ribs in the middle or right side Headaches or seeing spots Feeling nauseated or throwing up Swelling in face and hands  Zone 3: EMERGENCY  Seek immediate medical care if you have any of the following:  BP reading is greater than160 (top number) or greater than  110 (bottom number) Severe headaches not improving with Tylenol  Serious difficulty catching your breath Any worsening symptoms from Zone 2    First Trimester of Pregnancy The first trimester of pregnancy is from week 1 until the end of week 12 (months 1 through 3). A week after a sperm fertilizes an egg, the egg will implant on the wall of the uterus. This embryo will begin to develop into a baby. Genes from you and your partner are forming the baby. The female genes determine whether the baby is a boy or a girl. At 6-8 weeks, the eyes and face are formed, and the heartbeat can be seen on ultrasound. At the end of 12 weeks, all the baby's organs are formed.  Now that you are pregnant, you will want to do everything you can to have a healthy baby. Two of the most important things are to get good prenatal care and to follow your health care provider's instructions. Prenatal care is all the medical care you receive before the baby's birth. This care will help prevent, find, and treat any problems during the pregnancy and childbirth. BODY CHANGES Your body goes through many changes during pregnancy. The changes vary from woman to woman.  You may gain or lose a couple of pounds at first. You may feel sick to your stomach (nauseous) and throw up (vomit). If the vomiting is uncontrollable, call your health care provider. You may tire easily. You may develop headaches that can be relieved by medicines approved by your health care provider. You may urinate more often. Painful urination may mean you have a bladder infection. You may develop heartburn as a result of your pregnancy. You may develop constipation because certain hormones are causing the muscles that push waste through your intestines to slow down. You may develop hemorrhoids or swollen, bulging veins (varicose veins). Your breasts may begin to grow larger and become tender. Your nipples may stick out more, and the tissue that surrounds them  (areola) may become darker. Your gums may bleed and may be sensitive to brushing and flossing. Dark spots or blotches (chloasma, mask of pregnancy) may develop on your face. This will likely fade after the baby is born. Your menstrual periods will stop. You may have a loss of appetite. You may develop cravings for certain kinds of food. You may have changes in your emotions from day to day, such as being excited to be pregnant or being concerned that something may go wrong with the pregnancy and baby. You may have more vivid and strange dreams. You may have changes in your hair. These can include thickening of your hair, rapid growth, and changes in texture. Some women also have hair loss during or after pregnancy, or hair that feels dry or thin. Your hair will most likely return to normal after your baby is born. WHAT TO EXPECT AT YOUR PRENATAL  VISITS During a routine prenatal visit: You will be weighed to make sure you and the baby are growing normally. Your blood pressure will be taken. Your abdomen will be measured to track your baby's growth. The fetal heartbeat will be listened to starting around week 10 or 12 of your pregnancy. Test results from any previous visits will be discussed. Your health care provider may ask you: How you are feeling. If you are feeling the baby move. If you have had any abnormal symptoms, such as leaking fluid, bleeding, severe headaches, or abdominal cramping. If you have any questions. Other tests that may be performed during your first trimester include: Blood tests to find your blood type and to check for the presence of any previous infections. They will also be used to check for low iron levels (anemia) and Rh antibodies. Later in the pregnancy, blood tests for diabetes will be done along with other tests if problems develop. Urine tests to check for infections, diabetes, or protein in the urine. An ultrasound to confirm the proper growth and development  of the baby. An amniocentesis to check for possible genetic problems. Fetal screens for spina bifida and Down syndrome. You may need other tests to make sure you and the baby are doing well. HOME CARE INSTRUCTIONS  Medicines Follow your health care provider's instructions regarding medicine use. Specific medicines may be either safe or unsafe to take during pregnancy. Take your prenatal vitamins as directed. If you develop constipation, try taking a stool softener if your health care provider approves. Diet Eat regular, well-balanced meals. Choose a variety of foods, such as meat or vegetable-based protein, fish, milk and low-fat dairy products, vegetables, fruits, and whole grain breads and cereals. Your health care provider will help you determine the amount of weight gain that is right for you. Avoid raw meat and uncooked cheese. These carry germs that can cause birth defects in the baby. Eating four or five small meals rather than three large meals a day may help relieve nausea and vomiting. If you start to feel nauseous, eating a few soda crackers can be helpful. Drinking liquids between meals instead of during meals also seems to help nausea and vomiting. If you develop constipation, eat more high-fiber foods, such as fresh vegetables or fruit and whole grains. Drink enough fluids to keep your urine clear or pale yellow. Activity and Exercise Exercise only as directed by your health care provider. Exercising will help you: Control your weight. Stay in shape. Be prepared for labor and delivery. Experiencing pain or cramping in the lower abdomen or low back is a good sign that you should stop exercising. Check with your health care provider before continuing normal exercises. Try to avoid standing for long periods of time. Move your legs often if you must stand in one place for a long time. Avoid heavy lifting. Wear low-heeled shoes, and practice good posture. You may continue to have sex  unless your health care provider directs you otherwise. Relief of Pain or Discomfort Wear a good support bra for breast tenderness.   Take warm sitz baths to soothe any pain or discomfort caused by hemorrhoids. Use hemorrhoid cream if your health care provider approves.   Rest with your legs elevated if you have leg cramps or low back pain. If you develop varicose veins in your legs, wear support hose. Elevate your feet for 15 minutes, 3-4 times a day. Limit salt in your diet. Prenatal Care Schedule your prenatal visits by the  twelfth week of pregnancy. They are usually scheduled monthly at first, then more often in the last 2 months before delivery. Write down your questions. Take them to your prenatal visits. Keep all your prenatal visits as directed by your health care provider. Safety Wear your seat belt at all times when driving. Make a list of emergency phone numbers, including numbers for family, friends, the hospital, and police and fire departments. General Tips Ask your health care provider for a referral to a local prenatal education class. Begin classes no later than at the beginning of month 6 of your pregnancy. Ask for help if you have counseling or nutritional needs during pregnancy. Your health care provider can offer advice or refer you to specialists for help with various needs. Do not use hot tubs, steam rooms, or saunas. Do not douche or use tampons or scented sanitary pads. Do not cross your legs for long periods of time. Avoid cat litter boxes and soil used by cats. These carry germs that can cause birth defects in the baby and possibly loss of the fetus by miscarriage or stillbirth. Avoid all smoking, herbs, alcohol, and medicines not prescribed by your health care provider. Chemicals in these affect the formation and growth of the baby. Schedule a dentist appointment. At home, brush your teeth with a soft toothbrush and be gentle when you floss. SEEK MEDICAL CARE IF:   You have dizziness. You have mild pelvic cramps, pelvic pressure, or nagging pain in the abdominal area. You have persistent nausea, vomiting, or diarrhea. You have a bad smelling vaginal discharge. You have pain with urination. You notice increased swelling in your face, hands, legs, or ankles. SEEK IMMEDIATE MEDICAL CARE IF:  You have a fever. You are leaking fluid from your vagina. You have spotting or bleeding from your vagina. You have severe abdominal cramping or pain. You have rapid weight gain or loss. You vomit blood or material that looks like coffee grounds. You are exposed to [place] measles and have never had them. You are exposed to fifth disease or chickenpox. You develop a severe headache. You have shortness of breath. You have any kind of trauma, such as from a fall or a car accident. Document Released: 04/21/2001 Document Revised: 09/11/2013 Document Reviewed: 03/07/2013 [facility] Patient Information 2015 [place], [place]. This information is not intended to replace advice given to you by your health care provider. Make sure you discuss any questions you have with your health care provider.

## 2023-10-05 NOTE — Progress Notes (Signed)
 US  13+4 wks,measurement c/w dates,anterior placenta,FHR 147 bpm,NB present,NT 1.8 mm,CRL 74.78 mm

## 2023-10-05 NOTE — Progress Notes (Signed)
 INITIAL OBSTETRICAL VISIT Patient name: Dominique Garza MRN [id number]  Date of birth: 30-May-1985 Chief Complaint:   Initial Prenatal Visit (Concerned about lifting at work)  History of Present Illness:   Dominique Garza is a 38 y.o. [id number] African-American female at [redacted]w[redacted]d by US  at 8 weeks with an Estimated Date of Delivery: 04/07/24 being seen today for her initial obstetrical visit.   No LMP recorded. Patient is pregnant. Her obstetrical history is significant for SAB x 1, 2009: 40wk SVB-FGR, 2011: 36wk PTB d/t severe pre-e/FGR, 2012: 40wk SVB-FGR, 2016: 37wk SVB, GHTN, FGR & PPHTN.   Today she reports n/v from 3p-6p, declines meds. Works at [employer]- needs note w/ lifting restrictions (given). Last pap 02/24/21. Results were: NILM w/ HRHPV negative     10/05/2023   10:16 AM 08/13/2023    1:16 PM 12/01/2021    2:41 PM 08/25/2021    3:19 PM 02/24/2021    9:50 AM  Depression screen PHQ 2/9  Decreased Interest 1 0 0 0 0  Down, Depressed, Hopeless 0 0 0 0 0  PHQ - 2 Score 1 0 0 0 0  Altered sleeping 1 1   0  Tired, decreased energy 1 1   0  Change in appetite 0 1   0  Feeling bad or failure about yourself  0 0   0  Trouble concentrating 0 0   0  Moving slowly or fidgety/restless 0 0   0  Suicidal thoughts 0 0   0  PHQ-9 Score 3 3   0  Difficult doing work/chores  Not difficult at all           10/05/2023   10:16 AM 08/13/2023    1:17 PM 02/24/2021    9:55 AM  GAD 7 : Generalized Anxiety Score  Nervous, Anxious, on Edge 0 0 0  Control/stop worrying 0 0 0  Worry too much - different things 0 0 0  Trouble relaxing 0 0 0  Restless 0 0 0  Easily annoyed or irritable 0 0 0  Afraid - awful might happen 0 0 0  Total GAD 7 Score 0 0 0  Anxiety Difficulty  Not difficult at all      Review of Systems:   Pertinent items are noted in HPI Denies cramping/contractions, leakage of fluid, vaginal bleeding, abnormal vaginal discharge w/ itching/odor/irritation, headaches, visual  changes, shortness of breath, chest pain, abdominal pain, severe nausea/vomiting, or problems with urination or bowel movements unless otherwise stated above.  Pertinent History Reviewed:  Reviewed past medical,surgical, social, obstetrical and family history.  Reviewed problem list, medications and allergies. OB History  Gravida Para Term Preterm AB Living  6 4 3 1 1 4   SAB IAB Ectopic Multiple Live Births  1   0 4    # Outcome Date GA Lbr Len/2nd Weight Sex Type Anes PTL Lv  6 Current           5 Term 09/02/14 [redacted]w[redacted]d / 00:41 5 lb 15.8 oz (2.715 kg) F Vag-Spont EPI  LIV  4 SAB 07/2013          3 Term 02/15/11 [redacted]w[redacted]d  6 lb 6 oz (2.892 kg) M Vag-Spont EPI  LIV  2 Preterm 02/26/10 [redacted]w[redacted]d  3 lb 11 oz (1.673 kg) F Vag-Spont EPI  LIV     Birth Comments: d/t pre-e  1 Term 02/01/08 108w0d  5 lb 14 oz (2.665 kg) F Vag-Spont EPI  LIV  Physical Assessment:   Vitals:   10/05/23 1026  BP: 123/75  Pulse: (!) 101  Weight: 251 lb (113.9 kg)  Body mass index is 43.08 kg/m.       Physical Examination:  General appearance - well appearing, and in no distress  Mental status - alert, oriented to person, place, and time  Psych:  She has a normal mood and affect  Skin - warm and dry, normal color, no suspicious lesions noted  Chest - effort normal, all lung fields clear to auscultation bilaterally  Heart - normal rate and regular rhythm  Abdomen - soft, nontender  Extremities:  No swelling or varicosities noted  Thin prep pap is not done   Chaperone: N/A  TODAY'S NT US  13+4 wks,measurement c/w dates,anterior placenta,FHR 147 bpm,NB present,NT 1.8 mm,CRL 74.78 mm   No results found for this or any previous visit (from the past 24 hours).  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 Swallow whole.    Dispense:  180 tablet    Refill:  2    Initial labs obtained Continue prenatal vitamins Reviewed n/v relief measures and warning s/s to report Reviewed recommended weight gain based on pre-gravid BMI Encouraged well-balanced diet Genetic & carrier screening discussed: requests Panorama and NT/IT, CF neg prev preg Ultrasound discussed; fetal survey: requested CCNC completed> form faxed if has or is planning to apply for medicaid The nature of [facility] [employer] with multiple MDs and other Advanced Practice Providers was explained to patient; also emphasized that fellows, residents, and students are part of our team. Does not have home bp cuff. Office bp cuff given: no. Rx sent: yes. Check bp weekly, let us  know if consistently >140/90.   Follow-up: Return for Fri GTT/labs, 4wks HROB, 2nd IT, MD or CNM, in person; then 7wks from now anatomy u/s and HROB .   Orders Placed This Encounter  Procedures   Urine Culture   GC/Chlamydia Probe Amp   Integrated 1   Protein / creatinine ratio, urine   CBC/D/Plt+RPR+Rh+ABO+RubIgG...   PANORAMA PRENATAL TEST   Glucose Tolerance, 2 Hours w/1 Hour    Ferd Householder CNM, [facility] 10/05/2023 10:55 AM

## 2023-10-07 DIAGNOSIS — Z3480 Encounter for supervision of other normal pregnancy, unspecified trimester: Secondary | ICD-10-CM | POA: Diagnosis not present

## 2023-10-07 LAB — GC/CHLAMYDIA PROBE AMP
Chlamydia trachomatis, NAA: NEGATIVE
Neisseria Gonorrhoeae by PCR: NEGATIVE

## 2023-10-08 ENCOUNTER — Other Ambulatory Visit

## 2023-10-08 DIAGNOSIS — Z6841 Body Mass Index (BMI) 40.0 and over, adult: Secondary | ICD-10-CM | POA: Diagnosis not present

## 2023-10-08 DIAGNOSIS — R7309 Other abnormal glucose: Secondary | ICD-10-CM | POA: Diagnosis not present

## 2023-10-08 DIAGNOSIS — Z131 Encounter for screening for diabetes mellitus: Secondary | ICD-10-CM | POA: Diagnosis not present

## 2023-10-08 DIAGNOSIS — O09299 Supervision of pregnancy with other poor reproductive or obstetric history, unspecified trimester: Secondary | ICD-10-CM | POA: Diagnosis not present

## 2023-10-08 DIAGNOSIS — Z3480 Encounter for supervision of other normal pregnancy, unspecified trimester: Secondary | ICD-10-CM | POA: Diagnosis not present

## 2023-10-08 DIAGNOSIS — Z3A13 13 weeks gestation of pregnancy: Secondary | ICD-10-CM | POA: Diagnosis not present

## 2023-10-09 LAB — INTEGRATED 1

## 2023-10-09 LAB — URINE CULTURE

## 2023-10-09 LAB — GLUCOSE TOLERANCE, 2 HOURS W/ 1HR
Glucose, 1 hour: 141 mg/dL (ref 70–179)
Glucose, 2 hour: 123 mg/dL (ref 70–152)
Glucose, Fasting: 79 mg/dL (ref 70–91)

## 2023-10-11 ENCOUNTER — Ambulatory Visit: Payer: Self-pay | Admitting: Women's Health

## 2023-10-11 DIAGNOSIS — R8271 Bacteriuria: Secondary | ICD-10-CM | POA: Insufficient documentation

## 2023-10-11 DIAGNOSIS — Z3480 Encounter for supervision of other normal pregnancy, unspecified trimester: Secondary | ICD-10-CM

## 2023-10-11 MED ORDER — SULFAMETHOXAZOLE-TRIMETHOPRIM 800-160 MG PO TABS: 1.0000 | ORAL_TABLET | Freq: Two times a day (BID) | ORAL | 0 refills | Status: DC

## 2023-10-11 MED ORDER — FERROUS SULFATE 325 (65 FE) MG PO TABS: 325.0000 mg | ORAL_TABLET | ORAL | 2 refills | Status: AC

## 2023-10-12 ENCOUNTER — Encounter: Payer: Self-pay | Admitting: Women's Health

## 2023-10-12 ENCOUNTER — Other Ambulatory Visit: Payer: Self-pay | Admitting: Women's Health

## 2023-10-12 LAB — CBC/D/PLT+RPR+RH+ABO+RUBIGG...
Antibody Screen: NEGATIVE
Basophils Absolute: 0.1 10*3/uL (ref 0.0–0.2)
Basos: 1 %
EOS (ABSOLUTE): 0.1 10*3/uL (ref 0.0–0.4)
Eos: 1 %
HCV Ab: NONREACTIVE
HIV Screen 4th Generation wRfx: NONREACTIVE
Hematocrit: 32.8 % — ABNORMAL LOW (ref 34.0–46.6)
Hemoglobin: 10.8 g/dL — ABNORMAL LOW (ref 11.1–15.9)
Hepatitis B Surface Ag: NEGATIVE
Immature Grans (Abs): 0 10*3/uL (ref 0.0–0.1)
Immature Granulocytes: 0 %
Lymphocytes Absolute: 2.5 10*3/uL (ref 0.7–3.1)
Lymphs: 26 %
MCH: 30 pg (ref 26.6–33.0)
MCHC: 32.9 g/dL (ref 31.5–35.7)
MCV: 91 fL (ref 79–97)
Monocytes Absolute: 0.6 10*3/uL (ref 0.1–0.9)
Monocytes: 6 %
Neutrophils Absolute: 6.4 10*3/uL (ref 1.4–7.0)
Neutrophils: 66 %
Platelets: 177 10*3/uL (ref 150–450)
RBC: 3.6 x10E6/uL — ABNORMAL LOW (ref 3.77–5.28)
RDW: 13.8 % (ref 11.7–15.4)
RPR Ser Ql: NONREACTIVE
Rh Factor: POSITIVE
Rubella Antibodies, IGG: 4.18 {index} (ref >0.99)
WBC: 9.8 10*3/uL (ref 3.4–10.8)

## 2023-10-12 LAB — INTEGRATED 1
Crown Rump Length: 74.8 mm
Gest. Age on Collection Date: 13.7 wk
PAPP-A Value: 1901.4 ng/mL
Race: 1
Sonographer ID#: 309760
Sonographer ID#: 37.8 a
Weight: 1.8 mm
Weight: 253 [lb_av]

## 2023-10-12 LAB — PROTEIN / CREATININE RATIO, URINE
Creatinine, Urine: 180.2 mg/dL
Protein, Ur: 34.6 mg/dL
Protein/Creat Ratio: 192 mg/g{creat} (ref 0–200)

## 2023-10-12 LAB — HCV INTERPRETATION

## 2023-10-14 LAB — PANORAMA PRENATAL TEST FULL PANEL:PANORAMA TEST PLUS 5 ADDITIONAL MICRODELETIONS: FETAL FRACTION: 8.6

## 2023-10-23 ENCOUNTER — Encounter: Payer: Self-pay | Admitting: Women's Health

## 2023-10-26 ENCOUNTER — Other Ambulatory Visit: Payer: Self-pay | Admitting: Women's Health

## 2023-10-26 DIAGNOSIS — R5383 Other fatigue: Secondary | ICD-10-CM | POA: Diagnosis not present

## 2023-10-26 DIAGNOSIS — O0992 Supervision of high risk pregnancy, unspecified, second trimester: Secondary | ICD-10-CM | POA: Diagnosis not present

## 2023-10-27 ENCOUNTER — Ambulatory Visit: Payer: Self-pay | Admitting: Women's Health

## 2023-10-27 LAB — CBC
Hematocrit: 30.6 % — ABNORMAL LOW (ref 34.0–46.6)
Hemoglobin: 10.1 g/dL — ABNORMAL LOW (ref 11.1–15.9)
MCH: 29.7 pg (ref 26.6–33.0)
MCHC: 33 g/dL (ref 31.5–35.7)
MCV: 90 fL (ref 79–97)
Platelets: 205 10*3/uL (ref 150–450)
RBC: 3.4 x10E6/uL — ABNORMAL LOW (ref 3.77–5.28)
RDW: 13.2 % (ref 11.7–15.4)
WBC: 12.6 10*3/uL — ABNORMAL HIGH (ref 3.4–10.8)

## 2023-11-02 ENCOUNTER — Ambulatory Visit: Admitting: Obstetrics & Gynecology

## 2023-11-02 ENCOUNTER — Encounter: Payer: Self-pay | Admitting: Obstetrics & Gynecology

## 2023-11-02 VITALS — BP 125/78 | HR 80 | Wt 250.6 lb

## 2023-11-02 DIAGNOSIS — Z87440 Personal history of urinary (tract) infections: Secondary | ICD-10-CM | POA: Diagnosis not present

## 2023-11-02 DIAGNOSIS — Z1379 Encounter for other screening for genetic and chromosomal anomalies: Secondary | ICD-10-CM | POA: Diagnosis not present

## 2023-11-02 DIAGNOSIS — Z3A17 17 weeks gestation of pregnancy: Secondary | ICD-10-CM

## 2023-11-02 DIAGNOSIS — Z3482 Encounter for supervision of other normal pregnancy, second trimester: Secondary | ICD-10-CM | POA: Diagnosis not present

## 2023-11-02 DIAGNOSIS — Z3480 Encounter for supervision of other normal pregnancy, unspecified trimester: Secondary | ICD-10-CM

## 2023-11-02 NOTE — Addendum Note (Signed)
 Addended by: NEYSA CLARITA RAMAN on: 11/02/2023 09:07 AM   Modules accepted: Orders

## 2023-11-02 NOTE — Progress Notes (Signed)
 HIGH-RISK PREGNANCY VISIT Patient name: Dominique Garza MRN [id number]  Date of birth: Aug 30, 1985 Chief Complaint:   Routine Prenatal Visit (2nd IT today!!! )  History of Present Illness:   Dominique Garza is a 38 y.o. [id number] female at [redacted]w[redacted]d with an Estimated Date of Delivery: 04/07/24 being seen today for ongoing management of a high-risk pregnancy complicated by  -Anemia -Obesity -UTI, tx'd, []  TOC today  Today she reports no complaints.   Contractions: Not present. Vag. Bleeding: None.  Movement: Present. denies leaking of fluid.      10/05/2023   10:16 AM 08/13/2023    1:16 PM 12/01/2021    2:41 PM 08/25/2021    3:19 PM 02/24/2021    9:50 AM  Depression screen PHQ 2/9  Decreased Interest 1 0 0 0 0  Down, Depressed, Hopeless 0 0 0 0 0  PHQ - 2 Score 1 0 0 0 0  Altered sleeping 1 1   0  Tired, decreased energy 1 1   0  Change in appetite 0 1   0  Feeling bad or failure about yourself  0 0   0  Trouble concentrating 0 0   0  Moving slowly or fidgety/restless 0 0   0  Suicidal thoughts 0 0   0  PHQ-9 Score 3 3   0  Difficult doing work/chores  Not difficult at all        Current Outpatient Medications  Medication Instructions   aspirin  EC 162 mg, Oral, Daily, Swallow whole.   Blood Pressure Monitor MISC For regular home bp monitoring during pregnancy   ferrous sulfate  325 mg, Oral, Every other day   Prenatal Vit-Fe Fumarate-FA (PRENATAL VITAMINS PLUS PO)    sulfamethoxazole -trimethoprim  (BACTRIM  DS) 800-160 MG tablet 1 tablet, Oral, 2 times daily, X 7 days   UNABLE TO FIND Iron blood builder-daily   UNABLE TO FIND Emergency Vit C-packet once or twice a week     Review of Systems:   Pertinent items are noted in HPI Denies abnormal vaginal discharge w/ itching/odor/irritation, headaches, visual changes, shortness of breath, chest pain, abdominal pain, severe nausea/vomiting, or problems with urination or bowel movements unless otherwise stated above. Pertinent History  Reviewed:  Reviewed past medical,surgical, social, obstetrical and family history.  Reviewed problem list, medications and allergies. Physical Assessment:   Vitals:   11/02/23 0854  BP: 125/78  Pulse: 80  Weight: 250 lb 9.6 oz (113.7 kg)  Body mass index is 43.02 kg/m.           Physical Examination:   General appearance: alert, well appearing, and in no distress  Mental status: normal mood, behavior, speech, dress, motor activity, and thought processes  Skin: warm & dry   Extremities: Edema: None    Cardiovascular: normal heart rate noted  Respiratory: normal respiratory effort, no distress  Abdomen: gravid, soft, non-tender  Pelvic: Cervical exam deferred         Fetal Status: Fetal Heart Rate (bpm): 135   Movement: Present    Fetal Surveillance Testing today: doppler   Chaperone: N/A    No results found for this or any previous visit (from the past 24 hours).   Assessment & Plan:  High-risk pregnancy: [id number] at [redacted]w[redacted]d with an Estimated Date of Delivery: 04/07/24   -Anemia doing well with supplement -Obesity -UTI, tx'd, []  TOC today  Meds: No orders of the defined types were placed in this encounter.   Labs/procedures today: IT2  Treatment Plan:  routine OB care, anatomy scan scheduled  Reviewed: Preterm labor symptoms and general obstetric precautions including but not limited to vaginal bleeding, contractions, leaking of fluid and fetal movement were reviewed in detail with the patient.  All questions were answered.   Follow-up: Return for 4wk as scheduled anatomy scan.   Future Appointments  Date Time Provider Department Center  11/23/2023 10:00 AM [facility] US  [facility] None  11/23/2023 10:50 AM Kizzie Suzen SAUNDERS, CNM [facility]    Orders Placed This Encounter  Procedures   INTEGRATED 2    Ledonna Dormer, DO Attending Obstetrician & Gynecologist, [facility] for [employer], [facility]

## 2023-11-04 ENCOUNTER — Ambulatory Visit: Payer: Self-pay | Admitting: Obstetrics & Gynecology

## 2023-11-04 LAB — INTEGRATED 2
AFP MoM: 1.38
Alpha-Fetoprotein: 36.5 ng/mL
Crown Rump Length: 74.8 mm
DIA MoM: 2.75
DIA Value: 324.4 pg/mL
Estriol, Unconjugated: 1.88 ng/mL
Gest. Age on Collection Date: 13.7 wk
Gestational Age: 17.3 wk
Maternal Age at EDD: 37.8 a
Nuchal Translucency (NT): 1.8 mm
Nuchal Translucency MoM: 0.87
Number of Fetuses: 1
PAPP-A MoM: 3.09
PAPP-A Value: 1901.4 ng/mL
Sonographer ID#: 309760
Test Results:: NEGATIVE
Weight: 251 [lb_av]
Weight: 253 [lb_av]
hCG MoM: 1.39
hCG Value: 27.5 [IU]/mL
uE3 MoM: 1.63

## 2023-11-04 LAB — URINE CULTURE

## 2023-11-10 ENCOUNTER — Encounter: Payer: Self-pay | Admitting: *Deleted

## 2023-11-10 ENCOUNTER — Encounter: Payer: Self-pay | Admitting: Obstetrics & Gynecology

## 2023-11-22 ENCOUNTER — Other Ambulatory Visit: Payer: Self-pay | Admitting: Obstetrics & Gynecology

## 2023-11-22 DIAGNOSIS — Z363 Encounter for antenatal screening for malformations: Secondary | ICD-10-CM

## 2023-11-23 ENCOUNTER — Other Ambulatory Visit (HOSPITAL_COMMUNITY)
Admission: RE | Admit: 2023-11-23 | Discharge: 2023-11-23 | Disposition: A | Discharge status: Home / Self Care | Source: Ambulatory Visit | Attending: Women's Health | Admitting: Women's Health

## 2023-11-23 ENCOUNTER — Ambulatory Visit (INDEPENDENT_AMBULATORY_CARE_PROVIDER_SITE_OTHER): Admitting: Women's Health

## 2023-11-23 ENCOUNTER — Other Ambulatory Visit

## 2023-11-23 ENCOUNTER — Encounter: Payer: Self-pay | Admitting: Women's Health

## 2023-11-23 VITALS — BP 111/75 | HR 80 | Wt 254.0 lb

## 2023-11-23 DIAGNOSIS — O0992 Supervision of high risk pregnancy, unspecified, second trimester: Secondary | ICD-10-CM | POA: Insufficient documentation

## 2023-11-23 DIAGNOSIS — Z362 Encounter for other antenatal screening follow-up: Secondary | ICD-10-CM

## 2023-11-23 DIAGNOSIS — O99212 Obesity complicating pregnancy, second trimester: Secondary | ICD-10-CM | POA: Diagnosis not present

## 2023-11-23 DIAGNOSIS — N898 Other specified noninflammatory disorders of vagina: Secondary | ICD-10-CM | POA: Insufficient documentation

## 2023-11-23 DIAGNOSIS — Z3A20 20 weeks gestation of pregnancy: Secondary | ICD-10-CM

## 2023-11-23 DIAGNOSIS — R8271 Bacteriuria: Secondary | ICD-10-CM

## 2023-11-23 DIAGNOSIS — Z363 Encounter for antenatal screening for malformations: Secondary | ICD-10-CM

## 2023-11-23 DIAGNOSIS — Z8759 Personal history of other complications of pregnancy, childbirth and the puerperium: Secondary | ICD-10-CM

## 2023-11-23 DIAGNOSIS — O0990 Supervision of high risk pregnancy, unspecified, unspecified trimester: Secondary | ICD-10-CM

## 2023-11-23 DIAGNOSIS — O99891 Other specified diseases and conditions complicating pregnancy: Secondary | ICD-10-CM

## 2023-11-23 DIAGNOSIS — Z6841 Body Mass Index (BMI) 40.0 and over, adult: Secondary | ICD-10-CM | POA: Insufficient documentation

## 2023-11-23 DIAGNOSIS — Z3480 Encounter for supervision of other normal pregnancy, unspecified trimester: Secondary | ICD-10-CM

## 2023-11-23 NOTE — Progress Notes (Signed)
 HIGH-RISK PREGNANCY VISIT Patient name: Dominique Garza MRN [id number]  Date of birth: 05-Jan-1986 Chief Complaint:   Routine Prenatal Visit and Pregnancy Ultrasound  History of Present Illness:   NEDDA GAINS is a 38 y.o. [id number] female at [redacted]w[redacted]d with an Estimated Date of Delivery: 04/07/24 being seen today for ongoing management of a high-risk pregnancy complicated by PG BMI 43.    Today she reports vaginal discharge and itching. Contractions: Not present.  .  Movement: Present. denies leaking of fluid.      10/05/2023   10:16 AM 08/13/2023    1:16 PM 12/01/2021    2:41 PM 08/25/2021    3:19 PM 02/24/2021    9:50 AM  Depression screen PHQ 2/9  Decreased Interest 1 0 0 0 0  Down, Depressed, Hopeless 0 0 0 0 0  PHQ - 2 Score 1 0 0 0 0  Altered sleeping 1 1   0  Tired, decreased energy 1 1   0  Change in appetite 0 1   0  Feeling bad or failure about yourself  0 0   0  Trouble concentrating 0 0   0  Moving slowly or fidgety/restless 0 0   0  Suicidal thoughts 0 0   0  PHQ-9 Score 3 3   0  Difficult doing work/chores  Not difficult at all           10/05/2023   10:16 AM 08/13/2023    1:17 PM 02/24/2021    9:55 AM  GAD 7 : Generalized Anxiety Score  Nervous, Anxious, on Edge 0 0 0  Control/stop worrying 0 0 0  Worry too much - different things 0 0 0  Trouble relaxing 0 0 0  Restless 0 0 0  Easily annoyed or irritable 0 0 0  Afraid - awful might happen 0 0 0  Total GAD 7 Score 0 0 0  Anxiety Difficulty  Not difficult at all      Review of Systems:   Pertinent items are noted in HPI Denies abnormal vaginal discharge w/ itching/odor/irritation, headaches, visual changes, shortness of breath, chest pain, abdominal pain, severe nausea/vomiting, or problems with urination or bowel movements unless otherwise stated above. Pertinent History Reviewed:  Reviewed past medical,surgical, social, obstetrical and family history.  Reviewed problem list, medications and allergies. Physical  Assessment:   Vitals:   11/23/23 1053  BP: 111/75  Pulse: 80  Weight: 254 lb (115.2 kg)  Body mass index is 43.6 kg/m.           Physical Examination:   General appearance: alert, well appearing, and in no distress  Mental status: alert, oriented to person, place, and time  Skin: warm & dry   Extremities: Edema: None    Cardiovascular: normal heart rate noted  Respiratory: normal respiratory effort, no distress  Abdomen: gravid, soft, non-tender  Pelvic: spec exam: cx visually closed, large amt thin and thick whtie d/c, CV swab obtained         Fetal Status:     Movement: Present    Fetal Surveillance Testing today: US  20+4 wks,cephalic,anterior placenta gr 0,normal ovaries,FHR 137 bpm,CX 2.8 cm,SVP of fluid 6 cm,EFW 375 g 54%,limited view of spine because of fetal position,please have pt come back for additional images,no obvious abnormalities   Chaperone: Peggy Dones  No results found for this or any previous visit (from the past 24 hours).  Assessment & Plan:  High-risk pregnancy: H3E6885 at [redacted]w[redacted]d with an  Estimated Date of Delivery: 04/07/24   1) PGBMI 43, currently 43  2) Vaginal itching/discharge, CV swab  3) H/O pre-e> ASA  4) H/O FGR x 3  Meds: No orders of the defined types were placed in this encounter.   Labs/procedures today: spec exam, CV swab, and U/S  Treatment Plan:  U/S 28, 32, 36wks   2x/wk nst or weekly BPP @ 34-36wks   Deliver @ 39-40.6wks   Reviewed: Preterm labor symptoms and general obstetric precautions including but not limited to vaginal bleeding, contractions, leaking of fluid and fetal movement were reviewed in detail with the patient.  All questions were answered. Does have home bp cuff. Office bp cuff given: not applicable. Check bp weekly, let us  know if consistently >140 and/or >90.  Follow-up: Return in about 4 weeks (around 12/21/2023) for HROB, US :OB F/U spine, MD or CNM, in person.   No future appointments.  Orders Placed This  Encounter  Procedures   US  OB Follow Up   Suzen JONELLE Fetters CNM, [facility] 11/23/2023 11:19 AM

## 2023-11-23 NOTE — Progress Notes (Signed)
 US  20+4 wks,cephalic,anterior placenta gr 0,normal ovaries,FHR 137 bpm,CX 2.8 cm,SVP of fluid 6 cm,EFW 375 g 54%,limited view of spine because of fetal position,please have pt come back for additional images,no obvious abnormalities

## 2023-11-23 NOTE — Patient Instructions (Signed)
 Vella, thank you for choosing our office today! We appreciate the opportunity to meet your healthcare needs. You may receive a short survey by mail, e-mail, or through [employer]. If you are happy with your care we would appreciate if you could take just a few minutes to complete the survey questions. We read all of your comments and take your feedback very seriously. Thank you again for choosing our office.  Center for [employer] Team at [facility] at [facility] ([address] [place], [place] [address]) Entrance C, located off of [address] Free 24/7 valet parking  Go to [email or web address] to register for FREE online childbirth classes  Call the office [phone]) or go to [facility] if: You begin to severe cramping Your water breaks.  Sometimes it is a big gush of fluid, sometimes it is just a trickle that keeps getting your panties wet or running down your legs You have vaginal bleeding.  It is normal to have a small amount of spotting if your cervix was checked.   [facility] Pediatricians/Family Doctors [facility]): [address] Luba BROCKS, [phone]           [facility]: [address], [phone]                [facility]): [address], [phone] (call to ask if accepting patients) [facility] Department: [address], [place], [phone]    [facility] Pediatricians/Family Doctors [facility]): [phone], [phone] [facility]: [address] [place], [phone] [facility]: [address], [phone]  [facility] Doctors  [facility]): [phone] [facility]: [address], [phone]   [facility] Doctors [facility]: [address], [phone]  [facility] Doctors  [facility]: [phone], [phone]  Home Blood Pressure Monitoring for Patients   Your provider has recommended that you check your blood pressure (BP) at least once a week at home. If you do not have a blood pressure cuff at home, one will be provided for you. Contact your provider if you have not received your monitor within 1 week.   Helpful Tips for Accurate Home Blood Pressure Checks  Don't smoke, exercise, or drink caffeine 30 minutes before checking your BP Use the restroom before checking your BP (a full bladder can raise your pressure) Relax in a comfortable upright chair Feet on the ground Left arm resting comfortably on a flat surface at the level of your heart Legs uncrossed Back supported Sit quietly and don't talk Place the cuff on your bare arm Adjust snuggly, so that only two fingertips can fit between your skin and the top of the cuff Check 2 readings separated by at least one minute Keep a log of your BP readings For a visual, please reference this diagram: http://ccnc.care/bpdiagram  Provider Name: [facility]     Phone: [phone]  Zone 1: ALL CLEAR  Continue to monitor your symptoms:  BP reading is less than 140 (top number) or less than 90 (bottom number)  No right upper stomach pain No headaches or seeing spots No feeling nauseated or throwing up No swelling in face and hands  Zone 2: CAUTION Call your doctor's office for any of the following:  BP reading is greater than 140 (top number) or greater than  90 (bottom number)  Stomach pain under your ribs in the middle or right side Headaches or seeing spots Feeling nauseated or throwing up Swelling in face and hands  Zone 3: EMERGENCY  Seek immediate medical care if you have any of the following:  BP reading is greater than160 (top number) or greater than 110 (bottom number) Severe headaches not improving with Tylenol  Serious difficulty catching your breath Any worsening symptoms from  Zone 2     Second Trimester of Pregnancy The second trimester is from week 14 through week 27 (months 4 through 6). The second trimester is often a time when you feel your best. Your body has adjusted to being pregnant, and you begin to feel better physically. Usually, morning sickness has lessened or quit completely, you may have more energy, and you may have an increase in appetite. The second trimester is also a time when the fetus is growing rapidly. At the end of the sixth month, the fetus is about 9 inches long and weighs about 1 pounds. You will likely begin to feel the baby move (quickening) between 16 and 20 weeks of pregnancy. Body changes during your second trimester Your body continues to go through many changes during your second trimester. The changes vary from woman to woman. Your weight will continue to increase. You will notice your lower abdomen bulging out. You may begin to get stretch marks on your hips, abdomen, and breasts. You may develop headaches that can be relieved by medicines. The medicines should be approved by your health care provider. You may urinate more often because the fetus is pressing on your bladder. You may develop or continue to have heartburn as a result of your pregnancy. You may develop constipation because certain hormones are causing the muscles that push waste through your intestines to slow down. You may develop hemorrhoids or swollen, bulging veins (varicose veins). You may have back pain. This is caused by: Weight gain. Pregnancy hormones that are relaxing the joints in your pelvis. A shift in weight and the muscles that support your balance. Your breasts will continue to grow and they will continue to become tender. Your gums may bleed and may be sensitive to brushing and flossing. Dark spots or blotches (chloasma, mask of pregnancy) may develop on your face. This will likely fade after the baby is born. A dark line from your belly button to  the pubic area (linea nigra) may appear. This will likely fade after the baby is born. You may have changes in your hair. These can include thickening of your hair, rapid growth, and changes in texture. Some women also have hair loss during or after pregnancy, or hair that feels dry or thin. Your hair will most likely return to normal after your baby is born.  What to expect at prenatal visits During a routine prenatal visit: You will be weighed to make sure you and the fetus are growing normally. Your blood pressure will be taken. Your abdomen will be measured to track your baby's growth. The fetal heartbeat will be listened to. Any test results from the previous visit will be discussed.  Your health care provider may ask you: How you are feeling. If you are feeling the baby move. If you have had any abnormal symptoms, such as leaking fluid, bleeding, severe headaches, or abdominal cramping. If you are using any tobacco products, including cigarettes, chewing tobacco, and electronic cigarettes. If you have any questions.  Other tests that may be performed during  your second trimester include: Blood tests that check for: Low iron levels (anemia). High blood sugar that affects pregnant women (gestational diabetes) between 53 and 28 weeks. Rh antibodies. This is to check for a protein on red blood cells (Rh factor). Urine tests to check for infections, diabetes, or protein in the urine. An ultrasound to confirm the proper growth and development of the baby. An amniocentesis to check for possible genetic problems. Fetal screens for spina bifida and Down syndrome. HIV (human immunodeficiency virus) testing. Routine prenatal testing includes screening for HIV, unless you choose not to have this test.  Follow these instructions at home: Medicines Follow your health care provider's instructions regarding medicine use. Specific medicines may be either safe or unsafe to take during  pregnancy. Take a prenatal vitamin that contains at least 600 micrograms (mcg) of folic acid. If you develop constipation, try taking a stool softener if your health care provider approves. Eating and drinking Eat a balanced diet that includes fresh fruits and vegetables, whole grains, good sources of protein such as meat, eggs, or tofu, and low-fat dairy. Your health care provider will help you determine the amount of weight gain that is right for you. Avoid raw meat and uncooked cheese. These carry germs that can cause birth defects in the baby. If you have low calcium intake from food, talk to your health care provider about whether you should take a daily calcium supplement. Limit foods that are high in fat and processed sugars, such as fried and sweet foods. To prevent constipation: Drink enough fluid to keep your urine clear or pale yellow. Eat foods that are high in fiber, such as fresh fruits and vegetables, whole grains, and beans. Activity Exercise only as directed by your health care provider. Most women can continue their usual exercise routine during pregnancy. Try to exercise for 30 minutes at least 5 days a week. Stop exercising if you experience uterine contractions. Avoid heavy lifting, wear low heel shoes, and practice good posture. A sexual relationship may be continued unless your health care provider directs you otherwise. Relieving pain and discomfort Wear a good support bra to prevent discomfort from breast tenderness. Take warm sitz baths to soothe any pain or discomfort caused by hemorrhoids. Use hemorrhoid cream if your health care provider approves. Rest with your legs elevated if you have leg cramps or low back pain. If you develop varicose veins, wear support hose. Elevate your feet for 15 minutes, 3-4 times a day. Limit salt in your diet. Prenatal Care Write down your questions. Take them to your prenatal visits. Keep all your prenatal visits as told by your health  care provider. This is important. Safety Wear your seat belt at all times when driving. Make a list of emergency phone numbers, including numbers for family, friends, the hospital, and police and fire departments. General instructions Ask your health care provider for a referral to a local prenatal education class. Begin classes no later than the beginning of month 6 of your pregnancy. Ask for help if you have counseling or nutritional needs during pregnancy. Your health care provider can offer advice or refer you to specialists for help with various needs. Do not use hot tubs, steam rooms, or saunas. Do not douche or use tampons or scented sanitary pads. Do not cross your legs for long periods of time. Avoid cat litter boxes and soil used by cats. These carry germs that can cause birth defects in the baby and possibly loss of the  fetus by miscarriage or stillbirth. Avoid all smoking, herbs, alcohol, and unprescribed drugs. Chemicals in these products can affect the formation and growth of the baby. Do not use any products that contain nicotine or tobacco, such as cigarettes and e-cigarettes. If you need help quitting, ask your health care provider. Visit your dentist if you have not gone yet during your pregnancy. Use a soft toothbrush to brush your teeth and be gentle when you floss. Contact a health care provider if: You have dizziness. You have mild pelvic cramps, pelvic pressure, or nagging pain in the abdominal area. You have persistent nausea, vomiting, or diarrhea. You have a bad smelling vaginal discharge. You have pain when you urinate. Get help right away if: You have a fever. You are leaking fluid from your vagina. You have spotting or bleeding from your vagina. You have severe abdominal cramping or pain. You have rapid weight gain or weight loss. You have shortness of breath with chest pain. You notice sudden or extreme swelling of your face, hands, ankles, feet, or legs. You  have not felt your baby move in over an hour. You have severe headaches that do not go away when you take medicine. You have vision changes. Summary The second trimester is from week 14 through week 27 (months 4 through 6). It is also a time when the fetus is growing rapidly. Your body goes through many changes during pregnancy. The changes vary from woman to woman. Avoid all smoking, herbs, alcohol, and unprescribed drugs. These chemicals affect the formation and growth your baby. Do not use any tobacco products, such as cigarettes, chewing tobacco, and e-cigarettes. If you need help quitting, ask your health care provider. Contact your health care provider if you have any questions. Keep all prenatal visits as told by your health care provider. This is important. This information is not intended to replace advice given to you by your health care provider. Make sure you discuss any questions you have with your health care provider. Document Released: 04/21/2001 Document Revised: 10/03/2015 Document Reviewed: 06/28/2012 Elsevier Interactive Patient Education  2017 [employer].

## 2023-11-24 ENCOUNTER — Other Ambulatory Visit

## 2023-11-25 ENCOUNTER — Ambulatory Visit: Payer: Self-pay | Admitting: Women's Health

## 2023-11-25 LAB — CERVICOVAGINAL ANCILLARY ONLY
Bacterial Vaginitis (gardnerella): POSITIVE — AB
Candida Glabrata: NEGATIVE
Candida Vaginitis: POSITIVE — AB
Chlamydia: NEGATIVE
Comment: NEGATIVE
Comment: NEGATIVE
Comment: NEGATIVE
Comment: NEGATIVE
Comment: NEGATIVE
Comment: NORMAL
Neisseria Gonorrhea: NEGATIVE
Trichomonas: NEGATIVE

## 2023-11-25 MED ORDER — TERCONAZOLE 0.4 % VA CREA: 1.0000 | TOPICAL_CREAM | Freq: Every day | VAGINAL | 0 refills | Status: DC

## 2023-11-25 MED ORDER — METRONIDAZOLE 500 MG PO TABS: 500.0000 mg | ORAL_TABLET | Freq: Two times a day (BID) | ORAL | 0 refills | Status: DC

## 2023-12-24 ENCOUNTER — Other Ambulatory Visit: Payer: Self-pay | Admitting: Advanced Practice Midwife

## 2023-12-24 ENCOUNTER — Telehealth: Payer: Self-pay | Admitting: Advanced Practice Midwife

## 2023-12-24 ENCOUNTER — Encounter (HOSPITAL_COMMUNITY): Payer: Self-pay

## 2023-12-24 ENCOUNTER — Encounter: Payer: Self-pay | Admitting: Advanced Practice Midwife

## 2023-12-24 ENCOUNTER — Inpatient Hospital Stay (HOSPITAL_COMMUNITY)
Admission: EM | Admit: 2023-12-24 | Discharge: 2023-12-24 | Disposition: A | Discharge status: Home / Self Care | Attending: Obstetrics & Gynecology | Admitting: Obstetrics & Gynecology

## 2023-12-24 ENCOUNTER — Other Ambulatory Visit: Payer: Self-pay

## 2023-12-24 DIAGNOSIS — Z3A25 25 weeks gestation of pregnancy: Secondary | ICD-10-CM

## 2023-12-24 DIAGNOSIS — O26892 Other specified pregnancy related conditions, second trimester: Secondary | ICD-10-CM

## 2023-12-24 DIAGNOSIS — B3731 Acute candidiasis of vulva and vagina: Secondary | ICD-10-CM

## 2023-12-24 DIAGNOSIS — R109 Unspecified abdominal pain: Secondary | ICD-10-CM

## 2023-12-24 DIAGNOSIS — N942 Vaginismus: Secondary | ICD-10-CM

## 2023-12-24 DIAGNOSIS — M549 Dorsalgia, unspecified: Secondary | ICD-10-CM | POA: Diagnosis not present

## 2023-12-24 HISTORY — DX: Anemia, unspecified: D64.9

## 2023-12-24 HISTORY — DX: Urinary tract infection, site not specified: N39.0

## 2023-12-24 HISTORY — DX: Unspecified ovarian cyst, unspecified side: N83.209

## 2023-12-24 LAB — URINALYSIS, ROUTINE W REFLEX MICROSCOPIC
Bilirubin Urine: NEGATIVE
Glucose, UA: NEGATIVE mg/dL
Hgb urine dipstick: NEGATIVE
Ketones, ur: NEGATIVE mg/dL
Nitrite: NEGATIVE
Protein, ur: NEGATIVE mg/dL
Specific Gravity, Urine: 1.014 (ref 1.005–1.030)
pH: 6 (ref 5.0–8.0)

## 2023-12-24 LAB — CBC WITH DIFFERENTIAL/PLATELET
Abs Immature Granulocytes: 0.18 K/uL — ABNORMAL HIGH (ref 0.00–0.07)
Basophils Absolute: 0.1 K/uL (ref 0.0–0.1)
Basophils Relative: 0 %
Eosinophils Absolute: 0.2 K/uL (ref 0.0–0.5)
Eosinophils Relative: 1 %
HCT: 30.5 % — ABNORMAL LOW (ref 36.0–46.0)
Hemoglobin: 10.5 g/dL — ABNORMAL LOW (ref 12.0–15.0)
Immature Granulocytes: 1 %
Lymphocytes Relative: 22 %
Lymphs Abs: 3.2 K/uL (ref 0.7–4.0)
MCH: 31.3 pg (ref 26.0–34.0)
MCHC: 34.4 g/dL (ref 30.0–36.0)
MCV: 91 fL (ref 80.0–100.0)
Monocytes Absolute: 0.9 K/uL (ref 0.1–1.0)
Monocytes Relative: 6 %
Neutro Abs: 9.8 K/uL — ABNORMAL HIGH (ref 1.7–7.7)
Neutrophils Relative %: 70 %
Platelets: 199 K/uL (ref 150–400)
RBC: 3.35 MIL/uL — ABNORMAL LOW (ref 3.87–5.11)
RDW: 13.5 % (ref 11.5–15.5)
WBC: 14.3 K/uL — ABNORMAL HIGH (ref 4.0–10.5)
nRBC: 0 % (ref 0.0–0.2)

## 2023-12-24 LAB — WET PREP, GENITAL
Clue Cells Wet Prep HPF POC: NONE SEEN
Sperm: NONE SEEN
Trich, Wet Prep: NONE SEEN
WBC, Wet Prep HPF POC: <10 (ref <10)
Yeast Wet Prep HPF POC: NONE SEEN

## 2023-12-24 MED ORDER — FLUCONAZOLE 150 MG PO TABS: 150.0000 mg | ORAL_TABLET | Freq: Once | ORAL | 0 refills | Status: AC

## 2023-12-24 NOTE — Telephone Encounter (Signed)
 Called patient to discuss yeast on urinalysis. Left message for pt to return call to [facility] regarding results.

## 2023-12-24 NOTE — ED Triage Notes (Signed)
 5th pregnancy.  25 weeks. Abdominal pain x1 hour Might be contractions , doesn't know 2 pregnancies premies Preeclampsia

## 2023-12-24 NOTE — ED Notes (Signed)
 Speculum exam attempted by EDP pH paper result of 6

## 2023-12-24 NOTE — ED Notes (Signed)
 Per OB RR-RN  Transfer to [facility] accepting

## 2023-12-24 NOTE — [facility] Note (Signed)
 Dominique Garza is a 38 y.o. at [redacted]w[redacted]d here in [facility] reporting: 2 days ago started  hurting, feeling torched, never had back problems before. 0300 woke up her stomach was hurting, was rock hard, felt like a brick.  Finally eased off.  Threw up when she got to the hospital.had a gush when she threw up, don't nobody know what it was. Abd pain, comes and goes, when it comes, it gets hard, balls up and gets tight.  States is tender.  Pain in lower back, reminds of when she was in labor.  Always has back labor.  Denies bleeding.  Denies further leaking. Reports +FM    Onset of complaint: 2 days ago Pain score: abd5, back more that a 10 Vitals:   12/24/23 0600 12/24/23 0618  BP: 123/74   Pulse: 89 91  Resp: (!) 25   Temp:    SpO2: 100% 99%     FHT:144 Lab orders placed from triage:

## 2023-12-24 NOTE — Progress Notes (Signed)
 Pt treated with Terazol for yeast but unsure if medication inserted well for treatment given significant vaginismus.  Benefits of one dose of Diflucan  far outweigh any pregnancy risks, as pt very uncomfortable with exams and tenderness from yeast may be contributing. Rx sent for Diflucan  150 mg PO x 1.  Called pt and left message but pt did not return call.  MyChart message sent.

## 2023-12-24 NOTE — ED Provider Notes (Signed)
 [facility] AT [facility] Provider Note   CSN: [id number] Arrival date & time: 12/24/23  9496     Patient presents with: Abdominal Pain (25 weeks)   Dominique Garza is a 38 y.o. female.   Patient presents to the emergency department for evaluation of back pain and abdominal pain.  Patient reports that she is [redacted] weeks pregnant.  She reports that her back is been hurting for a couple of days.  At 330 she woke up and she was having sharp pains into her abdomen.  While in the waiting room she noticed that she was wet, not sure if it was urine or amniotic fluid.       Prior to Admission medications   Medication Sig Start Date End Date Taking? Authorizing Provider  aspirin  EC 81 MG tablet Take 2 tablets (162 mg total) by mouth daily. Swallow whole. 10/05/23   Kizzie Suzen SAUNDERS, CNM  Blood Pressure Monitor MISC For regular home bp monitoring during pregnancy 10/05/23   Kizzie Suzen SAUNDERS, CNM  ferrous sulfate  325 (65 FE) MG tablet Take 1 tablet (325 mg total) by mouth every other day. 10/11/23   Kizzie Suzen SAUNDERS, CNM  metroNIDAZOLE  (FLAGYL ) 500 MG tablet Take 1 tablet (500 mg total) by mouth 2 (two) times daily. 11/25/23   Kizzie Suzen SAUNDERS, CNM  Prenatal Vit-Fe Fumarate-FA (PRENATAL VITAMINS PLUS PO)  08/11/23   [provider]  terconazole  (TERAZOL 7 ) 0.4 % vaginal cream Place 1 applicator vaginally at bedtime. X 7 nights 11/25/23   Kizzie Suzen SAUNDERS, CNM  UNABLE TO FIND Iron blood builder-daily    [provider]  UNABLE TO FIND Emergency Vit C-packet once or twice a week    [provider]    Allergies: Patient has no known allergies.    Review of Systems  Updated Vital Signs BP 123/74   Pulse 89   Temp 98.1 F (36.7 C)   Resp (!) 25   Ht 5' 4 (1.626 m)   Wt 115.2 kg   SpO2 100%   BMI 43.59 kg/m   Physical Exam Vitals and nursing note reviewed. Exam conducted with a chaperone present.  Constitutional:      General:  She is in acute distress.     Appearance: She is well-developed.  HENT:     Head: Normocephalic and atraumatic.     Mouth/Throat:     Mouth: Mucous membranes are moist.  Eyes:     General: Vision grossly intact. Gaze aligned appropriately.     Extraocular Movements: Extraocular movements intact.     Conjunctiva/sclera: Conjunctivae normal.  Cardiovascular:     Rate and Rhythm: Normal rate and regular rhythm.     Pulses: Normal pulses.     Heart sounds: Normal heart sounds, S1 normal and S2 normal. No murmur heard.    No friction rub. No gallop.  Pulmonary:     Effort: Pulmonary effort is normal. No respiratory distress.     Breath sounds: Normal breath sounds.  Abdominal:     General: Bowel sounds are normal.     Palpations: Abdomen is soft.     Tenderness: There is no guarding or rebound.     Hernia: No hernia is present.  Genitourinary:    Vagina: Vaginal discharge present.     Uterus: Enlarged.   Musculoskeletal:        General: No swelling.     Cervical back: Full passive range of motion without pain, normal  range of motion and neck supple. No spinous process tenderness or muscular tenderness. Normal range of motion.     Right lower leg: No edema.     Left lower leg: No edema.  Skin:    General: Skin is warm and dry.     Capillary Refill: Capillary refill takes less than 2 seconds.     Findings: No ecchymosis, erythema, rash or wound.  Neurological:     General: No focal deficit present.     Mental Status: She is alert and oriented to person, place, and time.     GCS: GCS eye subscore is 4. GCS verbal subscore is 5. GCS motor subscore is 6.     Cranial Nerves: Cranial nerves 2-12 are intact.     Sensory: Sensation is intact.     Motor: Motor function is intact.     Coordination: Coordination is intact.  Psychiatric:        Attention and Perception: Attention normal.        Mood and Affect: Mood normal.        Speech: Speech normal.        Behavior: Behavior normal.      (all labs ordered are listed, but only abnormal results are displayed) Labs Reviewed  CBC WITH DIFFERENTIAL/PLATELET - Abnormal; Notable for the following components:      Result Value   WBC 14.3 (*)    RBC 3.35 (*)    Hemoglobin 10.5 (*)    HCT 30.5 (*)    Neutro Abs 9.8 (*)    Abs Immature Granulocytes 0.18 (*)    All other components within normal limits  COMPREHENSIVE METABOLIC PANEL WITH GFR  URINALYSIS, ROUTINE W REFLEX MICROSCOPIC  POCT NITRAZINE TEST    EKG: None  Radiology: No results found.   Procedures   Medications Ordered in the ED - No data to display                                  Medical Decision Making Amount and/or Complexity of Data Reviewed Labs: ordered.   [redacted] weeks pregnant, G5 P4.  Patient reports she has been having back pain for a couple of days, back pain has intensified and now she has having intermittent pains in the abdomen.  She is not sure if it feels like contractions.  There is concern for amniotic fluid leak in the triage area.  Bimanual examination was difficult, patient had great deal of difficulty with the exam.  I did attempt a sterile speculum exam as well but patient could not tolerate.  I do not feel any significant dilation of the cervix on my attempt at exam.  No nitrazine paper, pH paper resulted in a pH of approximately 6.  Patient placed on monitor, fetal heart rate Constant around 140s.  Contacted MAU, patient will be transferred to the MAU for further evaluation.  Dr. Eveline accepting.     Final diagnoses:  Abdominal pain in pregnancy, second trimester    ED Discharge Orders     None          Haze Lonni PARAS, MD 12/24/23 [phone]
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
## 2023-12-24 NOTE — Progress Notes (Signed)
 9452BETHA BOYDEN called for pt G6P4 at 25wks with c/o abdominal pain 10/10. Pt placed on monitor.   9450: Dr. Eveline called and notified of pt G6P4 at 25wks with c/o abdominal pain 10/10. Pt on monitor unable to determine if she is having ctx at this time. FHR 130bpm/ mod variability. Orders for speculum exam.  0552: [facility] RN called and notified of orders for speculum exam. ED MD attempting at this time but pt unable to tolerate. Litmus paper showed fluid with ph of 6. Pt also now states she didn't know if her water broke or she peed herself. RN states there was obvious fluid of some sort when she got pt from lobby.  0600: Dr. Eveline called and notified of pt unable to tolerate speculum exam. Pt also now states she didn't know if her water broke or she peed herself. RN states there was obvious fluid of some sort when she got pt from lobby. UI on monitor and potential ctx. Orders to transfer to MAU.   0601: MAU Charge called and notified of incoming transfer.  9381: Pt removed from monitor for transfer.

## 2023-12-27 ENCOUNTER — Ambulatory Visit (INDEPENDENT_AMBULATORY_CARE_PROVIDER_SITE_OTHER): Admitting: Obstetrics & Gynecology

## 2023-12-27 ENCOUNTER — Ambulatory Visit (INDEPENDENT_AMBULATORY_CARE_PROVIDER_SITE_OTHER)

## 2023-12-27 VITALS — BP 114/76 | HR 82 | Wt 253.2 lb

## 2023-12-27 DIAGNOSIS — E669 Obesity, unspecified: Secondary | ICD-10-CM | POA: Diagnosis not present

## 2023-12-27 DIAGNOSIS — Z3A25 25 weeks gestation of pregnancy: Secondary | ICD-10-CM

## 2023-12-27 DIAGNOSIS — O99891 Other specified diseases and conditions complicating pregnancy: Secondary | ICD-10-CM

## 2023-12-27 DIAGNOSIS — Z362 Encounter for other antenatal screening follow-up: Secondary | ICD-10-CM | POA: Diagnosis not present

## 2023-12-27 DIAGNOSIS — Z3480 Encounter for supervision of other normal pregnancy, unspecified trimester: Secondary | ICD-10-CM

## 2023-12-27 DIAGNOSIS — O0990 Supervision of high risk pregnancy, unspecified, unspecified trimester: Secondary | ICD-10-CM

## 2023-12-27 DIAGNOSIS — N942 Vaginismus: Secondary | ICD-10-CM | POA: Diagnosis not present

## 2023-12-27 DIAGNOSIS — O0992 Supervision of high risk pregnancy, unspecified, second trimester: Secondary | ICD-10-CM

## 2023-12-27 DIAGNOSIS — O99212 Obesity complicating pregnancy, second trimester: Secondary | ICD-10-CM | POA: Diagnosis not present

## 2023-12-27 DIAGNOSIS — Z8759 Personal history of other complications of pregnancy, childbirth and the puerperium: Secondary | ICD-10-CM

## 2023-12-27 DIAGNOSIS — Z6841 Body Mass Index (BMI) 40.0 and over, adult: Secondary | ICD-10-CM

## 2023-12-27 NOTE — Progress Notes (Signed)
 LOW-RISK PREGNANCY VISIT Patient name: Dominique Garza MRN [id number]  Date of birth: [birth year] Chief Complaint:   Routine Prenatal Visit  History of Present Illness:   Dominique Garza is a 38 y.o. H3E6885 female at [redacted]w[redacted]d with an Estimated Date of Delivery: 04/07/24 being seen today for ongoing management of a low-risk pregnancy.   -Obesity      10/05/2023   10:16 AM 08/13/2023    1:16 PM 12/01/2021    2:41 PM 08/25/2021    3:19 PM 02/24/2021    9:50 AM  Depression screen PHQ 2/9  Decreased Interest 1 0 0 0 0  Down, Depressed, Hopeless 0 0 0 0 0  PHQ - 2 Score 1 0 0 0 0  Altered sleeping 1 1   0  Tired, decreased energy 1 1   0  Change in appetite 0 1   0  Feeling bad or failure about yourself  0 0   0  Trouble concentrating 0 0   0  Moving slowly or fidgety/restless 0 0   0  Suicidal thoughts 0 0   0  PHQ-9 Score 3 3   0  Difficult doing work/chores  Not difficult at all       Today she reports no complaints. Contractions: Irritability. Vag. Bleeding: None.  Movement: Present. denies leaking of fluid. Review of Systems:   Pertinent items are noted in HPI Denies abnormal vaginal discharge w/ itching/odor/irritation, headaches, visual changes, shortness of breath, chest pain, abdominal pain, severe nausea/vomiting, or problems with urination or bowel movements unless otherwise stated above. Pertinent History Reviewed:  Reviewed past medical,surgical, social, obstetrical and family history.  Reviewed problem list, medications and allergies.  Physical Assessment:   Vitals:   12/27/23 1133  BP: 114/76  Pulse: 82  Weight: 253 lb 3.2 oz (114.9 kg)  Body mass index is 43.46 kg/m.        Physical Examination:   General appearance: Well appearing, and in no distress  Mental status: Alert, oriented to person, place, and time  Skin: Warm & dry  Respiratory: Normal respiratory effort, no distress  Abdomen: Soft, gravid, nontender  Pelvic: Cervical exam deferred          Extremities:  no edema  Psych:  mood and affect appropriate  Fetal Status:     Movement: Present  cephalic,anterior placenta gr 1,cx 4.4 cm,AFI 23 cm,normal ovaries,FHR 137 bpm,anatomy of the spine complete,no obvious abnormalities,EFW 779 g 30%  Chaperone: n/a    No results found for this or any previous visit (from the past 24 hours).   Assessment & Plan:  1) Low-risk pregnancy H3E6885 at [redacted]w[redacted]d with an Estimated Date of Delivery: 04/07/24   -Vaginismus - Briefly discussed her concerns regarding vaginismus and will plan to address postpartum - For now recommendation for pelvic floor exercises and regular stretching  -Obesity  OB care - Normal anatomy seen, AGA -Routine OB care   Meds: No orders of the defined types were placed in this encounter.  Labs/procedures today: growth scan  Plan:  Continue routine obstetrical care , PN 2 before next visit Next visit: prefers in person    Reviewed: Preterm labor symptoms and general obstetric precautions including but not limited to vaginal bleeding, contractions, leaking of fluid and fetal movement were reviewed in detail with the patient.  All questions were answered.   Follow-up: Return for please schedule PN2 next week, next OB visit in 4wks.  No orders of the defined types were placed  in this encounter.   Zykera Abella, DO Attending Obstetrician & Gynecologist, [facility] for [employer], [facility]

## 2023-12-27 NOTE — Progress Notes (Signed)
 US  25+3 wks,cephalic,anterior placenta gr 1,cx 4.4 cm,AFI 23 cm,normal ovaries,FHR 137 bpm,anatomy of the spine complete,no obvious abnormalities,EFW 779 g 30%

## 2024-01-03 ENCOUNTER — Other Ambulatory Visit

## 2024-01-03 DIAGNOSIS — Z3A26 26 weeks gestation of pregnancy: Secondary | ICD-10-CM | POA: Diagnosis not present

## 2024-01-03 DIAGNOSIS — Z131 Encounter for screening for diabetes mellitus: Secondary | ICD-10-CM | POA: Diagnosis not present

## 2024-01-03 DIAGNOSIS — Z3480 Encounter for supervision of other normal pregnancy, unspecified trimester: Secondary | ICD-10-CM | POA: Diagnosis not present

## 2024-01-04 LAB — RPR: RPR Ser Ql: NONREACTIVE

## 2024-01-04 LAB — CBC
Hematocrit: 27.9 % — ABNORMAL LOW (ref 34.0–46.6)
Hemoglobin: 9.4 g/dL — ABNORMAL LOW (ref 11.1–15.9)
MCH: 31.6 pg (ref 26.6–33.0)
MCHC: 33.7 g/dL (ref 31.5–35.7)
MCV: 94 fL (ref 79–97)
Platelets: 180 x10E3/uL (ref 150–450)
RBC: 2.97 x10E6/uL — ABNORMAL LOW (ref 3.77–5.28)
RDW: 13.2 % (ref 11.7–15.4)
WBC: 10.5 x10E3/uL (ref 3.4–10.8)

## 2024-01-04 LAB — GLUCOSE TOLERANCE, 2 HOURS W/ 1HR
Glucose, 1 hour: 137 mg/dL (ref 70–179)
Glucose, 2 hour: 110 mg/dL (ref 70–152)
Glucose, Fasting: 79 mg/dL (ref 70–91)

## 2024-01-04 LAB — HIV ANTIBODY (ROUTINE TESTING W REFLEX): HIV Screen 4th Generation wRfx: NONREACTIVE

## 2024-01-04 LAB — ANTIBODY SCREEN: Antibody Screen: NEGATIVE

## 2024-01-06 ENCOUNTER — Encounter: Payer: Self-pay | Admitting: Advanced Practice Midwife

## 2024-01-06 ENCOUNTER — Ambulatory Visit: Payer: Self-pay | Admitting: Advanced Practice Midwife

## 2024-01-06 DIAGNOSIS — O99013 Anemia complicating pregnancy, third trimester: Secondary | ICD-10-CM | POA: Insufficient documentation

## 2024-01-24 ENCOUNTER — Encounter: Payer: Self-pay | Admitting: Advanced Practice Midwife

## 2024-01-24 ENCOUNTER — Ambulatory Visit (INDEPENDENT_AMBULATORY_CARE_PROVIDER_SITE_OTHER): Admitting: Advanced Practice Midwife

## 2024-01-24 VITALS — BP 115/71 | HR 87 | Wt 253.0 lb

## 2024-01-24 DIAGNOSIS — O0993 Supervision of high risk pregnancy, unspecified, third trimester: Secondary | ICD-10-CM | POA: Diagnosis not present

## 2024-01-24 DIAGNOSIS — O99213 Obesity complicating pregnancy, third trimester: Secondary | ICD-10-CM | POA: Diagnosis not present

## 2024-01-24 DIAGNOSIS — Z23 Encounter for immunization: Secondary | ICD-10-CM | POA: Diagnosis not present

## 2024-01-24 DIAGNOSIS — Z3A29 29 weeks gestation of pregnancy: Secondary | ICD-10-CM

## 2024-01-24 DIAGNOSIS — O0990 Supervision of high risk pregnancy, unspecified, unspecified trimester: Secondary | ICD-10-CM

## 2024-01-24 DIAGNOSIS — O99013 Anemia complicating pregnancy, third trimester: Secondary | ICD-10-CM

## 2024-01-24 DIAGNOSIS — Z8759 Personal history of other complications of pregnancy, childbirth and the puerperium: Secondary | ICD-10-CM | POA: Diagnosis not present

## 2024-01-24 DIAGNOSIS — Z6841 Body Mass Index (BMI) 40.0 and over, adult: Secondary | ICD-10-CM | POA: Diagnosis not present

## 2024-01-24 DIAGNOSIS — O99210 Obesity complicating pregnancy, unspecified trimester: Secondary | ICD-10-CM

## 2024-01-24 NOTE — Progress Notes (Addendum)
 HIGH-RISK PREGNANCY VISIT Patient name: Dominique Garza MRN [id number]  Date of birth: Jun 24, 1985 Chief Complaint:   Routine Prenatal Visit (Tdap)  History of Present Illness:   Dominique Garza is a 38 y.o. H3E6885 female at [redacted]w[redacted]d with an Estimated Date of Delivery: 04/07/24 being seen today for ongoing management of a high-risk pregnancy complicated by morbid obesity BMI >=40.    Today she reports eats all the time normal pregnancy complaints. Contractions: Irregular.  .  Movement: Present. denies leaking of fluid.      10/05/2023   10:16 AM 08/13/2023    1:16 PM 12/01/2021    2:41 PM 08/25/2021    3:19 PM 02/24/2021    9:50 AM  Depression screen PHQ 2/9  Decreased Interest 1 0 0 0 0  Down, Depressed, Hopeless 0 0 0 0 0  PHQ - 2 Score 1 0 0 0 0  Altered sleeping 1 1   0  Tired, decreased energy 1 1   0  Change in appetite 0 1   0  Feeling bad or failure about yourself  0 0   0  Trouble concentrating 0 0   0  Moving slowly or fidgety/restless 0 0   0  Suicidal thoughts 0 0   0  PHQ-9 Score 3 3   0  Difficult doing work/chores  Not difficult at all           10/05/2023   10:16 AM 08/13/2023    1:17 PM 02/24/2021    9:55 AM  GAD 7 : Generalized Anxiety Score  Nervous, Anxious, on Edge 0 0 0  Control/stop worrying 0 0 0  Worry too much - different things 0 0 0  Trouble relaxing 0 0 0  Restless 0 0 0  Easily annoyed or irritable 0 0 0  Afraid - awful might happen 0 0 0  Total GAD 7 Score 0 0 0  Anxiety Difficulty  Not difficult at all      Review of Systems:   Pertinent items are noted in HPI Denies abnormal vaginal discharge w/ itching/odor/irritation, headaches, visual changes, shortness of breath, chest pain, abdominal pain, severe nausea/vomiting, or problems with urination or bowel movements unless otherwise stated above. Pertinent History Reviewed:  Reviewed past medical,surgical, social, obstetrical and family history.  Reviewed problem list, medications and  allergies. Physical Assessment:   Vitals:   01/24/24 1020  BP: 115/71  Pulse: 87  Weight: 253 lb (114.8 kg)  Body mass index is 43.43 kg/m.           Physical Examination:   General appearance: alert, well appearing, and in no distress  Mental status: alert, oriented to person, place, and time  Skin: warm & dry   Extremities: Edema: None    Cardiovascular: normal heart rate noted  Respiratory: normal respiratory effort, no distress  Abdomen: gravid, soft, non-tender  Pelvic: Cervical exam deferred         Chaperone: N/A    Fetal Status:     Movement: Present    Fetal Surveillance Testing today: none     No results found for this or any previous visit (from the past 24 hours).  Assessment & Plan:  High-risk pregnancy: H3E6885 at [redacted]w[redacted]d with an Estimated Date of Delivery: 04/07/24   1. [redacted] weeks gestation of pregnancy (Primary)   2. Anemia affecting pregnancy in third trimester Continue Fe  3. Supervision of high risk pregnancy, antepartum   4. BMI 40.0-44.9, adult (HCC)  EFW q 4 weeks, testing at 32 weeks  5. History of prior pregnancy with IUGR newborn     Meds: No orders of the defined types were placed in this encounter.   Orders:  Orders Placed This Encounter  Procedures   US  FETAL BPP WO NON STRESS     Labs/procedures today: none   Reviewed: Preterm labor symptoms and general obstetric precautions including but not limited to vaginal bleeding, contractions, leaking of fluid and fetal movement were reviewed in detail with the patient.  All questions were answered. Does have home bp cuff. Office bp cuff given: not applicable. Check bp weekly, let us  know if consistently >140 and/or >90.  Follow-up: Return for ASAP and then every 4 weeks EFW; 3 weeks HROB; start twice weekly NSTs in 5 weeks.   No future appointments.  Orders Placed This Encounter  Procedures   US  FETAL BPP WO NON STRESS   Cathlean Ely , DNP, CNM [facility] 01/24/2024 10:53 AM

## 2024-01-24 NOTE — Patient Instructions (Signed)
 Dominique Garza, I greatly value your feedback.  If you receive a survey following your visit with us  today, we appreciate you taking the time to fill it out.  Thanks, Sherrell Ely, DNP, CNM  [facility] HAS MOVED!!! It is now [facility] at [facility] ([address] [place], [place] [address]) Entrance located off of [address] Free 24/7 valet parking   Go to [email or web address] to register for FREE online childbirth classes    Call the office [phone]) or go to [facility] if: You begin to have strong, frequent contractions Your water breaks.  Sometimes it is a big gush of fluid, sometimes it is just a trickle that keeps getting your panties wet or running down your legs You have vaginal bleeding.  It is normal to have a small amount of spotting if your cervix was checked.  You don't feel your baby moving like normal.  If you don't, get you something to eat and drink and lay down and focus on feeling your baby move.  You should feel at least 10 movements in 2 hours.  If you don't, you should call the office or go to [facility].   Home Blood Pressure Monitoring for Patients   Your provider has recommended that you check your blood pressure (BP) at least once a week at home. If you do not have a blood pressure cuff at home, one will be provided for you. Contact your provider if you have not received your monitor within 1 week.   Helpful Tips for Accurate Home Blood Pressure Checks  Don't smoke, exercise, or drink caffeine 30 minutes before checking your BP Use the restroom before checking your BP (a full bladder can raise your pressure) Relax in a comfortable upright chair Feet on the ground Left arm resting comfortably on a flat surface at the level of your heart Legs uncrossed Back supported Sit quietly and don't talk Place the cuff on your bare arm Adjust snuggly, so that only two fingertips can fit between your skin and the top of  the cuff Check 2 readings separated by at least one minute Keep a log of your BP readings For a visual, please reference this diagram: http://ccnc.care/bpdiagram  Provider Name: [facility]     Phone: [phone]  Zone 1: ALL CLEAR  Continue to monitor your symptoms:  BP reading is less than 140 (top number) or less than 90 (bottom number)  No right upper stomach pain No headaches or seeing spots No feeling nauseated or throwing up No swelling in face and hands  Zone 2: CAUTION Call your doctor's office for any of the following:  BP reading is greater than 140 (top number) or greater than 90 (bottom number)  Stomach pain under your ribs in the middle or right side Headaches or seeing spots Feeling nauseated or throwing up Swelling in face and hands  Zone 3: EMERGENCY  Seek immediate medical care if you have any of the following:  BP reading is greater than160 (top number) or greater than 110 (bottom number) Severe headaches not improving with Tylenol  Serious difficulty catching your breath Any worsening symptoms from Zone 2

## 2024-01-24 NOTE — Addendum Note (Signed)
 Addended by: SANNA GONG A on: 01/24/2024 11:02 AM   Modules accepted: Orders

## 2024-01-28 ENCOUNTER — Ambulatory Visit (INDEPENDENT_AMBULATORY_CARE_PROVIDER_SITE_OTHER)

## 2024-01-28 DIAGNOSIS — Z8759 Personal history of other complications of pregnancy, childbirth and the puerperium: Secondary | ICD-10-CM

## 2024-01-28 DIAGNOSIS — O09523 Supervision of elderly multigravida, third trimester: Secondary | ICD-10-CM | POA: Diagnosis not present

## 2024-01-28 DIAGNOSIS — Z3A30 30 weeks gestation of pregnancy: Secondary | ICD-10-CM | POA: Diagnosis not present

## 2024-01-28 DIAGNOSIS — O0990 Supervision of high risk pregnancy, unspecified, unspecified trimester: Secondary | ICD-10-CM

## 2024-01-28 DIAGNOSIS — Z6841 Body Mass Index (BMI) 40.0 and over, adult: Secondary | ICD-10-CM

## 2024-01-28 NOTE — Progress Notes (Signed)
 US  30 wks,cephalic,anterior placenta gr 2,AFI 23 cm,FHR 127 bpm,EFW 1576 g 53%

## 2024-01-30 ENCOUNTER — Encounter: Payer: Self-pay | Admitting: Family Medicine

## 2024-02-14 ENCOUNTER — Ambulatory Visit: Admitting: Obstetrics and Gynecology

## 2024-02-14 ENCOUNTER — Encounter: Payer: Self-pay | Admitting: Obstetrics and Gynecology

## 2024-02-14 VITALS — BP 121/79 | HR 93 | Wt 261.0 lb

## 2024-02-14 DIAGNOSIS — O09299 Supervision of pregnancy with other poor reproductive or obstetric history, unspecified trimester: Secondary | ICD-10-CM

## 2024-02-14 DIAGNOSIS — Z8759 Personal history of other complications of pregnancy, childbirth and the puerperium: Secondary | ICD-10-CM

## 2024-02-14 DIAGNOSIS — O0993 Supervision of high risk pregnancy, unspecified, third trimester: Secondary | ICD-10-CM | POA: Diagnosis not present

## 2024-02-14 DIAGNOSIS — O99013 Anemia complicating pregnancy, third trimester: Secondary | ICD-10-CM | POA: Diagnosis not present

## 2024-02-14 DIAGNOSIS — O09293 Supervision of pregnancy with other poor reproductive or obstetric history, third trimester: Secondary | ICD-10-CM | POA: Diagnosis not present

## 2024-02-14 DIAGNOSIS — Z3A32 32 weeks gestation of pregnancy: Secondary | ICD-10-CM | POA: Diagnosis not present

## 2024-02-14 DIAGNOSIS — O0990 Supervision of high risk pregnancy, unspecified, unspecified trimester: Secondary | ICD-10-CM

## 2024-02-14 NOTE — Progress Notes (Signed)
 PRENATAL VISIT NOTE  Subjective:  Dominique Garza is a 38 y.o. H3E6885 at [redacted]w[redacted]d being seen today for ongoing prenatal care.  She is currently monitored for the following issues for this high-risk pregnancy and has Pap smear for cervical cancer screening; Acne; Hives of unknown origin; Oligomenorrhea; Vitamin D  deficiency; IFG (impaired fasting glucose); History of pre-eclampsia in prior pregnancy, currently pregnant; Supervision of high risk pregnancy, antepartum; History of prior pregnancy with IUGR newborn; AMA (advanced maternal age) multigravida 35+; Asymptomatic bacteriuria during pregnancy in first trimester; BMI 40.0-44.9, adult (HCC); and Anemia affecting pregnancy in third trimester on their problem list.  Patient reports R hip pain.  Contractions: Irregular. Vag. Bleeding: None.  Movement: Present. Denies leaking of fluid.   The following portions of the patient's history were reviewed and updated as appropriate: allergies, current medications, past family history, past medical history, past social history, past surgical history and problem list.   Objective:    Vitals:   02/14/24 0906  BP: 121/79  Pulse: 93  Weight: 118.4 kg   Fetal Status:  Fetal Heart Rate (bpm): 130 Fundal Height: 33 cm Movement: Present    General: Alert, oriented and cooperative. Patient is in no acute distress.  Skin: Skin is warm and dry. No rash noted.   Cardiovascular: Normal heart rate noted  Respiratory: Normal respiratory effort, no problems with respiration noted  Abdomen: Soft, gravid, appropriate for gestational age.  Pain/Pressure: Present     Pelvic: Deferred        Extremities: Normal range of motion.  Edema: None  Mental Status: Normal mood and affect. Normal behavior. Normal judgment and thought content.   Assessment and Plan:  Pregnancy: H3E6885 at [redacted]w[redacted]d 1. Supervision of high risk pregnancy, antepartum - Feeling regular, vigorous fetal movement - New right hip pain; discussed  maternity support belt  2. [redacted] weeks gestation of pregnancy (Primary) - q2w routine prenatal visit  3. Anemia affecting pregnancy in third trimester - Hgb 9.4 01/03/24  - Taking ferrous sulfate  as prescribed - Recheck Hgb POC next visit  4. History of pre-eclampsia in prior pregnancy, currently pregnant - Taking daily aspirin  81 mg - Normotensive this pregnancy  5. History of prior pregnancy with IUGR newborn - EFW 53% at 30w - Next EFW 02/28/24  Preterm labor symptoms and general obstetric precautions including but not limited to vaginal bleeding, contractions, leaking of fluid and fetal movement were reviewed in detail with the patient. Please refer to After Visit Summary for other counseling recommendations.   Future Appointments  Date Time Provider Department Center  02/28/2024  8:30 AM [facility] US  [facility] None  02/28/2024  9:30 AM Jayne Vonn DEL, MD [facility]  03/06/2024  9:10 AM [facility] NURSE [facility]  03/06/2024  9:30 AM Marilynn Nest, DO [facility]  03/09/2024 10:10 AM [facility] NURSE [facility]  03/13/2024 10:30 AM [facility] NURSE [facility]  03/13/2024 10:50 AM Kizzie Suzen SAUNDERS, CNM [facility]  03/16/2024  9:10 AM [facility] NURSE [facility]  03/20/2024  9:10 AM [facility] NURSE [facility]  03/20/2024  9:30 AM Jayne Vonn DEL, MD [facility]  03/23/2024  9:10 AM [facility] NURSE [facility]  03/27/2024  8:30 AM [facility] - [facility] US  [facility] None  03/27/2024  9:30 AM Jayne Vonn DEL, MD [facility]  04/03/2024  9:10 AM [facility] NURSE [facility]  04/03/2024  9:30 AM Jayne Vonn DEL, MD [facility]  04/10/2024  9:10 AM [facility] NURSE [facility]   Vernell  Honore, [facility] 02/14/2024 11:44 AM

## 2024-02-15 ENCOUNTER — Inpatient Hospital Stay (HOSPITAL_COMMUNITY)
Admission: AD | Admit: 2024-02-15 | Discharge: 2024-02-15 | Disposition: A | Discharge status: Home / Self Care | Attending: Obstetrics and Gynecology | Admitting: Obstetrics and Gynecology

## 2024-02-15 DIAGNOSIS — O09293 Supervision of pregnancy with other poor reproductive or obstetric history, third trimester: Secondary | ICD-10-CM | POA: Insufficient documentation

## 2024-02-15 DIAGNOSIS — O99013 Anemia complicating pregnancy, third trimester: Secondary | ICD-10-CM

## 2024-02-15 DIAGNOSIS — Z3A32 32 weeks gestation of pregnancy: Secondary | ICD-10-CM

## 2024-02-15 DIAGNOSIS — O479 False labor, unspecified: Secondary | ICD-10-CM | POA: Diagnosis not present

## 2024-02-15 DIAGNOSIS — O365930 Maternal care for other known or suspected poor fetal growth, third trimester, not applicable or unspecified: Secondary | ICD-10-CM | POA: Diagnosis not present

## 2024-02-15 DIAGNOSIS — O99213 Obesity complicating pregnancy, third trimester: Secondary | ICD-10-CM | POA: Insufficient documentation

## 2024-02-15 DIAGNOSIS — O4703 False labor before 37 completed weeks of gestation, third trimester: Secondary | ICD-10-CM | POA: Insufficient documentation

## 2024-02-15 LAB — COMPREHENSIVE METABOLIC PANEL WITH GFR
ALT: 38 U/L (ref 0–44)
AST: 35 U/L (ref 15–41)
Albumin: 2.7 g/dL — ABNORMAL LOW (ref 3.5–5.0)
Alkaline Phosphatase: 99 U/L (ref 38–126)
Anion gap: 9 (ref 5–15)
BUN: 6 mg/dL (ref 6–20)
CO2: 20 mmol/L — ABNORMAL LOW (ref 22–32)
Calcium: 8.8 mg/dL — ABNORMAL LOW (ref 8.9–10.3)
Chloride: 104 mmol/L (ref 98–111)
Creatinine, Ser: 0.82 mg/dL (ref 0.44–1.00)
GFR, Estimated: >60 mL/min (ref >60)
Glucose, Bld: 135 mg/dL — ABNORMAL HIGH (ref 70–99)
Potassium: 3.3 mmol/L — ABNORMAL LOW (ref 3.5–5.1)
Sodium: 133 mmol/L — ABNORMAL LOW (ref 135–145)
Total Bilirubin: 0.4 mg/dL (ref 0.0–1.2)
Total Protein: 6.1 g/dL — ABNORMAL LOW (ref 6.5–8.1)

## 2024-02-15 LAB — URINALYSIS, ROUTINE W REFLEX MICROSCOPIC
Bilirubin Urine: NEGATIVE
Glucose, UA: NEGATIVE mg/dL
Hgb urine dipstick: NEGATIVE
Ketones, ur: NEGATIVE mg/dL
Nitrite: NEGATIVE
Protein, ur: NEGATIVE mg/dL
Specific Gravity, Urine: 1.017 (ref 1.005–1.030)
pH: 6 (ref 5.0–8.0)

## 2024-02-15 LAB — POCT FERN TEST: POCT Fern Test: NEGATIVE

## 2024-02-15 LAB — CBC
HCT: 27.6 % — ABNORMAL LOW (ref 36.0–46.0)
Hemoglobin: 9.3 g/dL — ABNORMAL LOW (ref 12.0–15.0)
MCH: 30.5 pg (ref 26.0–34.0)
MCHC: 33.7 g/dL (ref 30.0–36.0)
MCV: 90.5 fL (ref 80.0–100.0)
Platelets: 178 K/uL (ref 150–400)
RBC: 3.05 MIL/uL — ABNORMAL LOW (ref 3.87–5.11)
RDW: 13.1 % (ref 11.5–15.5)
WBC: 11.2 K/uL — ABNORMAL HIGH (ref 4.0–10.5)
nRBC: 0 % (ref 0.0–0.2)

## 2024-02-15 LAB — WET PREP, GENITAL
Clue Cells Wet Prep HPF POC: NONE SEEN
Sperm: NONE SEEN
Trich, Wet Prep: NONE SEEN
WBC, Wet Prep HPF POC: <10 (ref <10)

## 2024-02-15 MED ORDER — LACTATED RINGERS IV BOLUS: 1000.0000 mL | Freq: Once | INTRAVENOUS | Status: DC

## 2024-02-15 MED ORDER — FLUCONAZOLE 150 MG PO TABS: 150.0000 mg | ORAL_TABLET | Freq: Once | ORAL | 0 refills | Status: AC

## 2024-02-15 NOTE — MAU Note (Addendum)
 Dominique Garza is a 38 y.o. at [redacted]w[redacted]d here in MAU reporting feeling tightening in her abdomen today and feels like lightening bolts since 1700. Feels like tightening but not really painful. Leaked fld one time this am but none since. Good FM and no VB. Light headed since 1700. States yesterday had a period of time she urinated a lot but no dysuria  LMP: na Onset of complaint: this am Pain score: 0 Vitals:   02/15/24 1956 02/15/24 2000  BP:  110/76  Pulse: 88   Resp: 18   Temp: 98.6 F (37 C)   SpO2: 100%      FHT: 150  Lab orders placed from triage: ua

## 2024-02-15 NOTE — MAU Provider Note (Cosign Needed Addendum)
 Chief Complaint:  Contractions   HPI   None     Dominique Garza is a 38 y.o. H3E6885 at [redacted]w[redacted]d who presents to maternity admissions reporting abdominal cramps, vaginal discharge, and lightheadedness. She reports feeling her abdomen tightening and lightning bolts of pain since 1700 today. She has been lightheaded since then as well. The cramping/contractions started every 10 minutes, got closer together until they were every 3-5 minutes for about an hour, and have now spread out to very infrequent.  Pregnancy Course: Receives care at [facility] for [facility] . Prenatal records reviewed. Pregnancy complicated by AMA, BMI >40, anemia, hx IUGR, hx preeclampsia.  Past Medical History:  Diagnosis Date   Acne 12/27/2014   Anemia    Anxiety    Fibromyalgia    Hives of unknown origin 12/27/2014   HSV-2 seropositive    Ovarian cyst    Pregnancy induced hypertension    UTI (urinary tract infection)    OB History  Gravida Para Term Preterm AB Living  6 4 3 1 1 4   SAB IAB Ectopic Multiple Live Births  1   0 4    # Outcome Date GA Lbr Len/2nd Weight Sex Type Anes PTL Lv  6 Current           5 Term 09/02/14 [redacted]w[redacted]d / 00:41 2715 g F Vag-Spont EPI  LIV  4 SAB 07/2013          3 Term 02/15/11 [redacted]w[redacted]d  2892 g M Vag-Spont EPI  LIV  2 Preterm 02/26/10 [redacted]w[redacted]d  1673 g F Vag-Spont EPI  LIV     Birth Comments: d/t pre-e  1 Term 02/01/08 [redacted]w[redacted]d  2665 g F Vag-Spont EPI  LIV    Obstetric Comments  Pre E with 2nd and 4th preg   Past Surgical History:  Procedure Laterality Date   TONSILLECTOMY     WISDOM TOOTH EXTRACTION Right 2022   Family History  Problem Relation Age of Onset   Fibromyalgia Mother    Sarcoidosis Mother    Hypertension Father    Diabetes Father    Diabetes Paternal Grandmother    Social History   Tobacco Use   Smoking status: Former    Types: Cigarettes, Cigars   Smokeless tobacco: Never   Tobacco comments:    Smoked for about a year  Vaping Use   Vaping  status: Never Used  Substance Use Topics   Alcohol use: No   Drug use: No   No Known Allergies No medications prior to admission.    I have reviewed patient's Past Medical Hx, Surgical Hx, Family Hx, Social Hx, medications and allergies.   ROS  Pertinent items noted in HPI and remainder of comprehensive ROS otherwise negative.   PHYSICAL EXAM  Patient Vitals for the past 24 hrs:  BP Temp Temp src Pulse Resp SpO2 Height Weight  02/15/24 2207 131/71 -- -- 97 -- -- -- --  02/15/24 2200 (!) 140/77 97.6 F (36.4 C) Oral 95 -- 99 % -- --  02/15/24 2040 128/69 98.6 F (37 C) Oral 97 18 99 % -- --  02/15/24 2000 110/76 -- -- -- -- -- -- --  02/15/24 1956 -- 98.6 F (37 C) -- 88 18 100 % 5' 4.5 (1.638 m) 117 kg    Constitutional: Well-developed, well-nourished female in no acute distress.  HEENT: atraumatic, normocephalic. Neck has normal ROM. EOM intact. Cardiovascular: normal rate & rhythm, warm and well-perfused Respiratory: normal effort, no problems with  respiration noted GI: Abd soft, non-tender, non-distended MSK: Extremities nontender, no edema, normal ROM Skin: warm and dry. Acyanotic, no jaundice or pallor. Neurologic: Alert and oriented x 4. No abnormal coordination. Psychiatric: Normal mood. Speech not slurred, not rapid/pressured. Patient is cooperative. GU: no CVA tenderness Pelvic exam: exam chaperoned by Silvano Peers RN.  Dilation: Fingertip Effacement (%): Thick Station: Ballotable Exam by:: Joesph Sear PA-C  Fetal Tracing: Baseline FHR: 130 Fetal heart variability: Moderate Fetal Heart Rate accelerations: Present Fetal Heart Rate decelerations: Absent Fetal Non-stress Test: Reassuring Toco: no uterine contractions   Labs: Results for orders placed or performed during the hospital encounter of 02/15/24 (from the past 24 hours)  Urinalysis, Routine w reflex microscopic -Urine, Clean Catch     Status: Abnormal   Collection Time: 02/15/24  8:05 PM   Result Value Ref Range   Color, Urine YELLOW YELLOW   APPearance HAZY (A) CLEAR   Specific Gravity, Urine 1.017 1.005 - 1.030   pH 6.0 5.0 - 8.0   Glucose, UA NEGATIVE NEGATIVE mg/dL   Hgb urine dipstick NEGATIVE NEGATIVE   Bilirubin Urine NEGATIVE NEGATIVE   Ketones, ur NEGATIVE NEGATIVE mg/dL   Protein, ur NEGATIVE NEGATIVE mg/dL   Nitrite NEGATIVE NEGATIVE   Leukocytes,Ua LARGE (A) NEGATIVE   RBC / HPF 0-5 0 - 5 RBC/hpf   WBC, UA 6-10 0 - 5 WBC/hpf   Bacteria, UA MANY (A) NONE SEEN   Squamous Epithelial / HPF 6-10 0 - 5 /HPF   Mucus PRESENT    Budding Yeast PRESENT   Comprehensive metabolic panel     Status: Abnormal   Collection Time: 02/15/24  8:58 PM  Result Value Ref Range   Sodium 133 (L) 135 - 145 mmol/L   Potassium 3.3 (L) 3.5 - 5.1 mmol/L   Chloride 104 98 - 111 mmol/L   CO2 20 (L) 22 - 32 mmol/L   Glucose, Bld 135 (H) 70 - 99 mg/dL   BUN 6 6 - 20 mg/dL   Creatinine, Ser 9.17 0.44 - 1.00 mg/dL   Calcium 8.8 (L) 8.9 - 10.3 mg/dL   Total Protein 6.1 (L) 6.5 - 8.1 g/dL   Albumin 2.7 (L) 3.5 - 5.0 g/dL   AST 35 15 - 41 U/L   ALT 38 0 - 44 U/L   Alkaline Phosphatase 99 38 - 126 U/L   Total Bilirubin 0.4 0.0 - 1.2 mg/dL   GFR, Estimated >39 >39 mL/min   Anion gap 9 5 - 15  CBC     Status: Abnormal   Collection Time: 02/15/24  8:58 PM  Result Value Ref Range   WBC 11.2 (H) 4.0 - 10.5 K/uL   RBC 3.05 (L) 3.87 - 5.11 MIL/uL   Hemoglobin 9.3 (L) 12.0 - 15.0 g/dL   HCT 72.3 (L) 63.9 - 53.9 %   MCV 90.5 80.0 - 100.0 fL   MCH 30.5 26.0 - 34.0 pg   MCHC 33.7 30.0 - 36.0 g/dL   RDW 86.8 88.4 - 84.4 %   Platelets 178 150 - 400 K/uL   nRBC 0.0 0.0 - 0.2 %  Wet prep, genital     Status: Abnormal   Collection Time: 02/15/24  9:03 PM   Specimen: Vaginal  Result Value Ref Range   Yeast Wet Prep HPF POC PRESENT (A) NONE SEEN   Trich, Wet Prep NONE SEEN NONE SEEN   Clue Cells Wet Prep HPF POC NONE SEEN NONE SEEN   WBC, Wet Prep HPF POC <  10 <10   Sperm NONE SEEN   POCT  fern test     Status: None   Collection Time: 02/15/24  9:06 PM  Result Value Ref Range   POCT Fern Test Negative = intact amniotic membranes     Imaging:  No results found.  MDM & MAU COURSE  MDM: High  MAU Course: -Vital signs within normal limits. -UA and wet prep for infection. -CBC for anemia. CMP for electrolytes. Mercie test to rule out ROM, low suspicion given watery vaginal discharge once today in small volume but none since. -Fern negative, no ROM. -No cervical change, not in labor. -Care handed over to oncoming provider Dr. Danny at 2100. Joesph Sear PA-C  Differential diagnosis considered for lower abdominal pain includes but is not limited to: round ligament pain, torsion, UTI, pyelonephritis, PID, cervicitis, appendicitis, diverticulitis, constipation, nephrolithiasis  Orders Placed This Encounter  Procedures   Wet prep, genital   Culture, OB Urine   Urinalysis, Routine w reflex microscopic -Urine, Clean Catch   Comprehensive metabolic panel   CBC   POCT fern test   Discharge patient Discharge disposition: 01-Home or Self Care; Discharge patient date: 02/15/2024   Meds ordered this encounter  Medications   DISCONTD: lactated ringers  bolus 1,000 mL   fluconazole  (DIFLUCAN ) 150 MG tablet    Sig: Take 1 tablet (150 mg total) by mouth once for 1 dose.    Dispense:  1 tablet    Refill:  0    ASSESSMENT  I received signout on this patient pending CBC, UA, wet prep.  The data in this note was refreshed. Hgb 9.3, patient states she is taking an iron supplement and a booster.  I encouraged her to discuss iron infusion with her prenatal provider.  Yeast on wet prep, will treat with Diflucan  x 1.  This could potentially be the cause of her uterine irritability.  UA was sent for culture, patient aware that we will contact her if antibiotic treatment is needed.  1. False labor   2. [redacted] weeks gestation of pregnancy   3. Anemia affecting pregnancy in third trimester       PLAN  Discharge home in stable condition with return precautions.     Follow-up Information     [facility].   Why: as previously scheduled. Ask about iron infusions (IV iron instead of or in addition to the iron pills) Contact information: [address] [place]  [phone]                Allergies as of 02/15/2024   (No Known Allergies)     Barabara Danny, DO FMOB Fellow, Faculty Practice [facility], Center for [facility]

## 2024-02-15 NOTE — Discharge Instructions (Signed)
 Ask about iron infusions (IV iron instead of or in addition to the iron pills)   Please return to the MAU (Maternity Assessment Unit) if you experience vaginal bleeding,leaking/gush of fluid like your water broke, notice decreased movement from your baby after doing kick counts, or contractions the are becoming more intense or more frequent.   Fetal Movement Counts When you're pregnant, you might start feeling your baby move around the middle of your pregnancy. At first, these movements might feel like flutters, rolls, or swishes. As your baby grows, you might feel more kicks and jabs. Around week 28 of your pregnancy, your health care team may ask you to count how often your baby moves. This is important for all pregnancies, but especially for high-risk ones. Counting movements can help lessen the risk of stillbirth.  What is a fetal movement count? A fetal movement count is the number of times that you feel your baby move during a certain amount of time. This may also be called a kick count. There are many ways to do a kick count. Ask your team what is best for you. Pay attention to when your baby is most active. You may notice your baby's sleep and wake cycles. You may also notice things that make your baby move more. When you do a kick count, try to do it: When your baby is normally most active. At the same time each day.  How do I count fetal movements? Find a quiet, comfortable area. Sit or lie down. Write down the date, the start time, and the number of movements you feel. Count kicks, flutters, swishes, rolls, and jabs. Usually, you will feel at least 10 movements within 2 hours. Stop counting after you have felt 10 movements or if you have been counting for 2 hours. Write down the stop time.  Contact a health care provider if:  You don't feel 10 movements in 2 hours. Your baby isn't moving as it usually does. Your baby isn't moving at all. If you're not able to reach your provider,  go to an emergency room. This information is not intended to replace advice given to you by your health care provider. Make sure you discuss any questions you have with your health care provider.  Document Revised: 05/21/2023 Document Reviewed: 05/13/2022 Elsevier Patient Education  2025 [employer].  Signs and Symptoms of Labor Labor is the body's natural process of moving the baby and the placenta out of the uterus. The process of labor usually starts when the baby is full-term, 37 weeks and 0 days or more.   As your body prepares for labor and the birth of your baby, you may notice the following symptoms in the weeks and days before true labor starts: Your baby dropping lower into your pelvis to get into position for birth (lightening). When this happens, you may feel more pressure on your bladder and pelvic bone and less pressure on your ribs. This may make it easier to breathe. It may also cause you to need to urinate more often and have problems with bowel movements. Having practice contractions, also called Braxton Hicks contractions or false labor. These occur at irregular (unevenly spaced) intervals that are more than 10 minutes apart. False labor contractions are common after exercise or sexual activity. They will stop if you change position, rest, or drink fluids. These contractions are usually mild and do not get stronger over time. They may feel like: A backache or back pain. Mild cramps, similar to  menstrual cramps. Tightening or pressure in your abdomen. Passing a small amount of thick, bloody mucus from your vagina. This is called normal bloody show or losing your mucus plug. This may happen more than a week before labor begins, or right before labor begins, as the opening of the cervix starts to widen (dilate). For some women, the entire mucus plug passes at once. For others, pieces of the mucus plug may gradually pass over several days.  Signs and symptoms that labor has  begun Signs that you are in labor may include: Having contractions that come at regular (evenly spaced) intervals and increase in intensity. This may feel like more intense tightening or pressure in your abdomen that moves to your back. Feeling pressure in the vaginal area. Your water breaking (called rupture of membranes). This is when the sac of fluid that surrounds your baby breaks. Fluid leaking from your vagina may be clear or blood-tinged. Labor usually starts within 24 hours of your water breaking, but it may take longer to begin. Some people may feel a sudden gush of fluid; others may notice repeatedly damp underwear.  Go to the hospital when  Your water breaks. Your labor has started: painful, regular contractions that are 5 minutes apart or less. You have a fever. You have bright red blood coming from your vagina. You do not feel your baby moving. You have a severe headache with or without vision problems. You have chest pain or shortness of breath. These symptoms may represent a serious problem that is an emergency. Do not wait to see if the symptoms will go away. Get medical help right away. Call your local emergency services (911 in the [place] Do not drive yourself to the hospital.  Summary Labor is your body's natural process of moving your baby and the placenta out of your uterus. The process of labor usually starts when your baby is full-term When labor starts, or if your water breaks, call your health care provider or nurse care line. Based on your situation, they will determine when you should go in for an exam. This information is not intended to replace advice given to you by your health care provider. Make sure you discuss any questions you have with your health care provider.  Document Revised: 09/10/2020 Document Reviewed: 09/10/2020-- adapted Elsevier Patient Education  2024 [employer].

## 2024-02-16 ENCOUNTER — Other Ambulatory Visit: Payer: Self-pay | Admitting: Women's Health

## 2024-02-16 ENCOUNTER — Encounter: Payer: Self-pay | Admitting: Women's Health

## 2024-02-17 ENCOUNTER — Telehealth: Payer: Self-pay

## 2024-02-17 NOTE — Telephone Encounter (Signed)
 Hello,  Pt will be scheduled as soon as possible.  Auth Submission: NO AUTH NEEDED Site of care: Site of care: [facility] Payer: [employer] MEDICA[employer] HE[employer] BLUE  Medication & CPT/J Code(s) submitted: Venofer (Iron Sucrose) J1756 Diagnosis Code:  Route of submission (phone, fax, portal): PHONE Phone # Fax # Auth type: Buy/Bill HB Units/visits requested: 200mg  x 5 doses Reference number:  Approval from: 02/17/24 to 05/10/24

## 2024-02-18 ENCOUNTER — Telehealth: Payer: Self-pay | Admitting: *Deleted

## 2024-02-18 ENCOUNTER — Ambulatory Visit: Payer: Self-pay

## 2024-02-18 DIAGNOSIS — E559 Vitamin D deficiency, unspecified: Secondary | ICD-10-CM

## 2024-02-18 DIAGNOSIS — N390 Urinary tract infection, site not specified: Secondary | ICD-10-CM

## 2024-02-18 LAB — CULTURE, OB URINE: Culture: >=100000 — AB

## 2024-02-18 NOTE — Progress Notes (Signed)
 Complex Care Management Note Care Guide Note  02/18/2024 Name: Dominique Garza MRN: [id number] DOB: Nov 07, 1985   Complex Care Management Outreach Attempts: An unsuccessful telephone outreach was attempted today to offer the patient information about available complex care management services.  Follow Up Plan:  Additional outreach attempts will be made to offer the patient complex care management information and services.   Encounter Outcome:  No Answer  Thedford Franks, CMA, [place]  [facility], [facility] Guide Direct Dial: [phone]  Fax: [phone] Website: [email or web address]

## 2024-02-21 NOTE — Progress Notes (Unsigned)
 Complex Care Management Note Care Guide Note  02/21/2024 Name: Dominique Garza MRN: [id number] DOB: 06-Aug-1985   Complex Care Management Outreach Attempts: A second unsuccessful outreach was attempted today to offer the patient with information about available complex care management services.  Follow Up Plan:  Additional outreach attempts will be made to offer the patient complex care management information and services.   Encounter Outcome:  No Answer  Thedford Franks, CMA [place]  [facility], [facility] Guide Direct Dial: [phone]  Fax: [phone] Website: [email or web address]

## 2024-02-22 ENCOUNTER — Other Ambulatory Visit: Payer: Self-pay

## 2024-02-22 ENCOUNTER — Telehealth: Payer: Self-pay

## 2024-02-22 ENCOUNTER — Other Ambulatory Visit: Payer: Self-pay | Admitting: Obstetrics and Gynecology

## 2024-02-22 MED ORDER — CEFADROXIL 500 MG PO CAPS
500.0000 mg | ORAL_CAPSULE | Freq: Two times a day (BID) | ORAL | 0 refills | Status: DC
Start: 2024-02-22 — End: 2024-03-23

## 2024-02-22 NOTE — Progress Notes (Signed)
 Rx for duricef for uti from MAU

## 2024-02-22 NOTE — Progress Notes (Signed)
 Complex Care Management Note  Care Guide Note 02/22/2024 Name: SHYAN SCALISI MRN: [id number] DOB: [birth year]  Vella JINNY Molt is a 38 y.o. year old female who sees Bacchus, Meade PEDLAR, FNP for primary care. I reached out to Vella JINNY Molt by phone today to offer complex care management services.  Ms. Wester was given information about Complex Care Management services today including:   The Complex Care Management services include support from the care team which includes your Nurse Care Manager, Clinical Social Worker, or Pharmacist.  The Complex Care Management team is here to help remove barriers to the health concerns and goals most important to you. Complex Care Management services are voluntary, and the patient may decline or stop services at any time by request to their care team member.   Complex Care Management Consent Status: Patient agreed to services and verbal consent obtained.   Follow up plan:  Telephone appointment with complex care management team member scheduled for:  03/17/2024  Encounter Outcome:  Patient Scheduled  Thedford Franks, CMA [place]  [facility], [facility] Guide Direct Dial: [phone]  Fax: [phone] Website: [email or web address]

## 2024-02-22 NOTE — Telephone Encounter (Signed)
 Spoke with patient and made her aware that [facility] was trying to get in touch with her about her urine culture results and plan of care. Patient aware now and expecting a call.

## 2024-02-23 ENCOUNTER — Encounter: Attending: Women's Health | Admitting: Emergency Medicine

## 2024-02-23 ENCOUNTER — Other Ambulatory Visit: Payer: Self-pay | Admitting: Women's Health

## 2024-02-23 VITALS — BP 120/69 | HR 86 | Temp 98.4°F | Resp 15

## 2024-02-23 DIAGNOSIS — Z3A34 34 weeks gestation of pregnancy: Secondary | ICD-10-CM | POA: Insufficient documentation

## 2024-02-23 DIAGNOSIS — O99013 Anemia complicating pregnancy, third trimester: Secondary | ICD-10-CM | POA: Insufficient documentation

## 2024-02-23 DIAGNOSIS — Z3A33 33 weeks gestation of pregnancy: Secondary | ICD-10-CM | POA: Diagnosis present

## 2024-02-23 DIAGNOSIS — D508 Other iron deficiency anemias: Secondary | ICD-10-CM | POA: Insufficient documentation

## 2024-02-23 DIAGNOSIS — Z3A35 35 weeks gestation of pregnancy: Secondary | ICD-10-CM | POA: Insufficient documentation

## 2024-02-23 MED ORDER — IRON SUCROSE 20 MG/ML IV SOLN
200.0000 mg | Freq: Once | INTRAVENOUS | Status: AC
  Administered 2024-02-23: 200 mg via INTRAVENOUS

## 2024-02-23 MED ORDER — SULFAMETHOXAZOLE-TRIMETHOPRIM 800-160 MG PO TABS: 1.0000 | ORAL_TABLET | Freq: Two times a day (BID) | ORAL | 0 refills | Status: DC

## 2024-02-23 MED ORDER — ACETAMINOPHEN 325 MG PO TABS
650.0000 mg | ORAL_TABLET | Freq: Once | ORAL | Status: AC
  Administered 2024-02-23: 650 mg via ORAL

## 2024-02-23 MED ORDER — DIPHENHYDRAMINE HCL 25 MG PO CAPS
25.0000 mg | ORAL_CAPSULE | Freq: Once | ORAL | Status: AC
  Administered 2024-02-23: 25 mg via ORAL

## 2024-02-23 NOTE — Progress Notes (Signed)
 Diagnosis: Iron Deficiency Anemia  Provider:  Luke Fetters CNM  Procedure: IV Push  IV Type: Peripheral, IV Location: R Antecubital  Venofer (Iron Sucrose), Dose: 200 mg  Post Infusion IV Care: Observation period completed and Peripheral IV Discontinued  Discharge: Condition: Good, Destination: Home . AVS Provided  Performed by:  Delon ONEIDA Officer, RN

## 2024-02-25 ENCOUNTER — Encounter: Admitting: Emergency Medicine

## 2024-02-25 VITALS — BP 119/72 | HR 85 | Temp 98.4°F | Resp 15

## 2024-02-25 DIAGNOSIS — O99013 Anemia complicating pregnancy, third trimester: Secondary | ICD-10-CM | POA: Diagnosis not present

## 2024-02-25 DIAGNOSIS — D508 Other iron deficiency anemias: Secondary | ICD-10-CM

## 2024-02-25 MED ORDER — DIPHENHYDRAMINE HCL 25 MG PO CAPS
25.0000 mg | ORAL_CAPSULE | Freq: Once | ORAL | Status: AC
  Administered 2024-02-25: 25 mg via ORAL

## 2024-02-25 MED ORDER — IRON SUCROSE 20 MG/ML IV SOLN
200.0000 mg | Freq: Once | INTRAVENOUS | Status: AC
  Administered 2024-02-25: 200 mg via INTRAVENOUS

## 2024-02-25 MED ORDER — ACETAMINOPHEN 325 MG PO TABS
650.0000 mg | ORAL_TABLET | Freq: Once | ORAL | Status: AC
  Administered 2024-02-25: 650 mg via ORAL

## 2024-02-25 NOTE — Progress Notes (Signed)
 Diagnosis: Iron Deficiency Anemia  Provider:  Luke Fetters CNM  Procedure: IV Push  IV Type: Peripheral, IV Location: R Antecubital  Venofer (Iron Sucrose), Dose: 200 mg  Post Infusion IV Care: Observation period completed and Peripheral IV Discontinued  Discharge: Condition: Good, Destination: Home . AVS Provided  Performed by:  Delon ONEIDA Officer, RN

## 2024-02-25 NOTE — Patient Instructions (Signed)
[id number] 

## 2024-02-28 ENCOUNTER — Other Ambulatory Visit

## 2024-02-28 ENCOUNTER — Encounter: Payer: Self-pay | Admitting: Women's Health

## 2024-02-28 ENCOUNTER — Ambulatory Visit

## 2024-02-28 ENCOUNTER — Ambulatory Visit (INDEPENDENT_AMBULATORY_CARE_PROVIDER_SITE_OTHER): Admitting: Women's Health

## 2024-02-28 VITALS — BP 119/75 | HR 89 | Wt 255.4 lb

## 2024-02-28 VITALS — BP 106/67 | HR 71 | Temp 98.3°F | Resp 15

## 2024-02-28 DIAGNOSIS — O09293 Supervision of pregnancy with other poor reproductive or obstetric history, third trimester: Secondary | ICD-10-CM

## 2024-02-28 DIAGNOSIS — O365930 Maternal care for other known or suspected poor fetal growth, third trimester, not applicable or unspecified: Secondary | ICD-10-CM | POA: Diagnosis not present

## 2024-02-28 DIAGNOSIS — O99013 Anemia complicating pregnancy, third trimester: Secondary | ICD-10-CM

## 2024-02-28 DIAGNOSIS — O0993 Supervision of high risk pregnancy, unspecified, third trimester: Secondary | ICD-10-CM | POA: Diagnosis not present

## 2024-02-28 DIAGNOSIS — O09523 Supervision of elderly multigravida, third trimester: Secondary | ICD-10-CM

## 2024-02-28 DIAGNOSIS — Z6841 Body Mass Index (BMI) 40.0 and over, adult: Secondary | ICD-10-CM

## 2024-02-28 DIAGNOSIS — O99213 Obesity complicating pregnancy, third trimester: Secondary | ICD-10-CM | POA: Diagnosis not present

## 2024-02-28 DIAGNOSIS — O0990 Supervision of high risk pregnancy, unspecified, unspecified trimester: Secondary | ICD-10-CM

## 2024-02-28 DIAGNOSIS — Z3A34 34 weeks gestation of pregnancy: Secondary | ICD-10-CM

## 2024-02-28 MED ORDER — DIPHENHYDRAMINE HCL 25 MG PO CAPS
25.0000 mg | ORAL_CAPSULE | Freq: Once | ORAL | Status: AC
  Administered 2024-02-28: 25 mg via ORAL

## 2024-02-28 MED ORDER — ACYCLOVIR 400 MG PO TABS: 400.0000 mg | ORAL_TABLET | Freq: Three times a day (TID) | ORAL | 3 refills | Status: DC

## 2024-02-28 MED ORDER — ACETAMINOPHEN 325 MG PO TABS
650.0000 mg | ORAL_TABLET | Freq: Once | ORAL | Status: AC
  Administered 2024-02-28: 650 mg via ORAL

## 2024-02-28 MED ORDER — IRON SUCROSE 20 MG/ML IV SOLN
200.0000 mg | Freq: Once | INTRAVENOUS | Status: AC
  Administered 2024-02-28: 200 mg via INTRAVENOUS

## 2024-02-28 NOTE — Progress Notes (Signed)
 HIGH-RISK PREGNANCY VISIT Patient name: Dominique Garza MRN [id number]  Date of birth: [birth year] Chief Complaint:   Routine Prenatal Visit  History of Present Illness:   Dominique Garza is a 38 y.o. H3E6885 female at [redacted]w[redacted]d with an Estimated Date of Delivery: 04/07/24 being seen today for ongoing management of a high-risk pregnancy complicated by PG BMI 42, h/o FGR x3, h/o pre-e.    Today she reports no current complaints. Went to [facility] 10[facility] w/ ^ home bp (normal at [facility]), contractions, dx w/ UTI- just started antibiotics last week. Contractions: Not present. Vag. Bleeding: None.  Movement: Present. denies leaking of fluid.      02/23/2024    9:12 AM 10/05/2023   10:16 AM 08/13/2023    1:16 PM 12/01/2021    2:41 PM 08/25/2021    3:19 PM  Depression screen PHQ 2/9  Decreased Interest 0 1 0 0 0  Down, Depressed, Hopeless 0 0 0 0 0  PHQ - 2 Score 0 1 0 0 0  Altered sleeping  1 1    Tired, decreased energy  1 1    Change in appetite  0 1    Feeling bad or failure about yourself   0 0    Trouble concentrating  0 0    Moving slowly or fidgety/restless  0 0    Suicidal thoughts  0 0    PHQ-9 Score  3 3    Difficult doing work/chores   Not difficult at all          10/05/2023   10:16 AM 08/13/2023    1:17 PM 02/24/2021    9:55 AM  GAD 7 : Generalized Anxiety Score  Nervous, Anxious, on Edge 0 0 0  Control/stop worrying 0 0 0  Worry too much - different things 0 0 0  Trouble relaxing 0 0 0  Restless 0 0 0  Easily annoyed or irritable 0 0 0  Afraid - awful might happen 0 0 0  Total GAD 7 Score 0 0 0  Anxiety Difficulty  Not difficult at all      Review of Systems:   Pertinent items are noted in HPI Denies abnormal vaginal discharge w/ itching/odor/irritation, headaches, visual changes, shortness of breath, chest pain, abdominal pain, severe nausea/vomiting, or problems with urination or bowel movements unless otherwise stated above. Pertinent History Reviewed:  Reviewed past  medical,surgical, social, obstetrical and family history.  Reviewed problem list, medications and allergies. Physical Assessment:   Vitals:   02/28/24 0913  BP: 119/75  Pulse: 89  Weight: 255 lb 6.4 oz (115.8 kg)  Body mass index is 43.16 kg/m.           Physical Examination:   General appearance: alert, well appearing, and in no distress  Mental status: alert, oriented to person, place, and time  Skin: warm & dry   Extremities:      Cardiovascular: normal heart rate noted  Respiratory: normal respiratory effort, no distress  Abdomen: gravid, soft, non-tender  Pelvic: Cervical exam deferred         Fetal Status:     Movement: Present    Fetal Surveillance Testing today: US  34+3 wks,cephalic,anterior placenta gr 3,FHR 137 bpm,AFI 18 cm,EFW 2425 g 44%   Chaperone: N/A  No results found for this or any previous visit (from the past 24 hours).  Assessment & Plan:  High-risk pregnancy: H3E6885 at [redacted]w[redacted]d with an Estimated Date of Delivery: 04/07/24   1) PGBMI 42,  currently 43  2) H/O pre-e, bp great, continue checking daily at home, if >140/90 or pre-e sx let us  know  4) H/O FGR x 3> EFW 44% today  5) HSV> never any outbreaks, rx acyclovir  to begin for suppression  6) Anemia> has had 2 IV Venofer infusions, has 3 left  7) Recent UTI> still on antibiotics, urine cx next visit  Meds:  Meds ordered this encounter  Medications   acyclovir  (ZOVIRAX ) 400 MG tablet    Sig: Take 1 tablet (400 mg total) by mouth 3 (three) times daily.    Dispense:  90 tablet    Refill:  3    Labs/procedures today: U/S  Treatment Plan:  EFW q4w   2x/wk nst or weekly BPP @ 34-36wks   Deliver @ 39-40.6wks   Reviewed: Preterm labor symptoms and general obstetric precautions including but not limited to vaginal bleeding, contractions, leaking of fluid and fetal movement were reviewed in detail with the patient.  All questions were answered. Does have home bp cuff. Office bp cuff given: not  applicable. Check bp daily, let us  know if consistently >140 and/or >90.  Follow-up: Return for As scheduled.   Future Appointments  Date Time Provider Department Center  02/28/2024 10:00 AM APINF-CHAIR 1 AP-INFCTR None  03/01/2024 10:30 AM APINF-CHAIR 1 AP-INFCTR None  03/06/2024  9:10 AM [facility]-FTOBGYN NURSE [facility]-FT [facility]  03/06/2024  9:30 AM Marilynn Nest, DO [facility]FT [facility]  03/06/2024 10:30 AM APINF-CHAIR 1 AP-INFCTR None  03/09/2024 10:10 AM [facility]FT[facility]GY[facility] NURSE [facility]  03/13/2024 10:30 AM [facility]FT[facility]GY[facility] NURSE [facility]  03/13/2024 10:50 AM Kizzie Suzen SAUNDERS, CNM [facility]  03/16/2024  9:10 AM [facility]FTOBGYN NURSE [facility]FT [facility]  03/17/2024  1:00 PM Bertrum Rosina HERO, RN CHL-POPH None  03/20/2024  9:10 AM [facility]FT[facility]GYN NURSE [facility]FT [facility]  03/20/2024  9:30 AM Jayne Vonn DEL, MD [facility]FT [facility]  03/23/2024  9:10 AM [facility]-FT[facility]GYN NURSE [facility]FT [facility]  03/27/2024  8:30 AM C[facility] - FTOBGYN US  CWH-FTIMG None  03/27/2024  9:30 AM Jayne Vonn DEL, MD [facility]FT [facility]  04/03/2024  9:10 AM [facility]FT[facility]GYN NURSE [facility]FT [facility]  04/03/2024  9:30 AM Jayne Vonn DEL, MD [facility]  04/10/2024  9:10 AM [facility]FT[facility]GY[facility] NURSE [facility]FT [facility]    No orders of the defined types were placed in this encounter.  Suzen SAUNDERS Kizzie CNM, [facility] 02/28/2024 9:38 AM

## 2024-02-28 NOTE — Progress Notes (Signed)
 Diagnosis: Iron Deficiency Anemia  Provider:  Luke Fetters CNM  Procedure: IV Push  IV Type: Peripheral, IV Location: R Antecubital  Venofer (Iron Sucrose), Dose: 200 mg  Post Infusion IV Care: Observation period completed and Peripheral IV Discontinued  Discharge: Condition: Good, Destination: Home . AVS Provided  Performed by:  Delon ONEIDA Officer, RN

## 2024-02-28 NOTE — Progress Notes (Signed)
 US  34+3 wks,cephalic,anterior placenta gr 3,FHR 137 bpm,AFI 18 cm,EFW 2425 g 44%

## 2024-02-28 NOTE — Patient Instructions (Addendum)
 Vella, thank you for choosing our office today! We appreciate the opportunity to meet your healthcare needs. You may receive a short survey by mail, e-mail, or through [employer]. If you are happy with your care we would appreciate if you could take just a few minutes to complete the survey questions. We read all of your comments and take your feedback very seriously. Thank you again for choosing our office.  Center for [employer] Team at [facility]  [facility] at [facility] ([address] [place], [place] [address]) Entrance C, located off of [address] Free 24/7 valet parking   CLASSES: Go to [email or web address] to register for classes (childbirth, breastfeeding, waterbirth, infant CPR, daddy bootcamp, etc.)  Tips to Help You Sleep Better:  Get into a bedtime routine, try to do the same thing every night before going to bed to try to help your body wind down Warm baths Avoid caffeine for at least 3 hours before going to sleep  Keep your room at a slightly cooler temperature, can try running a fan Turn off TV, lights, phone, electronics Lots of pillows if needed to help you get comfortable Lavender scented items can help you sleep. You can place lavender essential oil on a cotton ball and place under your pillowcase, or place in a diffuser. [employer] has a lavender scented sleep line (plug-ins, sprays, etc). Look in the pillow aisle for lavender scented pillows.  If none of the above things help, you can try 1/2 to 1 tablet of benadryl , unisom, or tylenol  pm. Do not take this every night, only when you really need it.    Call the office [phone]) or go to [facility] if: You begin to have strong, frequent contractions Your water breaks.  Sometimes it is a big gush of fluid, sometimes it is just a trickle that keeps getting your panties wet or running down your legs You have vaginal bleeding.  It is normal to have a small amount of spotting if your cervix was  checked.  You don't feel your baby moving like normal.  If you don't, get you something to eat and drink and lay down and focus on feeling your baby move.   If your baby is still not moving like normal, you should call the office or go to [facility].  Call the office [phone]) or go to [facility] hospital for these signs of pre-eclampsia: Severe headache that does not go away with Tylenol  Visual changes- seeing spots, double, blurred vision Pain under your right breast or upper abdomen that does not go away with Tums or heartburn medicine Nausea and/or vomiting Severe swelling in your hands, feet, and face   Tdap Vaccine It is recommended that you get the Tdap vaccine during the third trimester of EACH pregnancy to help protect your baby from getting pertussis (whooping cough) 27-36 weeks is the BEST time to do this so that you can pass the protection on to your baby. During pregnancy is better than after pregnancy, but if you are unable to get it during pregnancy it will be offered at the hospital.  You can get this vaccine with us , at the health department, your family doctor, or some local pharmacies Everyone who will be around your baby should also be up-to-date on their vaccines before the baby comes. Adults (who are not pregnant) only need 1 dose of Tdap during adulthood.   [facility] Pediatricians/Family Doctors [facility]): [address], [phone]  [facility]: [address], [phone]                [facility]): [address], [phone] (call to ask if accepting patients) [facility] Department: [address], [place], [phone]    [facility] Pediatricians/Family Doctors [facility]): [phone], [phone] [facility]: [address] [place], [phone] [facility] [place]: [address],  [phone]  [facility] Doctors  [facility]): [phone] [facility]: [address], [phone]   [facility] Doctors [facility]: [address], [phone]  [facility] Doctors  [facility]: [phone], [phone]  Home Blood Pressure Monitoring for Patients   Your provider has recommended that you check your blood pressure (BP) at least once a week at home. If you do not have a blood pressure cuff at home, one will be provided for you. Contact your provider if you have not received your monitor within 1 week.   Helpful Tips for Accurate Home Blood Pressure Checks  Don't smoke, exercise, or drink caffeine 30 minutes before checking your BP Use the restroom before checking your BP (a full bladder can raise your pressure) Relax in a comfortable upright chair Feet on the ground Left arm resting comfortably on a flat surface at the level of your heart Legs uncrossed Back supported Sit quietly and don't talk Place the cuff on your bare arm Adjust snuggly, so that only two fingertips can fit between your skin and the top of the cuff Check 2 readings separated by at least one minute Keep a log of your BP readings For a visual, please reference this diagram: http://ccnc.care/bpdiagram  Provider Name: [facility]     Phone: [phone]  Zone 1: ALL CLEAR  Continue to monitor your symptoms:  BP reading is less than 140 (top number) or less than 90 (bottom number)  No right upper stomach pain No headaches or seeing spots No feeling nauseated or throwing up No swelling in face and hands  Zone 2: CAUTION Call your doctor's office for any of the following:  BP reading is greater than 140 (top number) or greater than 90 (bottom number)  Stomach pain under your ribs in the middle or right side Headaches or seeing spots Feeling nauseated or throwing up Swelling in face  and hands  Zone 3: EMERGENCY  Seek immediate medical care if you have any of the following:  BP reading is greater than160 (top number) or greater than 110 (bottom number) Severe headaches not improving with Tylenol  Serious difficulty catching your breath Any worsening symptoms from Zone 2  Preterm Labor and Birth Information  The normal length of a pregnancy is 39-41 weeks. Preterm labor is when labor starts before 37 completed weeks of pregnancy. What are the risk factors for preterm labor? Preterm labor is more likely to occur in women who: Have certain infections during pregnancy such as a bladder infection, sexually transmitted infection, or infection inside the uterus (chorioamnionitis). Have a shorter-than-normal cervix. Have gone into preterm labor before. Have had surgery on their cervix. Are younger than age 74 or older than age 65. Are African American. Are pregnant with twins or multiple babies (multiple gestation). Take street drugs or smoke while pregnant. Do not gain enough weight while pregnant. Became pregnant shortly after having been pregnant. What are the symptoms of  preterm labor? Symptoms of preterm labor include: Cramps similar to those that can happen during a menstrual period. The cramps may happen with diarrhea. Pain in the abdomen or lower back. Regular uterine contractions that may feel like tightening of the abdomen. A feeling of increased pressure in the pelvis. Increased watery or bloody mucus discharge from the vagina. Water breaking (ruptured amniotic sac). Why is it important to recognize signs of preterm labor? It is important to recognize signs of preterm labor because babies who are born prematurely may not be fully developed. This can put them at an increased risk for: Long-term (chronic) heart and lung problems. Difficulty immediately after birth with regulating body systems, including blood sugar, body temperature, heart rate, and breathing  rate. Bleeding in the brain. Cerebral palsy. Learning difficulties. Death. These risks are highest for babies who are born before 34 weeks of pregnancy. How is preterm labor treated? Treatment depends on the length of your pregnancy, your condition, and the health of your baby. It may involve: Having a stitch (suture) placed in your cervix to prevent your cervix from opening too early (cerclage). Taking or being given medicines, such as: Hormone medicines. These may be given early in pregnancy to help support the pregnancy. Medicine to stop contractions. Medicines to help mature the baby's lungs. These may be prescribed if the risk of delivery is high. Medicines to prevent your baby from developing cerebral palsy. If the labor happens before 34 weeks of pregnancy, you may need to stay in the hospital. What should I do if I think I am in preterm labor? If you think that you are going into preterm labor, call your health care provider right away. How can I prevent preterm labor in future pregnancies? To increase your chance of having a full-term pregnancy: Do not use any tobacco products, such as cigarettes, chewing tobacco, and e-cigarettes. If you need help quitting, ask your health care provider. Do not use street drugs or medicines that have not been prescribed to you during your pregnancy. Talk with your health care provider before taking any herbal supplements, even if you have been taking them regularly. Make sure you gain a healthy amount of weight during your pregnancy. Watch for infection. If you think that you might have an infection, get it checked right away. Make sure to tell your health care provider if you have gone into preterm labor before. This information is not intended to replace advice given to you by your health care provider. Make sure you discuss any questions you have with your health care provider. Document Revised: 08/19/2018 Document Reviewed:  09/18/2015 Elsevier Patient Education  2020 [employer].

## 2024-03-01 ENCOUNTER — Encounter: Admitting: Emergency Medicine

## 2024-03-01 VITALS — BP 98/63 | HR 92 | Temp 97.9°F | Resp 15

## 2024-03-01 DIAGNOSIS — O99013 Anemia complicating pregnancy, third trimester: Secondary | ICD-10-CM

## 2024-03-01 MED ORDER — IRON SUCROSE 20 MG/ML IV SOLN
200.0000 mg | Freq: Once | INTRAVENOUS | Status: AC
  Administered 2024-03-01: 200 mg via INTRAVENOUS

## 2024-03-01 MED ORDER — DIPHENHYDRAMINE HCL 25 MG PO CAPS
25.0000 mg | ORAL_CAPSULE | Freq: Once | ORAL | Status: AC
  Administered 2024-03-01: 25 mg via ORAL

## 2024-03-01 MED ORDER — ACETAMINOPHEN 325 MG PO TABS
650.0000 mg | ORAL_TABLET | Freq: Once | ORAL | Status: AC
  Administered 2024-03-01: 650 mg via ORAL

## 2024-03-01 NOTE — Progress Notes (Signed)
 Diagnosis: Iron Deficiency Anemia  Provider:  Suzen Fetters CNM  Procedure: IV Push  IV Type: Peripheral, IV Location: R Antecubital  Venofer (Iron Sucrose), Dose: 200 mg  Post Infusion IV Care: Observation period completed and Peripheral IV Discontinued  Discharge: Condition: Good, Destination: Home . AVS Provided  Performed by:  Delon ONEIDA Officer, RN

## 2024-03-06 ENCOUNTER — Encounter: Payer: Self-pay | Admitting: Obstetrics & Gynecology

## 2024-03-06 ENCOUNTER — Ambulatory Visit (INDEPENDENT_AMBULATORY_CARE_PROVIDER_SITE_OTHER): Admitting: Obstetrics & Gynecology

## 2024-03-06 ENCOUNTER — Encounter: Admitting: *Deleted

## 2024-03-06 ENCOUNTER — Other Ambulatory Visit (HOSPITAL_COMMUNITY)
Admission: RE | Admit: 2024-03-06 | Discharge: 2024-03-06 | Disposition: A | Discharge status: Home / Self Care | Source: Ambulatory Visit | Attending: Obstetrics & Gynecology | Admitting: Obstetrics & Gynecology

## 2024-03-06 ENCOUNTER — Other Ambulatory Visit

## 2024-03-06 VITALS — BP 100/67 | HR 81 | Temp 98.0°F | Resp 16

## 2024-03-06 VITALS — BP 106/71 | HR 98 | Wt 261.0 lb

## 2024-03-06 DIAGNOSIS — O99210 Obesity complicating pregnancy, unspecified trimester: Secondary | ICD-10-CM

## 2024-03-06 DIAGNOSIS — O99213 Obesity complicating pregnancy, third trimester: Secondary | ICD-10-CM | POA: Diagnosis not present

## 2024-03-06 DIAGNOSIS — Z124 Encounter for screening for malignant neoplasm of cervix: Secondary | ICD-10-CM | POA: Diagnosis not present

## 2024-03-06 DIAGNOSIS — O09293 Supervision of pregnancy with other poor reproductive or obstetric history, third trimester: Secondary | ICD-10-CM

## 2024-03-06 DIAGNOSIS — O99013 Anemia complicating pregnancy, third trimester: Secondary | ICD-10-CM

## 2024-03-06 DIAGNOSIS — Z113 Encounter for screening for infections with a predominantly sexual mode of transmission: Secondary | ICD-10-CM | POA: Insufficient documentation

## 2024-03-06 DIAGNOSIS — O0993 Supervision of high risk pregnancy, unspecified, third trimester: Secondary | ICD-10-CM

## 2024-03-06 DIAGNOSIS — Z8619 Personal history of other infectious and parasitic diseases: Secondary | ICD-10-CM

## 2024-03-06 DIAGNOSIS — Z3A35 35 weeks gestation of pregnancy: Secondary | ICD-10-CM

## 2024-03-06 DIAGNOSIS — O09299 Supervision of pregnancy with other poor reproductive or obstetric history, unspecified trimester: Secondary | ICD-10-CM

## 2024-03-06 MED ORDER — IRON SUCROSE 20 MG/ML IV SOLN
200.0000 mg | Freq: Once | INTRAVENOUS | Status: AC
  Administered 2024-03-06: 200 mg via INTRAVENOUS

## 2024-03-06 MED ORDER — ACETAMINOPHEN 325 MG PO TABS
650.0000 mg | ORAL_TABLET | Freq: Once | ORAL | Status: AC
  Administered 2024-03-06: 650 mg via ORAL

## 2024-03-06 MED ORDER — DIPHENHYDRAMINE HCL 25 MG PO CAPS
25.0000 mg | ORAL_CAPSULE | Freq: Once | ORAL | Status: AC
  Administered 2024-03-06: 25 mg via ORAL

## 2024-03-06 NOTE — Progress Notes (Deleted)
 HIGH-RISK PREGNANCY VISIT Patient name: Dominique Garza MRN [id number]  Date of birth: August 12, 1985 Chief Complaint:   Routine Prenatal Visit and Non-stress Test  History of Present Illness:   Dominique Garza is a 38 y.o. H3E6885 female at [redacted]w[redacted]d with an Estimated Date of Delivery: 04/07/24 being seen today for ongoing management of a high-risk pregnancy complicated by:  -h/o HSV -h/o FGR x 3 -Obesity -Anemia -h/o UTI- just finished medication, []  plan to complete TOC next visit  Today she reports occasional contractions.   Contractions: Not present. Vag. Bleeding: None.  Movement: Present. denies leaking of fluid.      03/01/2024   10:30 AM 02/28/2024   10:11 AM 02/23/2024    9:12 AM 10/05/2023   10:16 AM 08/13/2023    1:16 PM  Depression screen PHQ 2/9  Decreased Interest 0 0 0 1 0  Down, Depressed, Hopeless 0 0 0 0 0  PHQ - 2 Score 0 0 0 1 0  Altered sleeping    1 1  Tired, decreased energy    1 1  Change in appetite    0 1  Feeling bad or failure about yourself     0 0  Trouble concentrating    0 0  Moving slowly or fidgety/restless    0 0  Suicidal thoughts    0 0  PHQ-9 Score    3 3  Difficult doing work/chores     Not difficult at all     Current Outpatient Medications  Medication Instructions   acyclovir  (ZOVIRAX ) 400 mg, Oral, 3 times daily   aspirin  EC 162 mg, Oral, Daily, Swallow whole.   Blood Pressure Monitor MISC For regular home bp monitoring during pregnancy   cefadroxil  (DURICEF) 500 mg, Oral, 2 times daily   ferrous sulfate  325 mg, Oral, Every other day   fluconazole  (DIFLUCAN ) 150 mg,  Once   Prenatal Vit-Fe Fumarate-FA (PRENATAL VITAMINS PLUS PO)    UNABLE TO FIND Iron blood builder-daily   UNABLE TO FIND Emergency Vit C-packet once or twice a week     Review of Systems:   Pertinent items are noted in HPI Denies abnormal vaginal discharge w/ itching/odor/irritation, headaches, visual changes, shortness of breath, chest pain, abdominal pain, severe  nausea/vomiting, or problems with urination or bowel movements unless otherwise stated above. Pertinent History Reviewed:  Reviewed past medical,surgical, social, obstetrical and family history.  Reviewed problem list, medications and allergies. Physical Assessment:   Vitals:   03/06/24 0915  BP: 106/71  Pulse: 98  Weight: 261 lb (118.4 kg)  Body mass index is 44.11 kg/m.           Physical Examination:   General appearance: alert, well appearing, and in no distress  Mental status: normal mood, behavior, speech, dress, motor activity, and thought processes  Skin: warm & dry   Extremities:   no edema or calf tenderness   Cardiovascular: normal heart rate noted  Respiratory: normal respiratory effort, no distress  Abdomen: gravid, soft, non-tender  Pelvic: Cervical exam performed  Pap collected, GC/C obtained       Fetal Status:     Movement: Present    Fetal Surveillance Testing today: NST  NST being performed due to ***   Fetal Monitoring:  Baseline: *** bpm, Variability: moderate, Accelerations: ***, The accelerations are >15 bpm and more than 2 in 20 minutes, and Decelerations: {FHR DECEL PRESENT:31526}     Final diagnosis:  *** Reactive NST  Chaperone: {Chaperone:19197::N/A,Latisha Cresenzo,Janet Young,Amanda Andrews,Peggy Dones,Nicole Virrueta,Angel Neas}    No results found for this or any previous visit (from the past 24 hours).   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   Labs/procedures today: ***  Treatment Plan:  ***  Reviewed: {Blank single:19197::Term,Preterm} labor symptoms and general obstetric precautions including but not limited to vaginal bleeding, contractions, leaking of fluid and fetal movement  were reviewed in detail with the patient.  All questions were answered. *** home bp cuff. Check bp weekly, let us  know if >140/90.   Follow-up: No follow-ups on file.   Future Appointments  Date Time Provider Department Center  03/06/2024 10:30 AM APINF-CHAIR 1 AP-INFCTR None  03/09/2024 10:10 AM C[facility]-FTOBGYN NURSE [facility]-FT [facility]TO[facility]  03/13/2024 10:30 AM [facility]FT[facility]GYN NURSE [facility]FT [facility]  03/13/2024 10:50 AM Kizzie Suzen SAUNDERS, CNM [facility]FT [facility]  03/16/2024  9:10 AM [facility]-FT[facility]GYN NURSE [facility]  03/17/2024  1:00 PM Bertrum Rosina HERO, RN CHL-POPH None  03/20/2024  9:10 AM [facility]FT[facility]GYN NURSE [facility]FT [facility]  03/20/2024  9:30 AM Jayne Vonn DEL, MD [facility]FT [facility]  03/23/2024  9:10 AM [facility]-FT[facility]GYN NURSE [facility]FT [facility]  03/27/2024  8:30 AM C[facility] - FTOBGYN US  CWH-FTIMG None  03/27/2024  9:30 AM Jayne Vonn DEL, MD [facility]  04/03/2024  9:10 AM [facility]FT[facility]GYN NURSE [facility]  04/03/2024  9:30 AM Jayne Vonn DEL, MD [facility]  04/10/2024  9:10 AM [facility]FT[facility]GYN NURSE [facility]    Orders Placed This Encounter  Procedures   Culture, beta strep (group b only)    Delon Prude, DO Attending Obstetrician & Gynecologist, Faculty Practice Center for [employer], [facility]

## 2024-03-06 NOTE — Progress Notes (Signed)
 Diagnosis: Iron Deficiency Anemia  Provider:  Luke Fetters, CNM  Procedure: IV Push  IV Type: Peripheral, IV Location: R Antecubital  Venofer (Iron Sucrose), Dose: 200 mg  Post Infusion IV Care: Observation period completed  Discharge: Condition: Good, Destination: Home . AVS Provided  Performed by:  Baldwin Darice Helling, RN

## 2024-03-06 NOTE — Progress Notes (Signed)
 HIGH-RISK PREGNANCY VISIT Patient name: Dominique Garza MRN [id number]  Date of birth: [birth year] Chief Complaint:   Routine Prenatal Visit and Non-stress Test  History of Present Illness:   Dominique Garza is a 38 y.o. H3E6885 female at [redacted]w[redacted]d with an Estimated Date of Delivery: 04/07/24 being seen today for ongoing management of a high-risk pregnancy complicated by:  -h/o HSV on suppression -h/o FGR x 3 -Obesity -Anemia rc'd IV iron -h/o UTI- just finished medication, []  plan to complete TOC next visit  Today she reports occasional contractions.   Contractions: Not present. Vag. Bleeding: None.  Movement: Present. denies leaking of fluid.      03/01/2024   10:30 AM 02/28/2024   10:11 AM 02/23/2024    9:12 AM 10/05/2023   10:16 AM 08/13/2023    1:16 PM  Depression screen PHQ 2/9  Decreased Interest 0 0 0 1 0  Down, Depressed, Hopeless 0 0 0 0 0  PHQ - 2 Score 0 0 0 1 0  Altered sleeping    1 1  Tired, decreased energy    1 1  Change in appetite    0 1  Feeling bad or failure about yourself     0 0  Trouble concentrating    0 0  Moving slowly or fidgety/restless    0 0  Suicidal thoughts    0 0  PHQ-9 Score    3 3  Difficult doing work/chores     Not difficult at all     Current Outpatient Medications  Medication Instructions   acyclovir  (ZOVIRAX ) 400 mg, Oral, 3 times daily   aspirin  EC 162 mg, Oral, Daily, Swallow whole.   Blood Pressure Monitor MISC For regular home bp monitoring during pregnancy   cefadroxil  (DURICEF) 500 mg, Oral, 2 times daily   ferrous sulfate  325 mg, Oral, Every other day   fluconazole  (DIFLUCAN ) 150 mg,  Once   Prenatal Vit-Fe Fumarate-FA (PRENATAL VITAMINS PLUS PO)    UNABLE TO FIND Iron blood builder-daily   UNABLE TO FIND Emergency Vit C-packet once or twice a week     Review of Systems:   Pertinent items are noted in HPI Denies abnormal vaginal discharge w/ itching/odor/irritation, headaches, visual changes, shortness of breath, chest  pain, abdominal pain, severe nausea/vomiting, or problems with urination or bowel movements unless otherwise stated above. Pertinent History Reviewed:  Reviewed past medical,surgical, social, obstetrical and family history.  Reviewed problem list, medications and allergies. Physical Assessment:   Vitals:   03/06/24 0915  BP: 106/71  Pulse: 98  Weight: 261 lb (118.4 kg)  Body mass index is 44.11 kg/m.           Physical Examination:   General appearance: alert, well appearing, and in no distress  Mental status: normal mood, behavior, speech, dress, motor activity, and thought processes  Skin: warm & dry   Extremities:   no edema or calf tenderness   Cardiovascular: normal heart rate noted  Respiratory: normal respiratory effort, no distress  Abdomen: gravid, soft, non-tender  Pelvic: Cervical exam performed  Pap collected, GC/C obtainedDilation: Closed Effacement (%): Thick Station: -3  Fetal Status:     Movement: Present    Fetal Surveillance Testing today: NST  NST being performed due to Obesity   Fetal Monitoring:  Baseline: 130 bpm, Variability: moderate, Accelerations:present, The accelerations are >15 bpm and more than 2 in 20 minutes, and Decelerations: Absent     Final diagnosis:   Reactive NST  Chaperone: Aleck Blase    No results found for this or any previous visit (from the past 24 hours).   Assessment & Plan:  High-risk pregnancy: H3E6885 at [redacted]w[redacted]d with an Estimated Date of Delivery: 04/07/24   -h/o HSV on suppression -h/o FGR x 3 -Obesity Reactive NST today, continue antepartum testing []  plan for IOL ~ 39wk -Anemia rc'd IV iron -h/o UTI- just finished medication, []  plan to complete TOC next visit  Meds: No orders of the defined types were placed in this encounter.   Labs/procedures today: NST, Pap and cultures collected  Treatment Plan:  routine OB care and as outlined above  Reviewed: Preterm labor symptoms and general obstetric  precautions including but not limited to vaginal bleeding, contractions, leaking of fluid and fetal movement were reviewed in detail with the patient.  All questions were answered.  Follow-up: Return for twice weekly as scheduled.   Future Appointments  Date Time Provider Department Center  03/06/2024 10:30 AM APINF-CHAIR 1 AP-INFCTR None  03/09/2024 10:10 AM [facility] NURSE [facility]  03/13/2024 10:30 AM [facility] NURSE [facility]  03/13/2024 10:50 AM Kizzie Suzen SAUNDERS, CNM [facility]  03/16/2024  9:10 AM [facility] NURSE [facility]  03/17/2024  1:00 PM Bertrum Rosina HERO, RN [facility] None  03/20/2024  9:10 AM [facility] NURSE [facility]  03/20/2024  9:30 AM Jayne Vonn DEL, MD [facility]  03/23/2024  9:10 AM [facility] NURSE [facility]  03/27/2024  8:30 AM [facility] - [facility]TOBG[facility] US  C[facility]-FTIMG None  03/27/2024  9:30 AM Jayne Vonn DEL, MD [facility]  04/03/2024  9:10 AM [facility] NURSE [facility]  04/03/2024  9:30 AM Jayne Vonn DEL, MD [facility]  04/10/2024  9:10 AM [facility] NURSE [facility]    Orders Placed This Encounter  Procedures   Culture, beta strep (group b only)    Delon Prude, DO Attending Obstetrician & Gynecologist, Faculty Practice Center for [employer], [facility]

## 2024-03-08 ENCOUNTER — Ambulatory Visit: Payer: Self-pay | Admitting: Obstetrics & Gynecology

## 2024-03-08 LAB — CYTOLOGY - PAP
Chlamydia: NEGATIVE
Comment: NEGATIVE
Comment: NEGATIVE
Comment: NORMAL
Diagnosis: NEGATIVE
High risk HPV: NEGATIVE
Neisseria Gonorrhea: NEGATIVE

## 2024-03-09 ENCOUNTER — Ambulatory Visit: Admitting: *Deleted

## 2024-03-09 VITALS — BP 113/77 | HR 87

## 2024-03-09 DIAGNOSIS — Z6841 Body Mass Index (BMI) 40.0 and over, adult: Secondary | ICD-10-CM

## 2024-03-09 DIAGNOSIS — Z3A35 35 weeks gestation of pregnancy: Secondary | ICD-10-CM

## 2024-03-09 DIAGNOSIS — O0990 Supervision of high risk pregnancy, unspecified, unspecified trimester: Secondary | ICD-10-CM

## 2024-03-09 LAB — CULTURE, BETA STREP (GROUP B ONLY): Strep Gp B Culture: POSITIVE — AB

## 2024-03-09 NOTE — Progress Notes (Signed)
   NURSE VISIT- NST  SUBJECTIVE:  Dominique Garza is a 38 y.o. H3E6885 female at [redacted]w[redacted]d, here for a NST for pregnancy complicated by Morbid obesity (BMI >=40).  She reports active fetal movement, contractions: none, vaginal bleeding: none, membranes: intact.   OBJECTIVE:  BP 113/77   Pulse 87   Appears well, no apparent distress  No results found for this or any previous visit (from the past 24 hours).  NST: FHR baseline 120 bpm, Variability: moderate, Accelerations:present, Decelerations:  Absent= Cat 1/reactive Toco: none   ASSESSMENT: H3E6885 at [redacted]w[redacted]d with Morbid obesity (BMI >=40) NST reactive  PLAN: EFM strip reviewed by Dr. Ozan   Recommendations: keep next appointment as scheduled    Dominique Garza  03/09/2024 10:34 AM

## 2024-03-13 ENCOUNTER — Encounter: Payer: Self-pay | Admitting: Women's Health

## 2024-03-13 ENCOUNTER — Ambulatory Visit (INDEPENDENT_AMBULATORY_CARE_PROVIDER_SITE_OTHER): Admitting: Women's Health

## 2024-03-13 ENCOUNTER — Other Ambulatory Visit

## 2024-03-13 VITALS — BP 120/81 | HR 106 | Wt 263.0 lb

## 2024-03-13 DIAGNOSIS — O09893 Supervision of other high risk pregnancies, third trimester: Secondary | ICD-10-CM | POA: Diagnosis not present

## 2024-03-13 DIAGNOSIS — O2393 Unspecified genitourinary tract infection in pregnancy, third trimester: Secondary | ICD-10-CM | POA: Diagnosis not present

## 2024-03-13 DIAGNOSIS — B961 Klebsiella pneumoniae [K. pneumoniae] as the cause of diseases classified elsewhere: Secondary | ICD-10-CM

## 2024-03-13 DIAGNOSIS — O0990 Supervision of high risk pregnancy, unspecified, unspecified trimester: Secondary | ICD-10-CM

## 2024-03-13 DIAGNOSIS — O0993 Supervision of high risk pregnancy, unspecified, third trimester: Secondary | ICD-10-CM

## 2024-03-13 DIAGNOSIS — Z7982 Long term (current) use of aspirin: Secondary | ICD-10-CM | POA: Diagnosis not present

## 2024-03-13 DIAGNOSIS — O09523 Supervision of elderly multigravida, third trimester: Secondary | ICD-10-CM | POA: Diagnosis not present

## 2024-03-13 DIAGNOSIS — Z87440 Personal history of urinary (tract) infections: Secondary | ICD-10-CM | POA: Diagnosis not present

## 2024-03-13 DIAGNOSIS — Z3A36 36 weeks gestation of pregnancy: Secondary | ICD-10-CM

## 2024-03-13 DIAGNOSIS — Z6841 Body Mass Index (BMI) 40.0 and over, adult: Secondary | ICD-10-CM

## 2024-03-13 DIAGNOSIS — Z09 Encounter for follow-up examination after completed treatment for conditions other than malignant neoplasm: Secondary | ICD-10-CM

## 2024-03-13 NOTE — Patient Instructions (Addendum)
 Vella, thank you for choosing our office today! We appreciate the opportunity to meet your healthcare needs. You may receive a short survey by mail, e-mail, or through [employer]. If you are happy with your care we would appreciate if you could take just a few minutes to complete the survey questions. We read all of your comments and take your feedback very seriously. Thank you again for choosing our office.  Center for [employer] Team at [facility]  [facility] at [facility] ([address] [place], [place] [address]) Entrance C, located off of [address] Free 24/7 valet parking   CLASSES: Go to [email or web address] to register for classes (childbirth, breastfeeding, waterbirth, infant CPR, daddy bootcamp, etc.)  Call the office [phone]) or go to [facility] if: You begin to have strong, frequent contractions Your water breaks.  Sometimes it is a big gush of fluid, sometimes it is just a trickle that keeps getting your panties wet or running down your legs You have vaginal bleeding.  It is normal to have a small amount of spotting if your cervix was checked.  You don't feel your baby moving like normal.  If you don't, get you something to eat and drink and lay down and focus on feeling your baby move.   If your baby is still not moving like normal, you should call the office or go to [facility].  Call the office [phone]) or go to [facility] hospital for these signs of pre-eclampsia: Severe headache that does not go away with Tylenol  Visual changes- seeing spots, double, blurred vision Pain under your right breast or upper abdomen that does not go away with Tums or heartburn medicine Nausea and/or vomiting Severe swelling in your hands, feet, and face   Tdap Vaccine It is recommended that you get the Tdap vaccine during the third trimester of EACH pregnancy to help protect your baby from getting pertussis (whooping cough) 27-36 weeks is the BEST time to do  this so that you can pass the protection on to your baby. During pregnancy is better than after pregnancy, but if you are unable to get it during pregnancy it will be offered at the hospital.  You can get this vaccine with us , at the health department, your family doctor, or some local pharmacies Everyone who will be around your baby should also be up-to-date on their vaccines before the baby comes. Adults (who are not pregnant) only need 1 dose of Tdap during adulthood.   [facility] Pediatricians/Family Doctors [facility]): [address] Luba BROCKS, [phone]           [facility]: [address], [phone]                [facility]): [address], [phone] (call to ask if accepting patients) [facility] Department: [address], [place], [phone]    [facility] Pediatricians/Family Doctors [facility]): [phone] [address], [phone] [facility]: [address] [place], [phone] [facility]: [address], [phone]  [facility] Doctors  [facility]): [phone] [facility]: [address], [phone]   [facility] Doctors [facility]: [address] [phone]  [facility] Family Doctors  [facility]: [phone], [phone]  Home Blood Pressure Monitoring for Patients   Your provider has recommended that you check your  blood pressure (BP) at least once a week at home. If you do not have a blood pressure cuff at home, one will be provided for you. Contact your provider if you have not received your monitor within 1 week.   Helpful Tips for Accurate Home Blood Pressure Checks  Don't smoke, exercise, or drink caffeine 30 minutes before checking your BP Use the restroom before checking your BP (a full bladder can raise your  pressure) Relax in a comfortable upright chair Feet on the ground Left arm resting comfortably on a flat surface at the level of your heart Legs uncrossed Back supported Sit quietly and don't talk Place the cuff on your bare arm Adjust snuggly, so that only two fingertips can fit between your skin and the top of the cuff Check 2 readings separated by at least one minute Keep a log of your BP readings For a visual, please reference this diagram: http://ccnc.care/bpdiagram  Provider Name: [facility]     Phone: [phone]  Zone 1: ALL CLEAR  Continue to monitor your symptoms:  BP reading is less than 140 (top number) or less than 90 (bottom number)  No right upper stomach pain No headaches or seeing spots No feeling nauseated or throwing up No swelling in face and hands  Zone 2: CAUTION Call your doctor's office for any of the following:  BP reading is greater than 140 (top number) or greater than 90 (bottom number)  Stomach pain under your ribs in the middle or right side Headaches or seeing spots Feeling nauseated or throwing up Swelling in face and hands  Zone 3: EMERGENCY  Seek immediate medical care if you have any of the following:  BP reading is greater than160 (top number) or greater than 110 (bottom number) Severe headaches not improving with Tylenol  Serious difficulty catching your breath Any worsening symptoms from Zone 2  Preterm Labor and Birth Information  The normal length of a pregnancy is 39-41 weeks. Preterm labor is when labor starts before 37 completed weeks of pregnancy. What are the risk factors for preterm labor? Preterm labor is more likely to occur in women who: Have certain infections during pregnancy such as a bladder infection, sexually transmitted infection, or infection inside the uterus (chorioamnionitis). Have a shorter-than-normal cervix. Have gone into preterm labor before. Have had surgery on their cervix. Are younger than age 41  or older than age 31. Are African American. Are pregnant with twins or multiple babies (multiple gestation). Take street drugs or smoke while pregnant. Do not gain enough weight while pregnant. Became pregnant shortly after having been pregnant. What are the symptoms of preterm labor? Symptoms of preterm labor include: Cramps similar to those that can happen during a menstrual period. The cramps may happen with diarrhea. Pain in the abdomen or lower back. Regular uterine contractions that may feel like tightening of the abdomen. A feeling of increased pressure in the pelvis. Increased watery or bloody mucus discharge from the vagina. Water breaking (ruptured amniotic sac). Why is it important to recognize signs of preterm labor? It is important to recognize signs of preterm labor because babies who are born prematurely may not be fully developed. This can put them at an increased risk for: Long-term (chronic) heart and lung problems. Difficulty immediately after birth with regulating body systems, including blood sugar, body temperature, heart rate, and breathing rate. Bleeding in the brain. Cerebral palsy. Learning difficulties. Death. These risks are highest for babies who are born before 34 weeks  of pregnancy. How is preterm labor treated? Treatment depends on the length of your pregnancy, your condition, and the health of your baby. It may involve: Having a stitch (suture) placed in your cervix to prevent your cervix from opening too early (cerclage). Taking or being given medicines, such as: Hormone medicines. These may be given early in pregnancy to help support the pregnancy. Medicine to stop contractions. Medicines to help mature the baby's lungs. These may be prescribed if the risk of delivery is high. Medicines to prevent your baby from developing cerebral palsy. If the labor happens before 34 weeks of pregnancy, you may need to stay in the hospital. What should I do if I  think I am in preterm labor? If you think that you are going into preterm labor, call your health care provider right away. How can I prevent preterm labor in future pregnancies? To increase your chance of having a full-term pregnancy: Do not use any tobacco products, such as cigarettes, chewing tobacco, and e-cigarettes. If you need help quitting, ask your health care provider. Do not use street drugs or medicines that have not been prescribed to you during your pregnancy. Talk with your health care provider before taking any herbal supplements, even if you have been taking them regularly. Make sure you gain a healthy amount of weight during your pregnancy. Watch for infection. If you think that you might have an infection, get it checked right away. Make sure to tell your health care provider if you have gone into preterm labor before. This information is not intended to replace advice given to you by your health care provider. Make sure you discuss any questions you have with your health care provider. Document Revised: 08/19/2018 Document Reviewed: 09/18/2015 Elsevier Patient Education  2020 [employer].  For Headaches:  Stay well hydrated, drink enough water so that your urine is clear, sometimes if you are dehydrated you can get headaches Eat small frequent meals and snacks, sometimes if you are hungry you can get headaches Sometimes you get headaches during pregnancy from the pregnancy hormones You can try tylenol  (1-2 regular strength 325mg  or 1-2 extra strength 500mg ) as directed on the box. The least amount of medication that works is best.  Cool compresses (cool wet washcloth or ice pack) to area of head that is hurting You can also try drinking a caffeinated drink to see if this will help If not helping, try below:  For Prevention of Headaches/Migraines: CoQ10 100mg  three times daily Vitamin B2 400mg  daily Magnesium Oxide 400-600mg  daily  Foods to alleviate migraines:  1)  dark leafy greens 2) avocado 3) tuna 4) salmon  5) beans and legumes  Foods to avoid: 1) Excessive (or irregular timing) coffee 3) aged cheeses 4) chocolate 5) citrus fruits 6) aspartame and other artifical sweeteners 7) yeast 8) MSG (in processed foods) 9) processed and cured meats 10) nuts and certain seeds 11) chicken livers and other organ meats 12) dairy products like buttermilk, sour cream, and yogurt 13) dried fruits like dates, figs, and raisins 14) garlic 15) onions 16) potato chips 17) pickled foods like olives and sauerkraut 18) some fresh fruits like ripe banana, papaya, red plums, raspberries, kiwi, pineapple 19) tomato-based products  Recommend to keep a migraine diary: rate daily the severity of your headache (1-10) and what foods you eat that day to help determine patterns.   If You Get a Bad Headache/Migraine: Benadryl  25mg   Magnesium Oxide 1 large Gatorade 2 extra strength Tylenol  (1,000mg   total) 1 cup coffee or Coke      If this doesn't help please call us  @ [phone]

## 2024-03-13 NOTE — Progress Notes (Signed)
 HIGH-RISK PREGNANCY VISIT Patient name: Dominique Garza MRN [id number]  Date of birth: 1985-07-31 Chief Complaint:   Routine Prenatal Visit  History of Present Illness:   [name] is a 38 y.o. H3E6885 female at [redacted]w[redacted]d with an Estimated Date of Delivery: 04/07/24 being seen today for ongoing management of a high-risk pregnancy complicated by PG BMI 43.    Today she reports occasional contractions, both sides of belly sore. Some headaches, occ sees floaters, home bp's bid all wnl.  Contractions: Not present. Vag. Bleeding: None.  Movement: Present. denies leaking of fluid.      03/06/2024   10:35 AM 03/01/2024   10:30 AM 02/28/2024   10:11 AM 02/23/2024    9:12 AM 10/05/2023   10:16 AM  Depression screen PHQ 2/9  Decreased Interest 0 0 0 0 1  Down, Depressed, Hopeless 0 0 0 0 0  PHQ - 2 Score 0 0 0 0 1  Altered sleeping     1  Tired, decreased energy     1  Change in appetite     0  Feeling bad or failure about yourself      0  Trouble concentrating     0  Moving slowly or fidgety/restless     0  Suicidal thoughts     0  PHQ-9 Score     3        10/05/2023   10:16 AM 08/13/2023    1:17 PM 02/24/2021    9:55 AM  GAD 7 : Generalized Anxiety Score  Nervous, Anxious, on Edge 0 0 0  Control/stop worrying 0 0 0  Worry too much - different things 0 0 0  Trouble relaxing 0 0 0  Restless 0 0 0  Easily annoyed or irritable 0 0 0  Afraid - awful might happen 0 0 0  Total GAD 7 Score 0 0 0  Anxiety Difficulty  Not difficult at all      Review of Systems:   Pertinent items are noted in HPI Denies abnormal vaginal discharge w/ itching/odor/irritation, headaches, visual changes, shortness of breath, chest pain, abdominal pain, severe nausea/vomiting, or problems with urination or bowel movements unless otherwise stated above. Pertinent History Reviewed:  Reviewed past medical,surgical, social, obstetrical and family history.  Reviewed problem list, medications and  allergies. Physical Assessment:   Vitals:   03/13/24 1040  BP: 120/81  Pulse: (!) 106  Weight: 263 lb (119.3 kg)  Body mass index is 44.45 kg/m.           Physical Examination:   General appearance: alert, well appearing, and in no distress  Mental status: alert, oriented to person, place, and time  Skin: warm & dry   Extremities:      Cardiovascular: normal heart rate noted  Respiratory: normal respiratory effort, no distress  Abdomen: gravid, soft, non-tender  Pelvic: Cervical exam deferred         Fetal Status:     Movement: Present    Fetal Surveillance Testing today: NST: FHR baseline 125 bpm, Variability: moderate, Accelerations:present, Decelerations:  Absent= Cat 1/reactive Toco: irregular    Chaperone: N/A  No results found for this or any previous visit (from the past 24 hours).  Assessment & Plan:  High-risk pregnancy: H3E6885 at [redacted]w[redacted]d with an Estimated Date of Delivery: 04/07/24   1) PGBMI 43, currently 44  2) H/O FGR x 3, EFW 44% at 34w  3) H/O pre-e> ASA, continue checking home bp's bid, if >  140/90 or pre-e sx let us  know  4) Recent UTI> s/p tx,  urine cx poc today  Meds: No orders of the defined types were placed in this encounter.   Labs/procedures today: NST, urine cx  Treatment Plan:  EFW q4w    2x/wk nst or weekly BPP    Deliver @ 39-40.6wks   Reviewed: Preterm labor symptoms and general obstetric precautions including but not limited to vaginal bleeding, contractions, leaking of fluid and fetal movement were reviewed in detail with the patient.  All questions were answered. Does have home bp cuff. Office bp cuff given: not applicable. Check bp twice daily, let us  know if consistently >140 and/or >90.  Follow-up: Return for As scheduled.   Future Appointments  Date Time Provider Department Center  03/16/2024  9:10 AM [facility]FT[facility]GYN NURSE [facility]  03/17/2024  1:00 PM Bertrum Rosina HERO, RN CHL-POPH None  03/20/2024  9:10 AM [facility]FT[facility]N NURSE  [facility]  03/20/2024  9:30 AM Jayne Vonn DEL, MD [facility]  03/23/2024  9:10 AM [facility] NURSE [facility]  03/27/2024  8:30 AM [facility] - FTOBGYN US  CWH-FTIMG None  03/27/2024  9:30 AM Jayne Vonn DEL, MD [facility]  04/03/2024  9:10 AM [facility] NURSE [facility]  04/03/2024  9:30 AM Jayne Vonn DEL, MD [facility]-FT [facility]  04/10/2024  9:10 AM [facility] NURSE [facility]    Orders Placed This Encounter  Procedures   Urine Culture   Suzen JONELLE Fetters CNM, [facility] 03/13/2024 11:24 AM

## 2024-03-15 ENCOUNTER — Ambulatory Visit: Payer: Self-pay | Admitting: Women's Health

## 2024-03-15 DIAGNOSIS — R8271 Bacteriuria: Secondary | ICD-10-CM

## 2024-03-15 LAB — URINE CULTURE

## 2024-03-16 ENCOUNTER — Other Ambulatory Visit

## 2024-03-16 ENCOUNTER — Ambulatory Visit

## 2024-03-16 DIAGNOSIS — Z3A36 36 weeks gestation of pregnancy: Secondary | ICD-10-CM | POA: Diagnosis not present

## 2024-03-16 DIAGNOSIS — O99212 Obesity complicating pregnancy, second trimester: Secondary | ICD-10-CM | POA: Diagnosis not present

## 2024-03-16 NOTE — Progress Notes (Signed)
   NURSE VISIT- NST  SUBJECTIVE:  Dominique Garza is a 38 y.o. H3E6885 female at [redacted]w[redacted]d, here for a NST for pregnancy complicated by Morbid obesity (BMI >=40).  She reports active fetal movement, contractions: none, vaginal bleeding: none, membranes: intact.   OBJECTIVE:  BP 115/76   Pulse 80   Wt 261 lb (118.4 kg)   BMI 44.11 kg/m   Appears well, no apparent distress  No results found for this or any previous visit (from the past 24 hours).  NST: FHR baseline 125 bpm, Variability: moderate, Accelerations:present, Decelerations:  Absent= Cat 1/reactive Toco: none   ASSESSMENT: H3E6885 at [redacted]w[redacted]d with Morbid obesity (BMI >=40) NST reactive  PLAN: EFM strip reviewed by Dr. Ozan   Recommendations: keep next appointment as scheduled    Aleck FORBES Blase  03/16/2024 3:49 PM

## 2024-03-17 ENCOUNTER — Other Ambulatory Visit: Payer: Self-pay | Admitting: *Deleted

## 2024-03-17 VITALS — BP 121/72 | HR 80

## 2024-03-17 DIAGNOSIS — Z59819 Housing instability, housed unspecified: Secondary | ICD-10-CM

## 2024-03-17 NOTE — Patient Outreach (Signed)
 Complex Care Management   Visit Note  03/17/2024  Name:  Dominique Garza MRN: [id number] DOB: 10/28/1985  Situation: Referral received for Complex Care Management related to Health Plan Referral I obtained verbal consent from Patient.  Visit completed with Patient  on the phone  Background:   Past Medical History:  Diagnosis Date   Acne 12/27/2014   Anemia    Anxiety    Fibromyalgia    Hives of unknown origin 12/27/2014   HSV-2 seropositive    Ovarian cyst    Pregnancy induced hypertension    UTI (urinary tract infection)     Assessment: Patient Reported Symptoms:  Cognitive Cognitive Status: No symptoms reported Cognitive/Intellectual Conditions Management [RPT]: None reported or documented in medical history or problem list   Health Maintenance Behaviors: Annual physical exam Healing Pattern: Average Health Facilitated by: Rest  Neurological Neurological Review of Symptoms: No symptoms reported Neurological Management Strategies: Routine screening Neurological Self-Management Outcome: 4 (good)  HEENT HEENT Symptoms Reported: No symptoms reported HEENT Self-Management Outcome: 4 (good)    Cardiovascular Cardiovascular Symptoms Reported: No symptoms reported Does patient have uncontrolled Hypertension?: No Cardiovascular Management Strategies: Routine screening Cardiovascular Self-Management Outcome: 4 (good)  Respiratory Respiratory Symptoms Reported: No symptoms reported Respiratory Management Strategies: Routine screening Respiratory Self-Management Outcome: 4 (good)  Endocrine Endocrine Symptoms Reported: No symptoms reported Is patient diabetic?: No Endocrine Self-Management Outcome: 4 (good)  Gastrointestinal Gastrointestinal Symptoms Reported: No symptoms reported Gastrointestinal Self-Management Outcome: 4 (good)    Genitourinary Genitourinary Symptoms Reported: No symptoms reported Genitourinary Self-Management Outcome: 4 (good)  Integumentary Integumentary  Symptoms Reported: No symptoms reported Skin Self-Management Outcome: 4 (good)  Musculoskeletal Musculoskelatal Symptoms Reviewed: Other Other Musculoskeletal Symptoms: Pelvic Pressure - contractions. Not strong but more frequent Musculoskeletal Management Strategies: Coping strategies Musculoskeletal Self-Management Outcome: 4 (good) Falls in the past year?: No Number of falls in past year: 1 or less Was there an injury with Fall?: No Fall Risk Category Calculator: 0 Patient Fall Risk Level: Low Fall Risk Patient at Risk for Falls Due to: No Fall Risks Fall risk Follow up: Falls evaluation completed  Psychosocial Psychosocial Symptoms Reported: No symptoms reported Behavioral Management Strategies: Support system Behavioral Health Self-Management Outcome: 4 (good) Major Change/Loss/Stressor/Fears (CP): Denies Techniques to Cope with Loss/Stress/Change: Not applicable Quality of Family Relationships: supportive, helpful Do you feel physically threatened by others?: No    03/17/2024    PHQ2-9 Depression Screening   Little interest or pleasure in doing things Not at all  Feeling down, depressed, or hopeless Not at all  PHQ-2 - Total Score 0  Trouble falling or staying asleep, or sleeping too much    Feeling tired or having little energy    Poor appetite or overeating     Feeling bad about yourself - or that you are a failure or have let yourself or your family down    Trouble concentrating on things, such as reading the newspaper or watching television    Moving or speaking so slowly that other people could have noticed.  Or the opposite - being so fidgety or restless that you have been moving around a lot more than usual    Thoughts that you would be better off dead, or hurting yourself in some way    PHQ2-9 Total Score    If you checked off any problems, how difficult have these problems made it for you to do your work, take care of things at home, or get along with other people  Depression Interventions/Treatment      Vitals:   03/17/24 1316  BP: 121/72  Pulse: 80    Medications Reviewed Today     Reviewed by Bertrum Rosina HERO, RN (Registered Nurse) on 03/17/24 at 1310  Med List Status: <None>   Medication Order Taking? Sig Documenting Provider Last Dose Status Informant  acyclovir  (ZOVIRAX ) 400 MG tablet [id number] Yes Take 1 tablet (400 mg total) by mouth 3 (three) times daily. Kizzie Suzen [place], [place]  Active   aspirin  EC 81 MG tablet [id number] Yes Take 2 tablets (162 mg total) by mouth daily. Swallow whole. Kizzie Suzen [place], [place]  Active   Blood Pressure Monitor MISC [id number] Yes For regular home bp monitoring during pregnancy Kizzie Suzen SAUN[place]ERS, CNM  Active   cefadroxil  (DURICEF) 500 MG capsule [id number]  Take 1 capsule (500 mg total) by mouth 2 (two) times daily.  Patient not taking: Reported on 03/17/2024   Delores Nidia CROME, FNP  Consider Medication Status and Discontinue   ferrous sulfate  325 (65 FE) MG tablet [id number] Yes Take 1 tablet (325 mg total) by mouth every other day. Kizzie Suzen SAUN[place], CNM  Active   fluconazole  (DIFLUCAN ) 150 MG tablet [id number]  Take 150 mg by mouth once.  Patient not taking: Reported on 03/17/2024   [provider]  Consider Medication Status and Discontinue   Prenatal Vit-Fe Fumarate-FA (PRENATAL VITAMINS PLUS PO) [id number] Yes  [provider]  Active   UNABLE TO FIND [id number] Yes Iron blood builder-daily [provider]  Active   UNABLE TO FIND [id number]  Emergency Vit C-packet once or twice a week  Patient not taking: Reported on 03/17/2024   [provider]  Consider Medication Status and Discontinue             Recommendation:   Continue Current Plan of Care  Follow Up Plan:   Telephone follow-up in 1 month  Rosina Bertrum, BSN RN [facility], [place] [facility] RN Care Manager Direct Dial: [phone]  Fax: [phone]

## 2024-03-17 NOTE — Patient Instructions (Signed)
 Visit Information  Dominique Garza was given information about Medicaid Managed Care team care coordination services as a part of their Healthy [facility] Medicaid benefit. Dominique Garza   If you would like to schedule transportation through your Healthy [facility] plan, please call the following number at least 2 days in advance of your appointment: [phone]  For information about your ride after you set it up, call Ride Assist at [phone]. Use this number to activate a Will Call pickup, or if your transportation is late for a scheduled pickup. Use this number, too, if you need to make a change or cancel a previously scheduled reservation.  If you need transportation services right away, call [phone]. The after-hours call center is staffed 24 hours to handle ride assistance and urgent reservation requests (including discharges) 365 days a year. Urgent trips include sick visits, hospital discharge requests and life-sustaining treatment.  Call the [facility] Line at [phone], at any time, 24 hours a day, 7 days a week. If you are in danger or need immediate medical attention call 911.   Please see education materials related to Pregnancy  provided by MyChart link.  Patient verbalizes understanding of instructions and care plan provided today and agrees to view in MyChart. Active MyChart status and patient understanding of how to access instructions and care plan via MyChart confirmed with patient.     No further follow up required: Declined to enroll - pending delivery  Dominique Garza, BSN RN [facility], [facility] Health RN Care Manager Direct Dial: [phone]  Fax: [phone]   Following is a copy of your plan of care:  There are no care plans that you recently modified to display for this patient.

## 2024-03-20 ENCOUNTER — Ambulatory Visit: Admitting: Women's Health

## 2024-03-20 ENCOUNTER — Encounter: Payer: Self-pay | Admitting: Women's Health

## 2024-03-20 ENCOUNTER — Other Ambulatory Visit

## 2024-03-20 VITALS — BP 132/85 | HR 80 | Wt 263.8 lb

## 2024-03-20 DIAGNOSIS — Z8759 Personal history of other complications of pregnancy, childbirth and the puerperium: Secondary | ICD-10-CM | POA: Diagnosis not present

## 2024-03-20 DIAGNOSIS — Z3A37 37 weeks gestation of pregnancy: Secondary | ICD-10-CM

## 2024-03-20 DIAGNOSIS — O0990 Supervision of high risk pregnancy, unspecified, unspecified trimester: Secondary | ICD-10-CM

## 2024-03-20 DIAGNOSIS — Z331 Pregnant state, incidental: Secondary | ICD-10-CM

## 2024-03-20 DIAGNOSIS — O0993 Supervision of high risk pregnancy, unspecified, third trimester: Secondary | ICD-10-CM | POA: Diagnosis not present

## 2024-03-20 DIAGNOSIS — Z1389 Encounter for screening for other disorder: Secondary | ICD-10-CM

## 2024-03-20 LAB — POCT URINALYSIS DIPSTICK OB
Glucose, UA: NEGATIVE
Ketones, UA: NEGATIVE
Nitrite, UA: NEGATIVE

## 2024-03-20 NOTE — Progress Notes (Signed)
 HIGH-RISK PREGNANCY VISIT Patient name: Dominique Garza MRN [id number]  Date of birth: 1985-09-02 Chief Complaint:   Routine Prenatal Visit and Non-stress Test  History of Present Illness:   ANTONINETTE Garza is a 38 y.o. H3E6885 female at [redacted]w[redacted]d with an Estimated Date of Delivery: 04/07/24 being seen today for ongoing management of a high-risk pregnancy complicated by PG BMI 43.    Today she reports checks home bp's bid, all wnl, checked this am and was 120s/70s. Denies ha, ruq/epigastric pain, n/v. Does see squiggly lines sometimes- but this has ben for awhile, no changes recently.   Contractions: Irregular.  .  Movement: Present. denies leaking of fluid.      03/17/2024    1:19 PM 03/06/2024   10:35 AM 03/01/2024   10:30 AM 02/28/2024   10:11 AM 02/23/2024    9:12 AM  Depression screen PHQ 2/9  Decreased Interest 0 0 0 0 0  Down, Depressed, Hopeless 0 0 0 0 0  PHQ - 2 Score 0 0 0 0 0        10/05/2023   10:16 AM 08/13/2023    1:17 PM 02/24/2021    9:55 AM  GAD 7 : Generalized Anxiety Score  Nervous, Anxious, on Edge 0 0 0  Control/stop worrying 0 0 0  Worry too much - different things 0 0 0  Trouble relaxing 0 0 0  Restless 0 0 0  Easily annoyed or irritable 0 0 0  Afraid - awful might happen 0 0 0  Total GAD 7 Score 0 0 0  Anxiety Difficulty  Not difficult at all      Review of Systems:   Pertinent items are noted in HPI Denies abnormal vaginal discharge w/ itching/odor/irritation, headaches, visual changes, shortness of breath, chest pain, abdominal pain, severe nausea/vomiting, or problems with urination or bowel movements unless otherwise stated above. Pertinent History Reviewed:  Reviewed past medical,surgical, social, obstetrical and family history.  Reviewed problem list, medications and allergies. Physical Assessment:   Vitals:   03/20/24 0922  BP: 132/85  Pulse: 80  Weight: 263 lb 12.8 oz (119.7 kg)  Body mass index is 44.58 kg/m.           Physical  Examination:   General appearance: alert, well appearing, and in no distress  Mental status: alert, oriented to person, place, and time  Skin: warm & dry   Extremities: Edema: None    Cardiovascular: normal heart rate noted  Respiratory: normal respiratory effort, no distress  Abdomen: gravid, soft, non-tender  Pelvic: Cervical exam deferred         Fetal Status:     Movement: Present    Fetal Surveillance Testing today: NST: FHR baseline 120 bpm, Variability: moderate, Accelerations:present, Decelerations:  Absent= Cat 1/reactive Toco: irregular    Chaperone: N/A  Results for orders placed or performed in visit on 03/20/24 (from the past 24 hours)  POC Urinalysis Dipstick OB   Collection Time: 03/20/24  9:50 AM  Result Value Ref Range   Color, UA     Clarity, UA     Glucose, UA Negative Negative   Bilirubin, UA     Ketones, UA negative    Spec Grav, UA     Blood, UA trace    pH, UA     POC,PROTEIN,UA Small (1+) Negative, Trace, Small (1+), Moderate (2+), Large (3+), 4+   Urobilinogen, UA     Nitrite, UA negative    Leukocytes, UA Small (1+) (  A) Negative   Appearance     Odor      Assessment & Plan:  High-risk pregnancy: H3E6885 at [redacted]w[redacted]d with an Estimated Date of Delivery: 04/07/24   1) PGBMI 43, currently 44  2) H/O pre-e, ASA, bp borderline here today w/ 1+ protein, normal home bp's bid (120s/70s this am at home), asymptomatic. Continue checking bid, if >140/90 or pre-e sx let us  know/go to wCC  3) H/O FGR x 3> last EFW 44% @ 34w, repeat next week  Meds: No orders of the defined types were placed in this encounter.   Labs/procedures today: NST  Treatment Plan:  EFW q4w    2x/wk nst or weekly BPP   Deliver @ 39-40.6wks   Reviewed: Preterm labor symptoms and general obstetric precautions including but not limited to vaginal bleeding, contractions, leaking of fluid and fetal movement were reviewed in detail with the patient.  All questions were answered. Does have  home bp cuff. Office bp cuff given: not applicable. Check bp twice daily, let us  know if consistently >140 and/or >90.  Follow-up: Return for As scheduled.   Future Appointments  Date Time Provider Department Center  03/21/2024  1:30 PM Clemons, Tillman B [facility] None  03/23/2024  9:10 AM [facility] NURSE [facility]  03/27/2024  8:30 AM [facility] - [facility] US  [facility]FT[facility]G None  03/27/2024  9:30 AM Jayne Vonn DEL, MD [facility]  04/03/2024  9:10 AM [facility] NURSE [facility]  04/03/2024  9:30 AM Jayne Vonn DEL, MD [facility]  04/10/2024  9:10 AM [facility] NURSE [facility]    Orders Placed This Encounter  Procedures   POC Urinalysis Dipstick OB   Suzen JONELLE Fetters CNM, [facility] 03/20/2024 10:06 AM

## 2024-03-20 NOTE — Patient Instructions (Signed)
 Dominique Garza, thank you for choosing our office today! We appreciate the opportunity to meet your healthcare needs. You may receive a short survey by mail, e-mail, or through [employer]. If you are happy with your care we would appreciate if you could take just a few minutes to complete the survey questions. We read all of your comments and take your feedback very seriously. Thank you again for choosing our office.  Center for [employer] Team at [facility]  [facility] at [facility] ([address] [place], [place] [address]) Entrance C, located off of [address] Free 24/7 valet parking   CLASSES: Go to [email or web address] to register for classes (childbirth, breastfeeding, waterbirth, infant CPR, daddy bootcamp, etc.)  Call the office [phone]) or go to [facility] if: You begin to have strong, frequent contractions Your water breaks.  Sometimes it is a big gush of fluid, sometimes it is just a trickle that keeps getting your panties wet or running down your legs You have vaginal bleeding.  It is normal to have a small amount of spotting if your cervix was checked.  You don't feel your baby moving like normal.  If you don't, get you something to eat and drink and lay down and focus on feeling your baby move.   If your baby is still not moving like normal, you should call the office or go to [facility].  Call the office [phone]) or go to [facility] hospital for these signs of pre-eclampsia: Severe headache that does not go away with Tylenol  Visual changes- seeing spots, double, blurred vision Pain under your right breast or upper abdomen that does not go away with Tums or heartburn medicine Nausea and/or vomiting Severe swelling in your hands, feet, and face   [place] Pediatricians/Family Doctors [facility]): [address] [place]uba [place], [phone]           [facility]: [address], [phone]                 [facility]): [address], [phone] (call to ask if accepting patients) [facility] Department: [address] [place], [phone]    [facility] Pediatricians/Family Doctors [facility]): [address], [phone] [facility]: [address] [place], [phone] [address] [facility]: [address], [phone]  [facility] Doctors  [facility]): [phone] [facility]: [address] [phone]   [facility] Doctors [facility]: [address] [phone]  [facility] Doctors  [facility]: [phone], [phone]  Home Blood Pressure Monitoring for Patients   Your provider has recommended that you check your blood pressure (BP) at least once a week at home. If you do not have a blood pressure cuff at home, one will be provided for you. Contact your provider if you have not received your monitor within 1 week.   Helpful Tips for Accurate Home Blood Pressure Checks  Don't smoke, exercise, or drink caffeine 30 minutes before checking your BP Use the restroom before checking your BP (a full bladder can raise your pressure) Relax in a comfortable upright chair Feet on the ground Left arm resting comfortably on a flat surface at the level of your heart Legs uncrossed Back supported Sit quietly and don't talk Place the cuff on your bare arm Adjust snuggly, so that only two fingertips  can fit between your skin and the top of the cuff Check 2 readings separated by at least one minute Keep a log of your BP readings For a visual, please reference this diagram: http://ccnc.care/bpdiagram  Provider Name: [facility]     Phone: [phone]  Zone 1: ALL CLEAR  Continue to monitor your symptoms:  BP reading is less than 140 (top number) or less than 90 (bottom number)  No right  upper stomach pain No headaches or seeing spots No feeling nauseated or throwing up No swelling in face and hands  Zone 2: CAUTION Call your doctor's office for any of the following:  BP reading is greater than 140 (top number) or greater than 90 (bottom number)  Stomach pain under your ribs in the middle or right side Headaches or seeing spots Feeling nauseated or throwing up Swelling in face and hands  Zone 3: EMERGENCY  Seek immediate medical care if you have any of the following:  BP reading is greater than160 (top number) or greater than 110 (bottom number) Severe headaches not improving with Tylenol  Serious difficulty catching your breath Any worsening symptoms from Zone 2   Braxton Hicks Contractions Contractions of the uterus can occur throughout pregnancy, but they are not always a sign that you are in labor. You may have practice contractions called Braxton Hicks contractions. These false labor contractions are sometimes confused with true labor. What are Darol Irving contractions? Braxton Hicks contractions are tightening movements that occur in the muscles of the uterus before labor. Unlike true labor contractions, these contractions do not result in opening (dilation) and thinning of the cervix. Toward the end of pregnancy (32-34 weeks), Braxton Hicks contractions can happen more often and may become stronger. These contractions are sometimes difficult to tell apart from true labor because they can be very uncomfortable. You should not feel embarrassed if you go to the hospital with false labor. Sometimes, the only way to tell if you are in true labor is for your health care provider to look for changes in the cervix. The health care provider will do a physical exam and may monitor your contractions. If you are not in true labor, the exam should show that your cervix is not dilating and your water has not broken. If there are no other health problems associated with your  pregnancy, it is completely safe for you to be sent home with false labor. You may continue to have Braxton Hicks contractions until you go into true labor. How to tell the difference between true labor and false labor True labor Contractions last 30-70 seconds. Contractions become very regular. Discomfort is usually felt in the top of the uterus, and it spreads to the lower abdomen and low back. Contractions do not go away with walking. Contractions usually become more intense and increase in frequency. The cervix dilates and gets thinner. False labor Contractions are usually shorter and not as strong as true labor contractions. Contractions are usually irregular. Contractions are often felt in the front of the lower abdomen and in the groin. Contractions may go away when you walk around or change positions while lying down. Contractions get weaker and are shorter-lasting as time goes on. The cervix usually does not dilate or become thin. Follow these instructions at home:  Take over-the-counter and prescription medicines only as told by your health care provider. Keep up with your usual exercises and follow other instructions from your health care provider. Eat and drink lightly if you think  you are going into labor. If Braxton Hicks contractions are making you uncomfortable: Change your position from lying down or resting to walking, or change from walking to resting. Sit and rest in a tub of warm water. Drink enough fluid to keep your urine pale yellow. Dehydration may cause these contractions. Do slow and deep breathing several times an hour. Keep all follow-up prenatal visits as told by your health care provider. This is important. Contact a health care provider if: You have a fever. You have continuous pain in your abdomen. Get help right away if: Your contractions become stronger, more regular, and closer together. You have fluid leaking or gushing from your vagina. You pass  blood-tinged mucus (bloody show). You have bleeding from your vagina. You have low back pain that you never had before. You feel your baby's head pushing down and causing pelvic pressure. Your baby is not moving inside you as much as it used to. Summary Contractions that occur before labor are called Braxton Hicks contractions, false labor, or practice contractions. Braxton Hicks contractions are usually shorter, weaker, farther apart, and less regular than true labor contractions. True labor contractions usually become progressively stronger and regular, and they become more frequent. Manage discomfort from [facility] contractions by changing position, resting in a warm bath, drinking plenty of water, or practicing deep breathing. This information is not intended to replace advice given to you by your health care provider. Make sure you discuss any questions you have with your health care provider. Document Revised: 04/09/2017 Document Reviewed: 09/10/2016 Elsevier Patient Education  2020 [employer].

## 2024-03-21 ENCOUNTER — Inpatient Hospital Stay (HOSPITAL_COMMUNITY): Admitting: Anesthesiology

## 2024-03-21 ENCOUNTER — Inpatient Hospital Stay (HOSPITAL_COMMUNITY)
Admission: AD | Admit: 2024-03-21 | Discharge: 2024-03-23 | DRG: 806 | Disposition: A | Discharge status: Home / Self Care | Attending: Family Medicine | Admitting: Family Medicine

## 2024-03-21 ENCOUNTER — Telehealth: Payer: Self-pay

## 2024-03-21 ENCOUNTER — Other Ambulatory Visit: Payer: Self-pay

## 2024-03-21 ENCOUNTER — Encounter (HOSPITAL_COMMUNITY): Payer: Self-pay | Admitting: Obstetrics & Gynecology

## 2024-03-21 DIAGNOSIS — D696 Thrombocytopenia, unspecified: Secondary | ICD-10-CM | POA: Diagnosis not present

## 2024-03-21 DIAGNOSIS — O09299 Supervision of pregnancy with other poor reproductive or obstetric history, unspecified trimester: Secondary | ICD-10-CM

## 2024-03-21 DIAGNOSIS — O134 Gestational [pregnancy-induced] hypertension without significant proteinuria, complicating childbirth: Secondary | ICD-10-CM | POA: Diagnosis not present

## 2024-03-21 DIAGNOSIS — A6000 Herpesviral infection of urogenital system, unspecified: Secondary | ICD-10-CM | POA: Diagnosis present

## 2024-03-21 DIAGNOSIS — O0990 Supervision of high risk pregnancy, unspecified, unspecified trimester: Principal | ICD-10-CM

## 2024-03-21 DIAGNOSIS — O9912 Other diseases of the blood and blood-forming organs and certain disorders involving the immune mechanism complicating childbirth: Secondary | ICD-10-CM | POA: Diagnosis not present

## 2024-03-21 DIAGNOSIS — O99214 Obesity complicating childbirth: Secondary | ICD-10-CM | POA: Diagnosis present

## 2024-03-21 DIAGNOSIS — O9902 Anemia complicating childbirth: Secondary | ICD-10-CM | POA: Diagnosis not present

## 2024-03-21 DIAGNOSIS — E66813 Obesity, class 3: Secondary | ICD-10-CM | POA: Diagnosis not present

## 2024-03-21 DIAGNOSIS — Z7982 Long term (current) use of aspirin: Secondary | ICD-10-CM | POA: Diagnosis not present

## 2024-03-21 DIAGNOSIS — O9832 Other infections with a predominantly sexual mode of transmission complicating childbirth: Secondary | ICD-10-CM | POA: Diagnosis present

## 2024-03-21 DIAGNOSIS — Z6841 Body Mass Index (BMI) 40.0 and over, adult: Secondary | ICD-10-CM

## 2024-03-21 DIAGNOSIS — O09529 Supervision of elderly multigravida, unspecified trimester: Secondary | ICD-10-CM

## 2024-03-21 DIAGNOSIS — Z3A37 37 weeks gestation of pregnancy: Secondary | ICD-10-CM

## 2024-03-21 DIAGNOSIS — Z8759 Personal history of other complications of pregnancy, childbirth and the puerperium: Secondary | ICD-10-CM | POA: Diagnosis present

## 2024-03-21 DIAGNOSIS — Z3A00 Weeks of gestation of pregnancy not specified: Secondary | ICD-10-CM | POA: Diagnosis not present

## 2024-03-21 DIAGNOSIS — O99824 Streptococcus B carrier state complicating childbirth: Secondary | ICD-10-CM | POA: Diagnosis present

## 2024-03-21 DIAGNOSIS — Z833 Family history of diabetes mellitus: Secondary | ICD-10-CM

## 2024-03-21 DIAGNOSIS — O99013 Anemia complicating pregnancy, third trimester: Secondary | ICD-10-CM | POA: Diagnosis present

## 2024-03-21 DIAGNOSIS — Z87891 Personal history of nicotine dependence: Secondary | ICD-10-CM | POA: Diagnosis not present

## 2024-03-21 DIAGNOSIS — Z8249 Family history of ischemic heart disease and other diseases of the circulatory system: Secondary | ICD-10-CM | POA: Diagnosis not present

## 2024-03-21 DIAGNOSIS — O139 Gestational [pregnancy-induced] hypertension without significant proteinuria, unspecified trimester: Secondary | ICD-10-CM | POA: Diagnosis present

## 2024-03-21 LAB — COMPREHENSIVE METABOLIC PANEL WITH GFR
ALT: 47 U/L — ABNORMAL HIGH (ref 0–44)
ALT: 44 U/L (ref 0–44)
AST: 30 U/L (ref 15–41)
AST: 31 U/L (ref 15–41)
Albumin: 3 g/dL — ABNORMAL LOW (ref 3.5–5.0)
Albumin: 3 g/dL — ABNORMAL LOW (ref 3.5–5.0)
Alkaline Phosphatase: 102 U/L (ref 38–126)
Alkaline Phosphatase: 101 U/L (ref 38–126)
Anion gap: 10 (ref 5–15)
Anion gap: 10 (ref 5–15)
BUN: 7 mg/dL (ref 6–20)
BUN: 6 mg/dL (ref 6–20)
CO2: 22 mmol/L (ref 22–32)
CO2: 21 mmol/L — ABNORMAL LOW (ref 22–32)
Calcium: 9.8 mg/dL (ref 8.9–10.3)
Calcium: 9.1 mg/dL (ref 8.9–10.3)
Chloride: 102 mmol/L (ref 98–111)
Chloride: 102 mmol/L (ref 98–111)
Creatinine, Ser: 0.85 mg/dL (ref 0.44–1.00)
Creatinine, Ser: 0.78 mg/dL (ref 0.44–1.00)
GFR, Estimated: >60 mL/min (ref >60)
GFR, Estimated: >60 mL/min (ref >60)
Glucose, Bld: 108 mg/dL — ABNORMAL HIGH (ref 70–99)
Glucose, Bld: 98 mg/dL (ref 70–99)
Potassium: 3.7 mmol/L (ref 3.5–5.1)
Potassium: 3.7 mmol/L (ref 3.5–5.1)
Sodium: 134 mmol/L — ABNORMAL LOW (ref 135–145)
Sodium: 133 mmol/L — ABNORMAL LOW (ref 135–145)
Total Bilirubin: 0.3 mg/dL (ref 0.0–1.2)
Total Bilirubin: <0.2 mg/dL (ref 0.0–1.2)
Total Protein: 6.6 g/dL (ref 6.5–8.1)
Total Protein: 6.7 g/dL (ref 6.5–8.1)

## 2024-03-21 LAB — CBC
HCT: 30.1 % — ABNORMAL LOW (ref 36.0–46.0)
HCT: 30.2 % — ABNORMAL LOW (ref 36.0–46.0)
Hemoglobin: 10.5 g/dL — ABNORMAL LOW (ref 12.0–15.0)
Hemoglobin: 10.4 g/dL — ABNORMAL LOW (ref 12.0–15.0)
MCH: 31.4 pg (ref 26.0–34.0)
MCH: 31.1 pg (ref 26.0–34.0)
MCHC: 34.9 g/dL (ref 30.0–36.0)
MCHC: 34.4 g/dL (ref 30.0–36.0)
MCV: 90.1 fL (ref 80.0–100.0)
MCV: 90.4 fL (ref 80.0–100.0)
Platelets: 141 K/uL — ABNORMAL LOW (ref 150–400)
Platelets: 139 K/uL — ABNORMAL LOW (ref 150–400)
RBC: 3.34 MIL/uL — ABNORMAL LOW (ref 3.87–5.11)
RBC: 3.34 MIL/uL — ABNORMAL LOW (ref 3.87–5.11)
RDW: 14 % (ref 11.5–15.5)
RDW: 14 % (ref 11.5–15.5)
WBC: 9.9 K/uL (ref 4.0–10.5)
WBC: 9.7 K/uL (ref 4.0–10.5)
nRBC: 0 % (ref 0.0–0.2)
nRBC: 0 % (ref 0.0–0.2)

## 2024-03-21 LAB — TYPE AND SCREEN
ABO/RH(D): A POS
Antibody Screen: NEGATIVE

## 2024-03-21 LAB — PROTEIN / CREATININE RATIO, URINE
Creatinine, Urine: 56 mg/dL
Protein Creatinine Ratio: 0.13 mg/mg{creat} (ref 0.00–0.15)
Total Protein, Urine: 7 mg/dL

## 2024-03-21 LAB — RPR: RPR Ser Ql: NONREACTIVE

## 2024-03-21 MED ORDER — ONDANSETRON HCL 4 MG/2ML IJ SOLN: 4.0000 mg | Freq: Four times a day (QID) | INTRAMUSCULAR | Status: DC | PRN

## 2024-03-21 MED ORDER — MISOPROSTOL 25 MCG QUARTER TABLET
25.0000 ug | ORAL_TABLET | ORAL | Status: DC | PRN
  Administered 2024-03-21: 25 ug via VAGINAL

## 2024-03-21 MED ORDER — ACETAMINOPHEN 325 MG PO TABS
650.0000 mg | ORAL_TABLET | ORAL | Status: DC | PRN
Start: 2024-03-21 — End: 2024-03-22

## 2024-03-21 MED ORDER — PHENYLEPHRINE 80 MCG/ML (10ML) SYRINGE FOR IV PUSH (FOR BLOOD PRESSURE SUPPORT): 80.0000 ug | PREFILLED_SYRINGE | INTRAVENOUS | Status: DC | PRN

## 2024-03-21 MED ORDER — EPHEDRINE 5 MG/ML INJ: 10.0000 mg | INTRAVENOUS | Status: DC | PRN

## 2024-03-21 MED ORDER — OXYTOCIN-SODIUM CHLORIDE 30-0.9 UT/500ML-% IV SOLN
1.0000 m[IU]/min | INTRAVENOUS | Status: DC
  Administered 2024-03-21: 2 m[IU]/min via INTRAVENOUS
  Filled 2024-03-21: qty 500

## 2024-03-21 MED ORDER — LACTATED RINGERS IV SOLN: 500.0000 mL | Freq: Once | INTRAVENOUS | Status: DC

## 2024-03-21 MED ORDER — ZOLPIDEM TARTRATE 5 MG PO TABS: 5.0000 mg | ORAL_TABLET | Freq: Every evening | ORAL | Status: DC | PRN

## 2024-03-21 MED ORDER — OXYTOCIN BOLUS FROM INFUSION
333.0000 mL | Freq: Once | INTRAVENOUS | Status: AC
  Administered 2024-03-22: 333 mL via INTRAVENOUS

## 2024-03-21 MED ORDER — FENTANYL-BUPIVACAINE-NACL 0.5-0.125-0.9 MG/250ML-% EP SOLN
12.0000 mL/h | EPIDURAL | Status: DC | PRN
  Administered 2024-03-21: 12 mL/h via EPIDURAL
  Filled 2024-03-21: qty 250

## 2024-03-21 MED ORDER — MISOPROSTOL 25 MCG QUARTER TABLET
25.0000 ug | ORAL_TABLET | Freq: Once | ORAL | Status: DC
  Filled 2024-03-21: qty 1

## 2024-03-21 MED ORDER — ACETAMINOPHEN 325 MG PO TABS
650.0000 mg | ORAL_TABLET | Freq: Once | ORAL | Status: AC
  Administered 2024-03-21: 650 mg via ORAL
  Filled 2024-03-21 (×2): qty 2

## 2024-03-21 MED ORDER — EPHEDRINE 5 MG/ML INJ
10.0000 mg | INTRAVENOUS | Status: DC | PRN
Start: 2024-03-21 — End: 2024-03-22

## 2024-03-21 MED ORDER — FENTANYL CITRATE (PF) 100 MCG/2ML IJ SOLN
100.0000 ug | INTRAMUSCULAR | Status: DC | PRN
  Administered 2024-03-21: 100 ug via INTRAVENOUS

## 2024-03-21 MED ORDER — OXYTOCIN-SODIUM CHLORIDE 30-0.9 UT/500ML-% IV SOLN: 2.5000 [IU]/h | INTRAVENOUS | Status: DC

## 2024-03-21 MED ORDER — FENTANYL CITRATE (PF) 100 MCG/2ML IJ SOLN
50.0000 ug | INTRAMUSCULAR | Status: DC | PRN
  Filled 2024-03-21: qty 2

## 2024-03-21 MED ORDER — LACTATED RINGERS IV SOLN: INTRAVENOUS | Status: AC

## 2024-03-21 MED ORDER — DIPHENHYDRAMINE HCL 50 MG/ML IJ SOLN: 12.5000 mg | INTRAMUSCULAR | Status: DC | PRN

## 2024-03-21 MED ORDER — LIDOCAINE HCL (PF) 1 % IJ SOLN: 30.0000 mL | INTRAMUSCULAR | Status: DC | PRN

## 2024-03-21 MED ORDER — OXYTOCIN-SODIUM CHLORIDE 30-0.9 UT/500ML-% IV SOLN: 1.0000 m[IU]/min | INTRAVENOUS | Status: DC

## 2024-03-21 MED ORDER — PENICILLIN G POT IN DEXTROSE 60000 UNIT/ML IV SOLN
3.0000 10*6.[IU] | INTRAVENOUS | Status: DC
  Administered 2024-03-21 – 2024-03-22 (×6): 3 10*6.[IU] via INTRAVENOUS
  Filled 2024-03-21 (×6): qty 50

## 2024-03-21 MED ORDER — LACTATED RINGERS IV SOLN: 500.0000 mL | INTRAVENOUS | Status: AC | PRN

## 2024-03-21 MED ORDER — SODIUM CHLORIDE 0.9 % IV SOLN
5.0000 10*6.[IU] | Freq: Once | INTRAVENOUS | Status: AC
  Administered 2024-03-21: 5 10*6.[IU] via INTRAVENOUS
  Filled 2024-03-21: qty 5

## 2024-03-21 MED ORDER — LIDOCAINE HCL (PF) 1 % IJ SOLN
INTRAMUSCULAR | Status: DC | PRN
  Administered 2024-03-21: 8 mL via EPIDURAL

## 2024-03-21 MED ORDER — TERBUTALINE SULFATE 1 MG/ML IJ SOLN: 0.2500 mg | Freq: Once | INTRAMUSCULAR | Status: DC | PRN

## 2024-03-21 MED ORDER — SOD CITRATE-CITRIC ACID 500-334 MG/5ML PO SOLN
30.0000 mL | ORAL | Status: DC | PRN
Start: 2024-03-21 — End: 2024-03-22

## 2024-03-21 NOTE — Progress Notes (Signed)
 Pt requested EFM off while waiting for epidural.

## 2024-03-21 NOTE — Anesthesia Procedure Notes (Signed)
 Epidural Patient location during procedure: OB Start time: 03/21/2024 8:08 PM End time: 03/21/2024 8:18 PM  Staffing Anesthesiologist: Merla Almarie HERO, DO Performed: anesthesiologist   Preanesthetic Checklist Completed: patient identified, IV checked, risks and benefits discussed, monitors and equipment checked, pre-op evaluation and timeout performed  Epidural Patient position: sitting Prep: DuraPrep and site prepped and draped Patient monitoring: continuous pulse ox, blood pressure, heart rate and cardiac monitor Approach: midline Location: L3-L4 Injection technique: LOR air  Needle:  Needle insertion depth: 6.5 cm Needle type: Tuohy  Needle gauge: 17 G Needle length: 9 cm Needle insertion depth: 6.5 cm Catheter type: closed end flexible Catheter size: 19 Gauge Catheter at skin depth: 12 cm Test dose: negative  Assessment Sensory level: T8 Events: blood not aspirated, no cerebrospinal fluid, injection not painful, no injection resistance, no paresthesia and negative IV test  Additional Notes Patient identified. Risks/Benefits/Options discussed with patient including but not limited to bleeding, infection, nerve damage, paralysis, failed block, incomplete pain control, headache, blood pressure changes, nausea, vomiting, reactions to medication both or allergic, itching and postpartum back pain. Confirmed with bedside nurse the patient's most recent platelet count. Confirmed with patient that they are not currently taking any anticoagulation, have any bleeding history or any family history of bleeding disorders. Patient expressed understanding and wished to proceed. All questions were answered. Sterile technique was used throughout the entire procedure. Please see nursing notes for vital signs. Test dose was given through epidural catheter and negative prior to continuing to dose epidural or start infusion. Warning signs of high block given to the patient including shortness  of breath, tingling/numbness in hands, complete motor block, or any concerning symptoms with instructions to call for help. Patient was given instructions on fall risk and not to get out of bed. All questions and concerns addressed with instructions to call with any issues or inadequate analgesia.  Reason for block:procedure for pain

## 2024-03-21 NOTE — MAU Note (Addendum)
 Pt says she feels some UC's  [facility] - BP has been elevated in office - told to monitor at home- she has been awake all night - 157/107 at 0430. At  0200- 145/95.   No BP meds.  Has slight H/A- started at 2330- 7/10 - so she took Reg Tyl 1 tab . Now H/A-  5/10 Vision- sees floaters - started x1 week - occ.but more since Sat .  Denies epigastric pain.  Has had SOB sometimes

## 2024-03-21 NOTE — Patient Instructions (Signed)
 Vella JINNY Molt - I am sorry I was unable to reach you today for our scheduled appointment. I work with [place], Meade PEDLAR, FNP and am calling to support your healthcare needs. Please contact me at [phone] at your earliest convenience. I look forward to speaking with you soon.   Thank you,  Tillman Gardener, BSW [place]  [facility], [facility] Social Worker Direct Dial: [phone]  Fax: [phone] Website: [email or web address]

## 2024-03-21 NOTE — Progress Notes (Signed)
 Pt educated that bedside US  performed was only to confirm vertex, not an anatomy scan.

## 2024-03-21 NOTE — Progress Notes (Signed)
 Dominique Garza is a 38 y.o. H3E6885 at [redacted]w[redacted]d by ultrasound admitted for [facility].   Subjective: Dominique Garza is  feeling comfortable with an epidural. Foley bulb out at 1902.  Objective: BP 113/65   Pulse 85   Temp 98.1 F (36.7 C) (Oral)   Resp 16   Ht 5' 4.5 (1.638 m)   Wt 120.1 kg   SpO2 100%   BMI 44.73 kg/m  No intake/output data recorded. No intake/output data recorded.  FHT:  FHR: 130 bpm, variability: moderate,  accelerations:  Present,  decelerations:  Absent UC:   regular, every 3-6 minutes SVE:   Dilation: 4 Effacement (%): 70 Station: Ballotable Exam by:: [email or web address]  Labs: Lab Results  Component Value Date   WBC 9.7 03/21/2024   HGB 10.4 (L) 03/21/2024   HCT 30.2 (L) 03/21/2024   MCV 90.4 03/21/2024   PLT 139 (L) 03/21/2024    Assessment / Plan: Induction of labor due to gestational hypertension.  Labor: Foley bulb out, plan to start pitocin , AROM when appropriate.  Fetal Wellbeing:  Category I Pain Control:  Epidural I/D:  GBS pos, Membranes intact Anticipated MOD:  NSVD  Rosina Hamilton, Student-MidWife 03/21/2024, 9:06 PM

## 2024-03-21 NOTE — Progress Notes (Signed)
 Labor Progress Note  (delayed entry note due to patient care) Dominique Garza is a 38 y.o. H3E6885 at [redacted]w[redacted]d presenting for IOL for gHTN  S: Patient feeling uncomfortable with contractions. Agreeable to placement of foley cervical balloon.   Manual placement of FB x2, the second time with pain medication, was unsuccessful. Attempted to place Cook's cathter with speculum, also unsuccessful, but with patient positioned up in stirrups, manual placement of Cook's catheter completed successfully with insertion of 40ml of fluid at 1450.  O:  BP 130/64   Pulse 78   Temp 98.4 F (36.9 C) (Oral)   Resp 16   Ht 5' 4.5 (1.638 m)   Wt 120.1 kg   BMI 44.73 kg/m  Lab Results  Component Value Date   HGB 10.5 (L) 03/21/2024    Time: 1450  FHT: baseline bpm 130, moderate variability, accelerations present, decelerations none,   Contractions: q 2-3 mins,    CVE: Dilation: 2 Effacement (%): 70 Cervical Position: Posterior Station: -3 Presentation: Vertex Exam by:: Karry Causer   A&P: 38 y.o. H3E6885 [redacted]w[redacted]d IOL for gHTN #Labor: Latent Labor Cytotec and IP Foley  #Pain: IV pain meds #FWB: Category I #GBS positive #gHTN-monitor Bps, pre-E labs not meeting criteria but has mild thrombocytopenia and mildly elevated ALT,   Leeroy KATHEE Pouch, MD 4:12 PM

## 2024-03-21 NOTE — Anesthesia Preprocedure Evaluation (Signed)
 Anesthesia Evaluation  Patient identified by MRN, date of birth, ID band Patient awake    Reviewed: Allergy & Precautions, Patient's Chart, lab work & pertinent test results  Airway Mallampati: III  TM Distance: >3 FB Neck ROM: Full    Dental no notable dental hx.    Pulmonary neg pulmonary ROS, former smoker   Pulmonary exam normal breath sounds clear to auscultation       Cardiovascular hypertension (gHTN), Normal cardiovascular exam Rhythm:Regular Rate:Normal     Neuro/Psych  PSYCHIATRIC DISORDERS Anxiety     negative neurological ROS     GI/Hepatic negative GI ROS, Neg liver ROS,,,  Endo/Other    Class 3 obesity (BMI 45)  Renal/GU negative Renal ROS  negative genitourinary   Musculoskeletal  (+)  Fibromyalgia -  Abdominal  (+) + obese  Peds  Hematology  (+) Blood dyscrasia, anemia Hb 10.4, plt 139   Anesthesia Other Findings   Reproductive/Obstetrics negative OB ROS                              Anesthesia Physical Anesthesia Plan  ASA: 3  Anesthesia Plan: Epidural   Post-op Pain Management:    Induction:   PONV Risk Score and Plan: 2  Airway Management Planned: Natural Airway  Additional Equipment: None  Intra-op Plan:   Post-operative Plan:   Informed Consent: I have reviewed the patients History and Physical, chart, labs and discussed the procedure including the risks, benefits and alternatives for the proposed anesthesia with the patient or authorized representative who has indicated his/her understanding and acceptance.       Plan Discussed with:   Anesthesia Plan Comments:         Anesthesia Quick Evaluation

## 2024-03-21 NOTE — MAU Provider Note (Signed)
 Chief Complaint:  Hypertension and Headache   HPI     Dominique Garza is a 38 y.o. H3E6885 at [redacted]w[redacted]d who presents to maternity admissions reporting elevated blood pressures since yesterday. She had an elevated blood pressure at her doctors office appointment. Had an elevated pressure of 132/85 in the office and was told to monitor. Overnight, she was having blood pressures in the 140s-150s/80s-90s with the highest being 157/107 around 4am. Currently has a headache 5/10 on the pain scale, took 325mg  tylenol  at 11pm yesterday. Also describes intermittent floaters. Denies shortness of breath, chest pain, RUQ pain, or leg swelling.   Pregnancy Course: Pregnancy has been complicated by BMI and hx pre-eclampsia.   Past Medical History:  Diagnosis Date   Acne 12/27/2014   Anemia    Anxiety    Fibromyalgia    Hives of unknown origin 12/27/2014   HSV-2 seropositive    Ovarian cyst    Pregnancy induced hypertension    UTI (urinary tract infection)    OB History  Gravida Para Term Preterm AB Living  6 4 3 1 1 4   SAB IAB Ectopic Multiple Live Births  1   0 4    # Outcome Date GA Lbr Len/2nd Weight Sex Type Anes PTL Lv  6 Current           5 Term 09/02/14 [redacted]w[redacted]d / 00:41 2715 g F Vag-Spont EPI  LIV  4 SAB 07/2013          3 Term 02/15/11 [redacted]w[redacted]d  2892 g M Vag-Spont EPI  LIV  2 Preterm 02/26/10 [redacted]w[redacted]d  1673 g F Vag-Spont EPI  LIV     Birth Comments: d/t pre-e  1 Term 02/01/08 [redacted]w[redacted]d  2665 g F Vag-Spont EPI  LIV    Obstetric Comments  Pre E with 2nd and 4th preg   Past Surgical History:  Procedure Laterality Date   TONSILLECTOMY     WISDOM TOOTH EXTRACTION Right 2022   Family History  Problem Relation Age of Onset   Fibromyalgia Mother    Sarcoidosis Mother    Hypertension Father    Diabetes Father    Diabetes Paternal Grandmother    Social History   Tobacco Use   Smoking status: Former    Types: Cigarettes, Cigars   Smokeless tobacco: Never   Tobacco comments:    Smoked for about  a year  [employer]   Vaping status: Never Used  Substance Use Topics   Alcohol use: No   Drug use: No   No Known Allergies Medications Prior to Admission  Medication Sig Dispense Refill Last Dose/Taking   acyclovir  (ZOVIRAX ) 400 MG tablet Take 1 tablet (400 mg total) by mouth 3 (three) times daily. 90 tablet 3 03/20/2024   aspirin  EC 81 MG tablet Take 2 tablets (162 mg total) by mouth daily. Swallow whole. 180 tablet 2 03/20/2024   ferrous sulfate  325 (65 FE) MG tablet Take 1 tablet (325 mg total) by mouth every other day. 45 tablet 2 03/20/2024   Prenatal Vit-Fe Fumarate-FA (PRENATAL VITAMINS PLUS PO)    03/20/2024   Blood Pressure Monitor MISC For regular home bp monitoring during pregnancy 1 each 0    cefadroxil  (DURICEF) 500 MG capsule Take 1 capsule (500 mg total) by mouth 2 (two) times daily. (Patient not taking: Reported on 03/20/2024) 14 capsule 0    fluconazole  (DIFLUCAN ) 150 MG tablet Take 150 mg by mouth once. (Patient not taking: Reported on 03/20/2024)  UNABLE TO FIND Iron blood builder-daily (Patient not taking: Reported on 03/20/2024)      UNABLE TO FIND Emergency Vit C-packet once or twice a week (Patient not taking: Reported on 03/20/2024)       I have reviewed patient's Past Medical Hx, Surgical Hx, Family Hx, Social Hx, medications and allergies.   ROS  Pertinent items noted in HPI and remainder of comprehensive ROS otherwise negative.   PHYSICAL EXAM  Patient Vitals for the past 24 hrs:  BP Temp Temp src Pulse Resp Height Weight  03/21/24 0701 126/72 -- -- 88 -- -- --  03/21/24 0656 123/79 -- -- (!) 110 -- -- --  03/21/24 0631 (!) 149/86 98.5 F (36.9 C) Oral 96 12 5' 4.5 (1.638 m) 120.1 kg    Constitutional: Well-developed, well-nourished female in no acute distress.  Cardiovascular: normal rate & rhythm, warm and well-perfused Respiratory: normal effort, no problems with respiration noted GI: Abd soft, non-tender, non-distended MS: Extremities  nontender, no edema, normal ROM Neurologic: Alert and oriented x 4.  Pelvic: Deferred     Fetal Tracing: Baseline: 120 bpm Variability: Moderate Accelerations: Present Decelerations: Absent Toco: ctx q56min   Labs: Results for orders placed or performed during the hospital encounter of 03/21/24 (from the past 24 hours)  CBC     Status: Abnormal   Collection Time: 03/21/24  6:49 AM  Result Value Ref Range   WBC 9.9 4.0 - 10.5 K/uL   RBC 3.34 (L) 3.87 - 5.11 MIL/uL   Hemoglobin 10.5 (L) 12.0 - 15.0 g/dL   HCT 69.8 (L) 63.9 - 53.9 %   MCV 90.1 80.0 - 100.0 fL   MCH 31.4 26.0 - 34.0 pg   MCHC 34.9 30.0 - 36.0 g/dL   RDW 85.9 88.4 - 84.4 %   Platelets 141 (L) 150 - 400 K/uL   nRBC 0.0 0.0 - 0.2 %    Imaging:  No results found.  MDM & MAU COURSE  MDM: Moderate  MAU Course: Orders Placed This Encounter  Procedures   CBC   Comprehensive metabolic panel with GFR   Protein / creatinine ratio, urine   Meds ordered this encounter  Medications   acetaminophen  (TYLENOL ) tablet 650 mg   Elevated blood pressure on arrival, describing floaters and 5/10 headache. Given history of pre-eclampsia and elevated blood pressures in the office and at home, will admit for IOL for gestational hypertension. PEC labs collected on arrival. ASSESSMENT   1. Supervision of high risk pregnancy, antepartum     PLAN  Admit for IOL for gestational hypertension     Allergies as of 03/21/2024   No Known Allergies       Charlie Courts, MD  Family Medicine - Obstetrics Fellow

## 2024-03-21 NOTE — Plan of Care (Signed)
  Problem: Education: Goal: Knowledge of Childbirth will improve Outcome: Progressing Goal: Ability to make informed decisions regarding treatment and plan of care will improve Outcome: Progressing Goal: Ability to state and carry out methods to decrease the pain will improve Outcome: Progressing Goal: Individualized Educational Video(s) Outcome: Progressing   Problem: Coping: Goal: Ability to verbalize concerns and feelings about labor and delivery will improve Outcome: Progressing   Problem: Life Cycle: Goal: Ability to make normal progression through stages of labor will improve Outcome: Progressing Goal: Ability to effectively push during vaginal delivery will improve Outcome: Progressing   Problem: Role Relationship: Goal: Will demonstrate positive interactions with the child Outcome: Progressing   Problem: Safety: Goal: Risk of complications during labor and delivery will decrease Outcome: Progressing   Problem: Pain Management: Goal: Relief or control of pain from uterine contractions will improve Outcome: Progressing   Problem: Education: Goal: Knowledge of General Education information will improve Description: Including pain rating scale, medication(s)/side effects and non-pharmacologic comfort measures Outcome: Progressing   Problem: Health Behavior/Discharge Planning: Goal: Ability to manage health-related needs will improve Outcome: Progressing   Problem: Clinical Measurements: Goal: Ability to maintain clinical measurements within normal limits will improve Outcome: Progressing Goal: Will remain free from infection Outcome: Progressing Goal: Diagnostic test results will improve Outcome: Progressing Goal: Respiratory complications will improve Outcome: Progressing Goal: Cardiovascular complication will be avoided Outcome: Progressing   Problem: Activity: Goal: Risk for activity intolerance will decrease Outcome: Progressing   Problem:  Nutrition: Goal: Adequate nutrition will be maintained Outcome: Progressing   Problem: Coping: Goal: Level of anxiety will decrease Outcome: Progressing   Problem: Elimination: Goal: Will not experience complications related to bowel motility Outcome: Progressing Goal: Will not experience complications related to urinary retention Outcome: Progressing   Problem: Pain Managment: Goal: General experience of comfort will improve and/or be controlled Outcome: Progressing   Problem: Safety: Goal: Ability to remain free from injury will improve Outcome: Progressing   Problem: Skin Integrity: Goal: Risk for impaired skin integrity will decrease Outcome: Progressing

## 2024-03-21 NOTE — H&P (Addendum)
 OBSTETRIC ADMISSION HISTORY AND PHYSICAL  Dominique Garza is a 38 y.o. female [phone] with IUP at [redacted]w[redacted]d by 8wk US  presenting for IOL in the setting of gHTN vs PreE. She reports +FMs, No LOF, no bleeding. Reports ctx 5-6x/hour mild strength lasting 30-60 seconds. She does have a headache and floaters in her vision with mild improvement from baseline today. She plans on breast feeding. She request vasectomy for birth control.  She received her prenatal care at [facility]   Dating: By 8 wk US  --->  Estimated Date of Delivery: 04/07/24  Sono:    @[redacted]w[redacted]d , CWD, normal anatomy, cephalic presentation, anterior placenta, 2425g, 44% EFW   Prenatal History/Complications: HSV, gHTN  Past Medical History: Past Medical History:  Diagnosis Date   Acne 12/27/2014   Anemia    Anxiety    Fibromyalgia    Hives of unknown origin 12/27/2014   HSV-2 seropositive    Ovarian cyst    Pregnancy induced hypertension    UTI (urinary tract infection)     Past Surgical History: Past Surgical History:  Procedure Laterality Date   TONSILLECTOMY     WISDOM TOOTH EXTRACTION Right 2022    Obstetrical History: OB History     Gravida  6   Para  4   Term  3   Preterm  1   AB  1   Living  4      SAB  1   IAB      Ectopic      Multiple  0   Live Births  4        Obstetric Comments  Pre E with 2nd and 4th preg         Social History Social History   Socioeconomic History   Marital status: Married    Spouse name: Not on file   Number of children: Not on file   Years of education: Not on file   Highest education level: 12th grade  Occupational History   Not on file  Tobacco Use   Smoking status: Former    Types: Cigarettes, Cigars   Smokeless tobacco: Never   Tobacco comments:    Smoked for about a year  Advertising Account Planner   Vaping status: Never Used  Substance and Sexual Activity   Alcohol use: No   Drug use: No   Sexual activity: Yes    Birth control/protection: None   Other Topics Concern   Not on file  Social History Narrative   Not on file   Social Drivers of Health   Financial Resource Strain: Low Risk  (02/19/2023)   Overall Financial Resource Strain (CARDIA)    Difficulty of Paying Living Expenses: Not very hard  Food Insecurity: No Food Insecurity (03/21/2024)   Hunger Vital Sign    Worried About Running Out of Food in the Last Year: Never true    Ran Out of Food in the Last Year: Never true  Transportation Needs: No Transportation Needs (03/21/2024)   PRAPARE - Administrator, Civil Service (Medical): No    Lack of Transportation (Non-Medical): No  Physical Activity: Sufficiently Active (02/19/2023)   Exercise Vital Sign    Days of Exercise per Week: 7 days    Minutes of Exercise per Session: 40 min  Stress: No Stress Concern Present (02/19/2023)   [employer] of Occupational Health - Occupational Stress Questionnaire    Feeling of Stress : Not at all  Social Connections: Moderately Integrated (02/19/2023)  Social Advertising Account Executive    Frequency of Communication with Friends and Family: More than three times a week    Frequency of Social Gatherings with Friends and Family: More than three times a week    Attends Religious Services: More than 4 times per year    Active Member of [employer] or Organizations: No    Attends Engineer, Structural: Not on file    Marital Status: Married    Family History: Family History  Problem Relation Age of Onset   Fibromyalgia Mother    Sarcoidosis Mother    Hypertension Father    Diabetes Father    Diabetes Paternal Grandmother     Allergies: No Known Allergies  Medications Prior to Admission  Medication Sig Dispense Refill Last Dose/Taking   acyclovir  (ZOVIRAX ) 400 MG tablet Take 1 tablet (400 mg total) by mouth 3 (three) times daily. 90 tablet 3 03/20/2024   aspirin  EC 81 MG tablet Take 2 tablets (162 mg total) by mouth daily. Swallow whole. 180 tablet  2 03/20/2024   ferrous sulfate  325 (65 FE) MG tablet Take 1 tablet (325 mg total) by mouth every other day. 45 tablet 2 03/20/2024   Prenatal Vit-Fe Fumarate-FA (PRENATAL VITAMINS PLUS PO)    03/20/2024   Blood Pressure Monitor MISC For regular home bp monitoring during pregnancy 1 each 0    cefadroxil  (DURICEF) 500 MG capsule Take 1 capsule (500 mg total) by mouth 2 (two) times daily. (Patient not taking: Reported on 03/20/2024) 14 capsule 0    fluconazole  (DIFLUCAN ) 150 MG tablet Take 150 mg by mouth once. (Patient not taking: Reported on 03/20/2024)      UNABLE TO FIND Iron blood builder-daily (Patient not taking: Reported on 03/20/2024)      UNABLE TO FIND Emergency Vit C-packet once or twice a week (Patient not taking: Reported on 03/20/2024)        Review of Systems   All systems reviewed and negative except as stated in HPI  Blood pressure 137/78, pulse 84, temperature 98.4 F (36.9 C), temperature source Oral, resp. rate 18, height 5' 4.5 (1.638 m), weight 120.1 kg. General appearance: alert, cooperative, and morbidly obese Lungs: clear to auscultation bilaterally Heart: regular rate and rhythm Abdomen: soft, non-tender; bowel sounds normal Pelvic: cervical exam below Extremities: no LE edema Presentation: cephalic on US  Fetal monitoringBaseline: 130 bpm, Variability: Good {> 6 bpm), Accelerations: Reactive, and Decelerations: Absent Uterine activityFrequency: Every 2-3 minutes, Duration: 60-80 seconds, and Intensity: mild  Dilation: Fingertip Effacement (%): Thick Station: Ballotable Exam by:: Oleh Loges RN   Prenatal labs: ABO, Rh: --/--/A POS (11/11 9241) Antibody: NEG (11/11 0758) Rubella: 4.18 (05/30 0930) RPR: NON REACTIVE (11/11 0758)  HBsAg: Negative (05/30 0930)  HIV: Non Reactive (08/25 0910)  GBS: Positive/-- (10/27 1636)    Lab Results  Component Value Date   GBS Positive (A) 03/06/2024   GTT normal Genetic screening  normal, NIPS LR female Anatomy  US  normal  Immunization History  Administered Date(s) Administered   Tdap 09/02/2014, 01/24/2024    Prenatal Transfer Tool  Maternal Diabetes: No Genetic Screening: Normal Maternal Ultrasounds/Referrals: Normal Fetal Ultrasounds or other Referrals:  None Maternal Substance Abuse:  No Significant Maternal Medications:  None Significant Maternal Lab Results: Group B Strep positive Number of Prenatal Visits:greater than 3 verified prenatal visits Maternal Vaccinations:TDap Other Comments:  None        NURSING  PROVIDER  Office Location [facility] Dating by U/S at 8 wks  [facility] Model Traditional Anatomy U/S Normal female 'Ja'Kobe'  Initiated care at  13wks                 Language  English               LAB RESULTS   Support Person   Genetics NIPS: LR female AFP:       NT/IT (FT only) neg      Carrier Screen CF neg prev preg  Rhogam  A/Positive/-- (05/30 0930)N/a A1C/GTT Early HgbA1C: 5.7>wnl Third trimester 2 hr GTT: normal  Flu Vaccine        TDaP Vaccine 01/24/24  Blood Type A/Positive/-- (05/30 0930)  RSV Vaccine   Antibody Negative (05/30 0930)  COVID Vaccine   Rubella 4.18 (05/30 0930)  Feeding Plan breast RPR Non Reactive (05/30 0930)  Contraception  maybe Mirena (PCOS) v vasectomy  HBsAg Negative (05/30 0930)  Circumcision yes HIV Non Reactive (05/30 0930)  Pediatrician  [facility] HCVAb Non Reactive (05/30 0930)  Prenatal Classes discussed      BTL Consent   Pap 03/06/24: neg w/ -HRHPV  BTL Pre-payment   GC/CT Initial:  -/- 36wks:  -/-  VBAC Consent N/a GBS  POS For PCN allergy, check sensitivities   BRx Optimized? [ ]  yes   [ ]  no      DME Rx [ x] BP cuff [ ]  Weight Scale Waterbirth  [ ]  Class [ ]  Consent [ ]  CNM visit  PHQ9 & GAD7 [ x ] new OB [  ] 28 weeks  [  ] 36 weeks Induction  [ ]  Orders Entered [ ] Foley Y/N       Results for orders placed or performed during the hospital encounter of 03/21/24 (from the past 24 hours)  CBC   Collection Time:  03/21/24  6:49 AM  Result Value Ref Range   WBC 9.9 4.0 - 10.5 K/uL   RBC 3.34 (L) 3.87 - 5.11 MIL/uL   Hemoglobin 10.5 (L) 12.0 - 15.0 g/dL   HCT 69.8 (L) 63.9 - 53.9 %   MCV 90.1 80.0 - 100.0 fL   MCH 31.4 26.0 - 34.0 pg   MCHC 34.9 30.0 - 36.0 g/dL   RDW 85.9 88.4 - 84.4 %   Platelets 141 (L) 150 - 400 K/uL   nRBC 0.0 0.0 - 0.2 %  Comprehensive metabolic panel with GFR   Collection Time: 03/21/24  6:49 AM  Result Value Ref Range   Sodium 134 (L) 135 - 145 mmol/L   Potassium 3.7 3.5 - 5.1 mmol/L   Chloride 102 98 - 111 mmol/L   CO2 22 22 - 32 mmol/L   Glucose, Bld 108 (H) 70 - 99 mg/dL   BUN 7 6 - 20 mg/dL   Creatinine, Ser 9.14 0.44 - 1.00 mg/dL   Calcium 9.8 8.9 - 89.6 mg/dL   Total Protein 6.6 6.5 - 8.1 g/dL   Albumin 3.0 (L) 3.5 - 5.0 g/dL   AST 30 15 - 41 U/L   ALT 47 (H) 0 - 44 U/L   Alkaline Phosphatase 102 38 - 126 U/L   Total Bilirubin 0.3 0.0 - 1.2 mg/dL   GFR, Estimated >39 >39 mL/min   Anion gap 10 5 - 15  Protein / creatinine ratio, urine   Collection Time: 03/21/24  7:00 AM  Result Value Ref Range   Creatinine, Urine 56 mg/dL   Total Protein, Urine 7 mg/dL   Protein Creatinine Ratio 0.13  0.00 - 0.15 mg/mg[Cre]  RPR   Collection Time: 03/21/24  7:58 AM  Result Value Ref Range   RPR Ser Ql NON REACTIVE NON REACTIVE  Type and screen [facility]   Collection Time: 03/21/24  7:58 AM  Result Value Ref Range   ABO/RH(D) A POS    Antibody Screen NEG    Sample Expiration      03/24/2024,2359 Performed at [facility], [address], [place], [place] [address]     Patient Active Problem List   Diagnosis Date Noted   Gestational hypertension 03/21/2024   Anemia affecting pregnancy in third trimester 01/06/2024   BMI 40.0-44.9, adult (HCC) 11/23/2023   Asymptomatic bacteriuria during pregnancy 10/11/2023   History of pre-eclampsia in prior pregnancy, currently pregnant 10/05/2023   Supervision of high risk pregnancy, antepartum  10/05/2023   History of prior pregnancy with IUGR newborn 10/05/2023   AMA (advanced maternal age) multigravida 35+ 10/05/2023   Vitamin D  deficiency 08/13/2023   IFG (impaired fasting glucose) 08/13/2023   Oligomenorrhea 11/25/2017    Assessment/Plan:  Dominique Garza is a 38 y.o. H3E6885 at [redacted]w[redacted]d here for IOL in the setting of gHTN.   #Labor: Start vaginal cytotec, will reevaluate in 4 hours #Pain: Mild pain now, plans for epidural once more severe #FWB: Category 1 #GBS status: Positive, PCN started #gHTN: PreE labs not meeting PreE criteria, monitor BP and labs #HSV, on suppression  #Feeding: Breastmilk  #Reproductive Life planning: plan for Vasectomy of FOB #Circ:  yes  Fairy Amy, MD  03/21/2024, 10:41 AM  GME ATTESTATION:  Evaluation and management procedures were performed by the [facility] Medicine Resident under my supervision. I was immediately available for direct supervision, assistance and direction throughout this encounter.  I also confirm that I have verified the information documented in the resident's note, and that I have also personally reperformed the pertinent components of the physical exam and all of the medical decision making activities.  I have also made any necessary editorial changes.  Leeroy KATHEE Pouch, MD OB Fellow, Faculty Practice [facility], Center for [facility] 03/21/2024 8:15 PM

## 2024-03-22 ENCOUNTER — Encounter (HOSPITAL_COMMUNITY): Payer: Self-pay | Admitting: Obstetrics & Gynecology

## 2024-03-22 LAB — CBC
HCT: 31.2 % — ABNORMAL LOW (ref 36.0–46.0)
Hemoglobin: 10.7 g/dL — ABNORMAL LOW (ref 12.0–15.0)
MCH: 31.2 pg (ref 26.0–34.0)
MCHC: 34.3 g/dL (ref 30.0–36.0)
MCV: 91 fL (ref 80.0–100.0)
Platelets: 131 K/uL — ABNORMAL LOW (ref 150–400)
RBC: 3.43 MIL/uL — ABNORMAL LOW (ref 3.87–5.11)
RDW: 14 % (ref 11.5–15.5)
WBC: 13.1 K/uL — ABNORMAL HIGH (ref 4.0–10.5)
nRBC: 0 % (ref 0.0–0.2)

## 2024-03-22 MED ORDER — DIBUCAINE (PERIANAL) 1 % EX OINT: 1.0000 | TOPICAL_OINTMENT | CUTANEOUS | Status: DC | PRN

## 2024-03-22 MED ORDER — TETANUS-DIPHTH-ACELL PERTUSSIS 5-2-15.5 LF-MCG/0.5 IM SUSP: 0.5000 mL | Freq: Once | INTRAMUSCULAR | Status: DC

## 2024-03-22 MED ORDER — BENZOCAINE-MENTHOL 20-0.5 % EX AERO
1.0000 | INHALATION_SPRAY | CUTANEOUS | Status: DC | PRN
Start: 2024-03-22 — End: 2024-03-23
  Administered 2024-03-22: 1 via TOPICAL
  Filled 2024-03-22: qty 56

## 2024-03-22 MED ORDER — LACTATED RINGERS IV SOLN: INTRAVENOUS | Status: DC

## 2024-03-22 MED ORDER — LACTATED RINGERS IV SOLN: 500.0000 mL | INTRAVENOUS | Status: DC | PRN

## 2024-03-22 MED ORDER — COCONUT OIL OIL: 1.0000 | TOPICAL_OIL | Status: DC | PRN

## 2024-03-22 MED ORDER — IBUPROFEN 600 MG PO TABS
600.0000 mg | ORAL_TABLET | Freq: Four times a day (QID) | ORAL | Status: DC
  Administered 2024-03-22 – 2024-03-23 (×4): 600 mg via ORAL
  Filled 2024-03-22 (×4): qty 1

## 2024-03-22 MED ORDER — POTASSIUM CHLORIDE CRYS ER 20 MEQ PO TBCR
20.0000 meq | EXTENDED_RELEASE_TABLET | Freq: Every day | ORAL | Status: DC
  Administered 2024-03-23: 20 meq via ORAL
  Filled 2024-03-22: qty 1

## 2024-03-22 MED ORDER — ACETAMINOPHEN 325 MG PO TABS: 650.0000 mg | ORAL_TABLET | ORAL | Status: DC | PRN

## 2024-03-22 MED ORDER — FUROSEMIDE 20 MG PO TABS
20.0000 mg | ORAL_TABLET | Freq: Every day | ORAL | Status: DC
  Administered 2024-03-23: 20 mg via ORAL
  Filled 2024-03-22: qty 1

## 2024-03-22 MED ORDER — SIMETHICONE 80 MG PO CHEW: 80.0000 mg | CHEWABLE_TABLET | ORAL | Status: DC | PRN

## 2024-03-22 MED ORDER — ONDANSETRON HCL 4 MG PO TABS: 4.0000 mg | ORAL_TABLET | ORAL | Status: DC | PRN

## 2024-03-22 MED ORDER — DIPHENHYDRAMINE HCL 25 MG PO CAPS: 25.0000 mg | ORAL_CAPSULE | Freq: Four times a day (QID) | ORAL | Status: DC | PRN

## 2024-03-22 MED ORDER — WITCH HAZEL-GLYCERIN EX PADS: 1.0000 | MEDICATED_PAD | CUTANEOUS | Status: DC | PRN

## 2024-03-22 MED ORDER — ONDANSETRON HCL 4 MG/2ML IJ SOLN: 4.0000 mg | INTRAMUSCULAR | Status: DC | PRN

## 2024-03-22 MED ORDER — PRENATAL MULTIVITAMIN CH
1.0000 | ORAL_TABLET | Freq: Every day | ORAL | Status: DC
  Administered 2024-03-22 – 2024-03-23 (×2): 1 via ORAL
  Filled 2024-03-22 (×2): qty 1

## 2024-03-22 MED ORDER — ZOLPIDEM TARTRATE 5 MG PO TABS: 5.0000 mg | ORAL_TABLET | Freq: Every evening | ORAL | Status: DC | PRN

## 2024-03-22 MED ORDER — SENNOSIDES-DOCUSATE SODIUM 8.6-50 MG PO TABS
2.0000 | ORAL_TABLET | Freq: Every day | ORAL | Status: DC
  Administered 2024-03-23: 2 via ORAL
  Filled 2024-03-22: qty 2

## 2024-03-22 NOTE — Anesthesia Postprocedure Evaluation (Signed)
 Anesthesia Post Note  Patient: Dominique Garza  Procedure(s) Performed: AN AD HOC LABOR EPIDURAL     Patient location during evaluation: Mother Baby Anesthesia Type: Epidural Level of consciousness: awake and alert Pain management: pain level controlled Vital Signs Assessment: post-procedure vital signs reviewed and stable Respiratory status: spontaneous breathing, nonlabored ventilation and respiratory function stable Cardiovascular status: stable Postop Assessment: no headache, no backache and epidural receding Anesthetic complications: no   No notable events documented.  Last Vitals:  Vitals:   03/22/24 1215 03/22/24 1225  BP: 119/74 117/71  Pulse: 80 85  Resp:    Temp:    SpO2:      Last Pain:  Vitals:   03/22/24 1225  TempSrc:   PainSc: 0-No pain                 Taelyn Nemes

## 2024-03-22 NOTE — Lactation Note (Signed)
 This note was copied from a baby's chart. Lactation Consultation Note  Patient Name: Dominique Garza Unijb'd Date: 03/22/2024 Age:37 hours Reason for consult: Initial assessment;Early term 37-38.6wks (hx HSV)  P5- MOB reports that infant has latched well so far, but he is a little sleepy. LC reviewed the first 24 hr birthday nap and how this is normal behavior. MOB reports that infant had recently fed. Infant was being held by his older sisters at this time. LC encouraged MOB to call for a latch assessment. MOB denies having any questions or concerns. [facility] referral sent today.  LC reviewed the first 24 hr birthday nap, day 2 cluster feeding, feeding infant on cue 8-12x in 24 hrs, not allowing infant to go over 3 hrs without a feeding, CDC milk storage guidelines, LC services handout. LC encouraged MOB to call for further assistance as needed.  Maternal Data Has patient been taught Hand Expression?: No Does the patient have breastfeeding experience prior to this delivery?: Yes How long did the patient breastfeed?: 3 years for first child, 6 months for second child (NICU), 2 years for third child and 1 year for fourth child  Feeding Mother's Current Feeding Choice: Breast Milk  Lactation Tools Discussed/Used Pump Education: Milk Storage  Interventions Interventions: Breast feeding basics reviewed;Education;LC Services brochure  Discharge Discharge Education: Engorgement and breast care;Warning signs for feeding baby Pump: Referral sent for [facility] Pump  Consult Status Consult Status: Follow-up Date: 03/23/24 Follow-up type: In-patient    Recardo Hoit BS, IBCLC 03/22/2024, 5:45 PM

## 2024-03-22 NOTE — Progress Notes (Signed)
 Dominique Garza is a 38 y.o. H3E6885 at [redacted]w[redacted]d by ultrasound admitted for induction of labor due to [facility].  Subjective: Dominique Garza is feeling well overall, she is comfortable with her epidural.   Objective: BP 135/74   Pulse 67   Temp 98 F (36.7 C) (Oral)   Resp 16   Ht 5' 4.5 (1.638 m)   Wt 120.1 kg   SpO2 100%   BMI 44.73 kg/m  No intake/output data recorded. No intake/output data recorded.  FHT:  FHR: 125 bpm, variability: moderate,  accelerations:  Present,  decelerations:  Absent UC:   regular, every 2-3 minutes SVE:   Dilation: 5 Effacement (%): 60 Station: -3 Exam by:: A Acupuncturist -Student Midwife  Labs: Lab Results  Component Value Date   WBC 9.7 03/21/2024   HGB 10.4 (L) 03/21/2024   HCT 30.2 (L) 03/21/2024   MCV 90.4 03/21/2024   PLT 139 (L) 03/21/2024    Assessment / Plan: Induction of labor due to gestational hypertension,  progressing well on pitocin   Labor: Progressing well on pitocin , AROM @ 0425 GHTN: Normotensive, continue to monitor Fetal Wellbeing:  Category I Pain Control:  Epidural I/D:  GBS neg, ROM @ 0425 clear Anticipated MOD:  NSVD  Rosina Hamilton, Student-MidWife 03/22/2024, 4:31 AM

## 2024-03-22 NOTE — Discharge Summary (Signed)
 Postpartum Discharge Summary    Patient Name: Dominique Garza DOB: 06/01/1985 MRN: [id number]  Date of admission: 03/21/2024 Delivery date:03/22/2024 Delivering provider: JOMARIE CAMPI A Date of discharge: 03/23/2024  Admitting diagnosis: Encounter for induction of labor [Z34.90] Intrauterine pregnancy: [redacted]w[redacted]d     Secondary diagnosis:  Active Problems:   History of pre-eclampsia in prior pregnancy, currently pregnant   AMA (advanced maternal age) multigravida 35+   BMI 40.0-44.9, adult (HCC)   Anemia affecting pregnancy in third trimester   Gestational hypertension  Additional problems:     Discharge diagnosis: Term Pregnancy Delivered and Gestational Hypertension                                              Post partum procedures:NONE Augmentation: AROM, Pitocin , Cytotec, and IP Foley Complications: None  Hospital course: Induction of Labor With Vaginal Delivery   38 y.o. yo H3E5884 at [redacted]w[redacted]d was admitted to the hospital 03/21/2024 for induction of labor.  Indication for induction: Gestational hypertension.  Patient had an labor course that was uncomplicated Membrane Rupture Time/Date: 4:25 AM,03/22/2024  Delivery Method:Vaginal, Spontaneous Operative Delivery:N/A Episiotomy: None Lacerations:  1st degree;Periurethral Details of delivery can be found in separate delivery note.  Patient had a postpartum course complicated by none. Patient is discharged home 03/23/24.  Newborn Data: Birth date:03/22/2024 Birth time:10:47 AM Gender:Female Living status:Living Apgars:8 ,9  Weight:3240 g  Magnesium Sulfate received: No BMZ received: No Rhophylac:N/A MMR:N/A T-DaP:Given prenatally Flu: No RSV Vaccine received: No Transfusion:No  Immunizations received: Immunization History  Administered Date(s) Administered   Tdap 09/02/2014, 01/24/2024    Physical exam  Vitals:   03/22/24 1430 03/22/24 1823 03/22/24 2210 03/23/24 0546  BP: 119/76 134/81 125/64 128/71   Pulse: 85 92 69 75  Resp: 16 17 16 16   Temp: 98.1 F (36.7 C) 98 F (36.7 C) 98.1 F (36.7 C) 97.8 F (36.6 C)  TempSrc: Oral Oral Oral Oral  SpO2:   100% 100%  Weight:      Height:       General: alert, cooperative, and no distress Lochia: appropriate Uterine Fundus: firm Incision: N/A DVT Evaluation: No significant calf/ankle edema. Labs: Lab Results  Component Value Date   WBC 10.8 (H) 03/23/2024   HGB 9.2 (L) 03/23/2024   HCT 26.4 (L) 03/23/2024   MCV 90.1 03/23/2024   PLT 124 (L) 03/23/2024      Latest Ref Rng & Units 03/21/2024    5:44 PM  CMP  Glucose 70 - 99 mg/dL 98   BUN 6 - 20 mg/dL 6   Creatinine 9.55 - 8.99 mg/dL 9.21   Sodium 864 - 854 mmol/L 133   Potassium 3.5 - 5.1 mmol/L 3.7   Chloride 98 - 111 mmol/L 102   CO2 22 - 32 mmol/L 21   Calcium 8.9 - 10.3 mg/dL 9.1   Total Protein 6.5 - 8.1 g/dL 6.7   Total Bilirubin 0.0 - 1.2 mg/dL <9.7   Alkaline Phos 38 - 126 U/L 101   AST 15 - 41 U/L 31   ALT 0 - 44 U/L 44    Edinburgh Score:    03/22/2024    1:02 PM  Edinburgh Postnatal Depression Scale Screening Tool  I have been able to laugh and see the funny side of things. 0  I have looked forward with enjoyment to things. 0  I have blamed myself unnecessarily when things went wrong. 0  I have been anxious or worried for no good reason. 1  I have felt scared or panicky for no good reason. 0  Things have been getting on top of me. 0  I have been so unhappy that I have had difficulty sleeping. 0  I have felt sad or miserable. 0  I have been so unhappy that I have been crying. 0  The thought of harming myself has occurred to me. 0  Edinburgh Postnatal Depression Scale Total 1   Edinburgh Postnatal Depression Scale Total: 1   After visit meds:  Allergies as of 03/23/2024   No Known Allergies      Medication List     STOP taking these medications    acyclovir  400 MG tablet Commonly known as: ZOVIRAX    aspirin  EC 81 MG tablet    cefadroxil  500 MG capsule Commonly known as: DURICEF   fluconazole  150 MG tablet Commonly known as: DIFLUCAN        TAKE these medications    acetaminophen  500 MG tablet Commonly known as: TYLENOL  Take 2 tablets (1,000 mg total) by mouth every 6 (six) hours as needed for moderate pain (pain score 4-6).   Blood Pressure Monitor Misc For regular home bp monitoring during pregnancy   ferrous sulfate  325 (65 FE) MG tablet Take 1 tablet (325 mg total) by mouth every other day.   furosemide 20 MG tablet Commonly known as: LASIX Take 1 tablet (20 mg total) by mouth daily. Start taking on: March 24, 2024   ibuprofen  600 MG tablet Commonly known as: ADVIL  Take 1 tablet (600 mg total) by mouth every 6 (six) hours as needed for moderate pain (pain score 4-6).   influenza vac split trivalent PF 0.5 ML injection Commonly known as: FLUZONE Inject 0.5 mLs into the muscle once for 1 dose. Offer postpartum if not received during prenatal care   potassium chloride SA 20 MEQ tablet Commonly known as: KLOR-CON M Take 1 tablet (20 mEq total) by mouth daily. Start taking on: March 24, 2024   PRENATAL VITAMINS PLUS PO   senna-docusate 8.6-50 MG tablet Commonly known as: Senokot-S Take 2 tablets by mouth at bedtime as needed for mild constipation or moderate constipation.   UNABLE TO FIND Iron blood builder-daily   UNABLE TO FIND Emergency Vit C-packet once or twice a week         Discharge home in stable condition Infant Feeding: Breast Infant Disposition:home with mother Discharge instruction: per After Visit Summary and Postpartum booklet. Activity: Advance as tolerated. Pelvic rest for 6 weeks.  Diet: routine diet Future Appointments: Future Appointments  Date Time Provider Department Center  03/29/2024 10:10 AM [facility] NURSE [facility]  04/26/2024 10:30 AM Kizzie Suzen SAUNDERS, CNM [facility]   Follow up Visit:  Follow-up Information     [facility].   Why: Look for UROLOGY office for vasectomy Contact information: [address] [place]  [address] [phone]                 Please schedule this patient for a In person postpartum visit in 6 weeks with the following provider: Any provider. Additional Postpartum F/U:BP check 1 week  High risk pregnancy complicated by: HTN Delivery mode:  Vaginal, Spontaneous Anticipated Birth Control:  vasectomy  Message sent to [facility] 11/12  03/23/2024 Barabara Maier, DO

## 2024-03-22 NOTE — Progress Notes (Signed)
 Labor Progress Note Dominique Garza is a 38 y.o. H3E6885 at [redacted]w[redacted]d presented for IOL for gHTN  S:  Feeling pressure in the lower abdomen  O:  BP 118/68   Pulse 95   Temp 98 F (36.7 C) (Oral)   Resp 18   Ht 5' 4.5 (1.638 m)   Wt 120.1 kg   SpO2 98%   BMI 44.73 kg/m  EFM: baseline 130 bpm/ moderate variability/ + accels/ early decels  Toco/IUPC: ctx q2-26min SVE: Dilation: 8.5 Effacement (%): 80 Cervical Position: Posterior Station: -1 Presentation: Vertex Exam by:: Lum Sharps RN Pitocin : 22 mu/min  A/P: 38 y.o. H3E6885 [redacted]w[redacted]d  1. Labor: Continues on pitocin  for augmentation 2. Pain: Epidural 3. GBS positive, on PCN for ppx 4. gHTN: episodes of hypotension since receiving epidural  - Anticipate SVD.  Charlie DELENA Courts, MD 9:18 AM

## 2024-03-23 ENCOUNTER — Other Ambulatory Visit

## 2024-03-23 ENCOUNTER — Other Ambulatory Visit (HOSPITAL_COMMUNITY): Payer: Self-pay

## 2024-03-23 LAB — CBC
HCT: 26.4 % — ABNORMAL LOW (ref 36.0–46.0)
Hemoglobin: 9.2 g/dL — ABNORMAL LOW (ref 12.0–15.0)
MCH: 31.4 pg (ref 26.0–34.0)
MCHC: 34.8 g/dL (ref 30.0–36.0)
MCV: 90.1 fL (ref 80.0–100.0)
Platelets: 124 K/uL — ABNORMAL LOW (ref 150–400)
RBC: 2.93 MIL/uL — ABNORMAL LOW (ref 3.87–5.11)
RDW: 14 % (ref 11.5–15.5)
WBC: 10.8 K/uL — ABNORMAL HIGH (ref 4.0–10.5)
nRBC: 0 % (ref 0.0–0.2)

## 2024-03-23 MED ORDER — FUROSEMIDE 20 MG PO TABS
20.0000 mg | ORAL_TABLET | Freq: Every day | ORAL | 0 refills | Status: DC
  Filled 2024-03-23: qty 4, 4d supply, fill #0

## 2024-03-23 MED ORDER — ACETAMINOPHEN 500 MG PO TABS
1000.0000 mg | ORAL_TABLET | Freq: Four times a day (QID) | ORAL | 0 refills | Status: DC | PRN
  Filled 2024-03-23: qty 30, 4d supply, fill #0

## 2024-03-23 MED ORDER — IBUPROFEN 600 MG PO TABS
600.0000 mg | ORAL_TABLET | Freq: Four times a day (QID) | ORAL | 0 refills | Status: DC | PRN
  Filled 2024-03-23: qty 30, 8d supply, fill #0

## 2024-03-23 MED ORDER — INFLUENZA VIRUS VACC SPLIT PF (FLUZONE) 0.5 ML IM SUSY
0.5000 mL | PREFILLED_SYRINGE | Freq: Once | INTRAMUSCULAR | 0 refills | Status: AC
  Filled 2024-03-23: qty 0.5, 1d supply, fill #0

## 2024-03-23 MED ORDER — SENNOSIDES-DOCUSATE SODIUM 8.6-50 MG PO TABS
2.0000 | ORAL_TABLET | Freq: Every evening | ORAL | 0 refills | Status: AC | PRN
  Filled 2024-03-23: qty 30, 15d supply, fill #0

## 2024-03-23 MED ORDER — POTASSIUM CHLORIDE CRYS ER 20 MEQ PO TBCR
20.0000 meq | EXTENDED_RELEASE_TABLET | Freq: Every day | ORAL | 0 refills | Status: DC
  Filled 2024-03-23: qty 4, 4d supply, fill #0

## 2024-03-23 NOTE — Progress Notes (Signed)
 MOB was referred for history of anxiety.  * Referral screened out by Clinical Social Worker because none of the following criteria appear to apply:  ~ History of anxiety during this pregnancy, or of post-partum depression following prior delivery.  ~ Diagnosis of anxiety within last 3 years  Per OB notes, MOB described her anxiety as situational and reported no concerns during pregnancy.  Edinburgh score 1  Anxiety 2015  OR  * MOB's symptoms currently being treated with medication and/or therapy.  Please contact the Clinical Social Worker if needs arise, by [facility] request, or if MOB scores greater than 9/yes to question 10 on Edinburgh Postpartum Depression Screen.  Rosina Molt, [place] Clinical Social Worker [phone]

## 2024-03-23 NOTE — Progress Notes (Signed)
 Per chart review SDOH determined housing insecurity. CSW met MOB at bedside to complete an assessment and offer support. CSW entered the room, introduced herself and acknowledged that FOB was present. MOB gave CSW verbal permission to speak about anything while FOB was present. CSW explained her role and the reason for the visit. MOB presented bonding/holding the infant as they lay in bed. MOB was polite, easy to engage, receptive to meeting with CSW, and appeared forthcoming.  CSW asked MOB about SDOH determining insecurity with housing. MOB reported the house address on file is a safe and stable environment for the family. FOB reported possible insecurity with utilities. CSW provided MOB with a resource list from family connect that offers a wide variety of support including utilities support.  MOB and FOB had no further concerns at this time.  CSW identifies no further need for intervention and no barriers to discharge at this time.  Rosina Molt, [place] Clinical Social Worker [phone]

## 2024-03-23 NOTE — Patient Instructions (Signed)

## 2024-03-27 ENCOUNTER — Encounter: Admitting: Obstetrics & Gynecology

## 2024-03-27 ENCOUNTER — Other Ambulatory Visit

## 2024-03-28 ENCOUNTER — Telehealth: Payer: Self-pay | Admitting: *Deleted

## 2024-03-28 NOTE — Progress Notes (Unsigned)
 Complex Care Management Care Guide Note  03/28/2024 Name: Dominique Garza MRN: [id number] DOB: [birth year]  Dominique Garza is a 38 y.o. year old female who is a primary care patient of Bacchus, Meade PEDLAR, FNP and is actively engaged with the care management team. I reached out to Dominique Garza by phone today to assist with re-scheduling  with the BSW.  Follow up plan: Unsuccessful telephone outreach attempt made. A HIPAA compliant phone message was left for the patient providing contact information and requesting a return call.  Thedford Franks, CMA [place]  [facility], [facility] Guide Direct Dial: [phone]  Fax: [phone] Website: [email or web address]

## 2024-03-29 ENCOUNTER — Ambulatory Visit: Admitting: *Deleted

## 2024-03-29 VITALS — BP 137/85 | HR 72

## 2024-03-29 DIAGNOSIS — R1030 Lower abdominal pain, unspecified: Secondary | ICD-10-CM | POA: Diagnosis not present

## 2024-03-29 DIAGNOSIS — Z0130 Encounter for examination of blood pressure without abnormal findings: Secondary | ICD-10-CM

## 2024-03-29 NOTE — Progress Notes (Signed)
 Complex Care Management Care Guide Note  03/29/2024 Name: [name] MRN: [id number] DOB: Mar 01, 1986  Dominique Garza is a 38 y.o. year old female who is a primary care patient of Bacchus, Meade PEDLAR, FNP and is actively engaged with the care management team. I reached out to Dominique Garza by phone today to assist with re-scheduling  with the BSW.  Follow up plan: Telephone appointment with complex care management team member scheduled for:  03/31/2024  Thedford Franks, CMA [place]  [facility], [facility] Guide Direct Dial: [phone]  Fax: [phone] Website: [email or web address]

## 2024-03-29 NOTE — Progress Notes (Deleted)
 Complex Care Management Note  Care Guide Note 03/29/2024 Name: Dominique Garza MRN: [id number] DOB: 1985-07-30  Dominique Garza is a 38 y.o. year old female who sees Bacchus, Meade PEDLAR, FNP for primary care. I reached out to Dominique Garza by phone today to offer complex care management services.  Ms. Manganiello was given information about Complex Care Management services today including:   The Complex Care Management services include support from the care team which includes your Nurse Care Manager, Clinical Social Worker, or Pharmacist.  The Complex Care Management team is here to help remove barriers to the health concerns and goals most important to you. Complex Care Management services are voluntary, and the patient may decline or stop services at any time by request to their care team member.   Complex Care Management Consent Status: Patient agreed to services and verbal consent obtained.   Follow up plan:  Telephone appointment with complex care management team member scheduled for:  03/31/2024  Encounter Outcome:  Patient Scheduled  Thedford Franks, CMA, [place]  [facility], [facility] Guide Direct Dial: [phone]  Fax: [phone] Website: [email or web address]

## 2024-03-29 NOTE — Progress Notes (Signed)
   NURSE VISIT- BLOOD PRESSURE CHECK  SUBJECTIVE:  Dominique Garza is a 38 y.o. [phone] female here for BP check. She is postpartum, delivery date 03/22/24    HYPERTENSION ROS:  Pregnant/postpartum:  Severe headaches that don't go away with tylenol /other medicines: No  Visual changes (seeing spots/double/blurred vision) No  Severe pain under right breast breast or in center of upper chest No  Severe nausea/vomiting No  Taking medicines as instructed not applicable  Pt also reports lower abdominal pain and slow urine flow. Pt collected urine, will send to lab for culture. Pt reassured about swelling.   OBJECTIVE:  BP 137/85 (BP Location: Right Arm, Patient Position: Sitting, Cuff Size: Normal)   Pulse 72   Breastfeeding Yes   Appearance alert, well appearing, and in no distress.  ASSESSMENT: Postpartum  blood pressure check  PLAN: Discussed with Dr. Ozan   Recommendations: no changes needed   Follow-up: as scheduled   Alan LITTIE Fischer  03/29/2024 10:51 AM

## 2024-03-31 ENCOUNTER — Other Ambulatory Visit: Payer: Self-pay

## 2024-03-31 NOTE — Patient Outreach (Signed)
 Social Drivers of Health  Community Resource and Care Coordination Visit Note   03/31/2024  Name: Dominique Garza MRN: [id number] DOB:04/27/1986  Situation: Referral received for [facility] needs assessment and assistance related to Housing  Financial Strain  Employment. I obtained verbal consent from Patient.  Visit completed with Patient on the phone.   Background:   SDOH Interventions Today    Flowsheet Row Most Recent Value  SDOH Interventions   Food Insecurity Interventions Intervention Not Indicated  [Patient approved for [facility] and Foodstamps]  Housing Interventions Community Resources Provided  [Patient has no income and owes $4000 rent.  Referred to [employer], DSS, [employer]. Plans to apply for Section 8. Educated on waiting list for housing.]  Utilities Interventions Other (Comment)  [Power bill over $700 and a payment plan effective Dec $260 but patient has no money. Plans to go to [employer]]  Financial Strain Interventions Other (Comment)  [Patient out of work due to pregnancy. Referred to [facility] for remote employment. Ex husband fiing workmans comp and child support stopped.  Husband moved out and is helping with the newborn. Daycare is a concern.  No family to assist.]     Assessment:   Goals Addressed             This Visit's Progress    BSW Goals       Current SDOH Barriers:  Financial constraints related to loss of job due to pregnancy Housing barriers Employment  Interventions: Patient interviewed and appropriate screenings performed Referred patient to community resources  Provided patient with information about Section 8 waiting list, [employer] financial, Holiday Representative, DSS, Work Market Researcher, [place] WORKS employment services. Advised patient to apply with community resources for assistance Patient owes $4000 and rent is $1000.  Patient has not paid rent since August. Patient was employed with [employer] but can't return  because she does not have child care.  Patients plans to ask the landlord if the rent can be paid when she files taxes next year.          Recommendation:   Patient to follow up with landlord, community resources and look for employment  Follow Up Plan:   Telephone follow up appointment date/time:  04/13/24  Tillman Gardener, BSW [place]  [facility], [facility] Social Worker Direct Dial : [phone]  Fax: [phone] Website: [email or web address]

## 2024-03-31 NOTE — Patient Instructions (Signed)
 Visit Information  Thank you for taking time to visit with me today. Please don't hesitate to contact me if I can be of assistance to you before our next scheduled appointment.  Our next appointment is by telephone on 04/13/24 at 10am Please call the care guide team at [phone] if you need to cancel or reschedule your appointment.   Following is a copy of your care plan:   Goals Addressed             This Visit's Progress    BSW Goals       Current SDOH Barriers:  Financial constraints related to loss of job due to pregnancy Housing barriers Employment  Interventions: Patient interviewed and appropriate screenings performed Referred patient to community resources  Provided patient with information about Section 8 waiting list, [employer] financial, Holiday Representative, DSS, Work Market Researcher, [place] WOR[employer] employment services. Advised patient to apply with community resources for assistance Patient owes $4000 and rent is $1000.  Patient has not paid rent since August. Patient was employed with [employer] but can't return because she does not have child care.  Patients plans to ask the landlord if the rent can be paid when she files taxes next year.          Please call 911 if you are experiencing a Mental Health or Behavioral Health Crisis or need someone to talk to.  Patient verbalized understanding of Care plan and visit instructions communicated this visit  Tillman Gardener, BSW [place]  [facility], [facility] Social Worker Direct Dial : [phone]  Fax: [phone] Website: [email or web address]

## 2024-04-01 LAB — URINE CULTURE

## 2024-04-03 ENCOUNTER — Other Ambulatory Visit

## 2024-04-03 ENCOUNTER — Ambulatory Visit: Payer: Self-pay | Admitting: Obstetrics & Gynecology

## 2024-04-03 ENCOUNTER — Encounter: Admitting: Obstetrics & Gynecology

## 2024-04-05 ENCOUNTER — Inpatient Hospital Stay (HOSPITAL_COMMUNITY)
Admission: AD | Admit: 2024-04-05 | Discharge: 2024-04-08 | DRG: 769 | Disposition: A | Discharge status: Home / Self Care | Attending: Obstetrics and Gynecology | Admitting: Obstetrics and Gynecology

## 2024-04-05 ENCOUNTER — Inpatient Hospital Stay (HOSPITAL_COMMUNITY)

## 2024-04-05 ENCOUNTER — Encounter (HOSPITAL_COMMUNITY): Payer: Self-pay | Admitting: Obstetrics and Gynecology

## 2024-04-05 DIAGNOSIS — I517 Cardiomegaly: Secondary | ICD-10-CM | POA: Diagnosis not present

## 2024-04-05 DIAGNOSIS — R918 Other nonspecific abnormal finding of lung field: Secondary | ICD-10-CM | POA: Diagnosis not present

## 2024-04-05 DIAGNOSIS — R1084 Generalized abdominal pain: Secondary | ICD-10-CM | POA: Diagnosis not present

## 2024-04-05 DIAGNOSIS — E876 Hypokalemia: Secondary | ICD-10-CM | POA: Diagnosis not present

## 2024-04-05 DIAGNOSIS — Z87891 Personal history of nicotine dependence: Secondary | ICD-10-CM | POA: Diagnosis not present

## 2024-04-05 DIAGNOSIS — K2970 Gastritis, unspecified, without bleeding: Secondary | ICD-10-CM | POA: Diagnosis not present

## 2024-04-05 DIAGNOSIS — K8012 Calculus of gallbladder with acute and chronic cholecystitis without obstruction: Secondary | ICD-10-CM | POA: Diagnosis present

## 2024-04-05 DIAGNOSIS — R7401 Elevation of levels of liver transaminase levels: Secondary | ICD-10-CM | POA: Diagnosis not present

## 2024-04-05 DIAGNOSIS — Z833 Family history of diabetes mellitus: Secondary | ICD-10-CM

## 2024-04-05 DIAGNOSIS — K8020 Calculus of gallbladder without cholecystitis without obstruction: Secondary | ICD-10-CM | POA: Diagnosis present

## 2024-04-05 DIAGNOSIS — I10 Essential (primary) hypertension: Secondary | ICD-10-CM | POA: Diagnosis not present

## 2024-04-05 DIAGNOSIS — F419 Anxiety disorder, unspecified: Secondary | ICD-10-CM | POA: Diagnosis not present

## 2024-04-05 DIAGNOSIS — R0602 Shortness of breath: Secondary | ICD-10-CM | POA: Diagnosis not present

## 2024-04-05 DIAGNOSIS — O99345 Other mental disorders complicating the puerperium: Secondary | ICD-10-CM | POA: Diagnosis present

## 2024-04-05 DIAGNOSIS — K810 Acute cholecystitis: Secondary | ICD-10-CM

## 2024-04-05 DIAGNOSIS — M545 Low back pain, unspecified: Secondary | ICD-10-CM | POA: Diagnosis not present

## 2024-04-05 DIAGNOSIS — K8000 Calculus of gallbladder with acute cholecystitis without obstruction: Principal | ICD-10-CM

## 2024-04-05 DIAGNOSIS — Z8249 Family history of ischemic heart disease and other diseases of the circulatory system: Secondary | ICD-10-CM

## 2024-04-05 DIAGNOSIS — O2663 Liver and biliary tract disorders in the puerperium: Principal | ICD-10-CM | POA: Diagnosis present

## 2024-04-05 DIAGNOSIS — R58 Hemorrhage, not elsewhere classified: Secondary | ICD-10-CM | POA: Diagnosis not present

## 2024-04-05 DIAGNOSIS — O135 Gestational [pregnancy-induced] hypertension without significant proteinuria, complicating the puerperium: Secondary | ICD-10-CM | POA: Diagnosis not present

## 2024-04-05 DIAGNOSIS — Z79899 Other long term (current) drug therapy: Secondary | ICD-10-CM

## 2024-04-05 DIAGNOSIS — O99285 Endocrine, nutritional and metabolic diseases complicating the puerperium: Secondary | ICD-10-CM | POA: Diagnosis not present

## 2024-04-05 DIAGNOSIS — D1803 Hemangioma of intra-abdominal structures: Secondary | ICD-10-CM | POA: Diagnosis not present

## 2024-04-05 DIAGNOSIS — J811 Chronic pulmonary edema: Secondary | ICD-10-CM | POA: Diagnosis not present

## 2024-04-05 DIAGNOSIS — M797 Fibromyalgia: Secondary | ICD-10-CM | POA: Diagnosis not present

## 2024-04-05 LAB — URINALYSIS, ROUTINE W REFLEX MICROSCOPIC
Bilirubin Urine: NEGATIVE
Glucose, UA: NEGATIVE mg/dL
Ketones, ur: NEGATIVE mg/dL
Nitrite: NEGATIVE
Protein, ur: NEGATIVE mg/dL
RBC / HPF: >50 RBC/hpf (ref 0–5)
Specific Gravity, Urine: 1.016 (ref 1.005–1.030)
pH: 7 (ref 5.0–8.0)

## 2024-04-05 LAB — CBC
HCT: 32.7 % — ABNORMAL LOW (ref 36.0–46.0)
Hemoglobin: 11.1 g/dL — ABNORMAL LOW (ref 12.0–15.0)
MCH: 30.8 pg (ref 26.0–34.0)
MCHC: 33.9 g/dL (ref 30.0–36.0)
MCV: 90.8 fL (ref 80.0–100.0)
Platelets: 157 K/uL (ref 150–400)
RBC: 3.6 MIL/uL — ABNORMAL LOW (ref 3.87–5.11)
RDW: 13.8 % (ref 11.5–15.5)
WBC: 7.7 K/uL (ref 4.0–10.5)
nRBC: 0 % (ref 0.0–0.2)

## 2024-04-05 LAB — COMPREHENSIVE METABOLIC PANEL WITH GFR
ALT: 126 U/L — ABNORMAL HIGH (ref 0–44)
AST: 116 U/L — ABNORMAL HIGH (ref 15–41)
Albumin: 3.3 g/dL — ABNORMAL LOW (ref 3.5–5.0)
Alkaline Phosphatase: 255 U/L — ABNORMAL HIGH (ref 38–126)
Anion gap: 10 (ref 5–15)
BUN: 8 mg/dL (ref 6–20)
CO2: 23 mmol/L (ref 22–32)
Calcium: 8.6 mg/dL — ABNORMAL LOW (ref 8.9–10.3)
Chloride: 107 mmol/L (ref 98–111)
Creatinine, Ser: 0.89 mg/dL (ref 0.44–1.00)
GFR, Estimated: >60 mL/min (ref >60)
Glucose, Bld: 88 mg/dL (ref 70–99)
Potassium: 3.5 mmol/L (ref 3.5–5.1)
Sodium: 140 mmol/L (ref 135–145)
Total Bilirubin: 2.1 mg/dL — ABNORMAL HIGH (ref 0.0–1.2)
Total Protein: 6.4 g/dL — ABNORMAL LOW (ref 6.5–8.1)

## 2024-04-05 LAB — LIPASE, BLOOD: Lipase: 26 U/L (ref 11–51)

## 2024-04-05 LAB — BILIRUBIN, DIRECT: Bilirubin, Direct: 0.9 mg/dL — ABNORMAL HIGH (ref 0.0–0.2)

## 2024-04-05 LAB — AMYLASE: Amylase: 43 U/L (ref 28–100)

## 2024-04-05 MED ORDER — ONDANSETRON HCL 4 MG/2ML IJ SOLN
4.0000 mg | Freq: Once | INTRAMUSCULAR | Status: AC
  Administered 2024-04-05: 4 mg via INTRAVENOUS
  Filled 2024-04-05: qty 2

## 2024-04-05 MED ORDER — HYDROMORPHONE HCL 1 MG/ML IJ SOLN
1.0000 mg | Freq: Once | INTRAMUSCULAR | Status: AC
  Administered 2024-04-05: 1 mg via INTRAVENOUS
  Filled 2024-04-05: qty 1

## 2024-04-05 MED ORDER — ACETAMINOPHEN 325 MG PO TABS
650.0000 mg | ORAL_TABLET | Freq: Four times a day (QID) | ORAL | Status: DC | PRN
  Administered 2024-04-05 – 2024-04-07 (×5): 650 mg via ORAL
  Filled 2024-04-05 (×5): qty 2

## 2024-04-05 MED ORDER — SODIUM CHLORIDE 0.9 % IV SOLN
2.0000 g | INTRAVENOUS | Status: DC
  Administered 2024-04-05 – 2024-04-06 (×2): 2 g via INTRAVENOUS
  Filled 2024-04-05 (×2): qty 20

## 2024-04-05 MED ORDER — MORPHINE SULFATE (PF) 4 MG/ML IV SOLN
2.0000 mg | INTRAVENOUS | Status: DC | PRN
  Administered 2024-04-06: 2 mg via INTRAVENOUS
  Filled 2024-04-05: qty 1

## 2024-04-05 MED ORDER — HYDRALAZINE HCL 20 MG/ML IJ SOLN
10.0000 mg | INTRAMUSCULAR | Status: DC | PRN
  Administered 2024-04-05: 10 mg via INTRAVENOUS
  Filled 2024-04-05: qty 1

## 2024-04-05 MED ORDER — IBUPROFEN 800 MG PO TABS
800.0000 mg | ORAL_TABLET | Freq: Once | ORAL | Status: AC
  Administered 2024-04-05: 800 mg via ORAL
  Filled 2024-04-05: qty 1

## 2024-04-05 MED ORDER — LABETALOL HCL 5 MG/ML IV SOLN: 20.0000 mg | INTRAVENOUS | Status: DC | PRN

## 2024-04-05 MED ORDER — LACTATED RINGERS IV BOLUS
1000.0000 mL | Freq: Once | INTRAVENOUS | Status: AC
  Administered 2024-04-05: 1000 mL via INTRAVENOUS

## 2024-04-05 MED ORDER — HYDRALAZINE HCL 20 MG/ML IJ SOLN
5.0000 mg | INTRAMUSCULAR | Status: DC | PRN
  Administered 2024-04-05: 5 mg via INTRAVENOUS
  Filled 2024-04-05: qty 1

## 2024-04-05 MED ORDER — LABETALOL HCL 5 MG/ML IV SOLN: 40.0000 mg | INTRAVENOUS | Status: DC | PRN

## 2024-04-05 MED ORDER — DEXTROSE-SODIUM CHLORIDE 5-0.45 % IV SOLN: INTRAVENOUS | Status: AC

## 2024-04-05 MED ORDER — SODIUM CHLORIDE 0.9 % IV SOLN
8.0000 mg | Freq: Once | INTRAVENOUS | Status: DC
  Filled 2024-04-05: qty 4

## 2024-04-05 MED ORDER — ONDANSETRON HCL 4 MG/2ML IJ SOLN
4.0000 mg | Freq: Four times a day (QID) | INTRAMUSCULAR | Status: DC | PRN
Start: 2024-04-05 — End: 2024-04-08

## 2024-04-05 NOTE — Consult Note (Signed)
 Dominique Garza [birth year]  [id number].    Requesting MD: Zina Chief Complaint/Reason for Consult: Cholecystitis, possible Choledocholithiasis  HPI:  38 y/o F who is post-partum 14 days following an uncomplicated pregnancy and vaginal delivery. She reports that she developed severe RUQ pain on Monday morning followed by nausea. Since then she has had a decreased appetite and loose stools. She presented to the [facility] and labs were notable for a Tbili 2.1 (direct 0.9), WBC 7, Alk phos 255, ALT 126, AST 116.  She underwent a RUQ US  that showed cholelithiasis w/ evidence of cholecystitis and mild CBD dilation.   No abdominal surgical history. She is not on AC.  ROS: Review of Systems  Constitutional:  Positive for malaise/fatigue.  HENT: Negative.    Eyes: Negative.   Respiratory: Negative.    Cardiovascular: Negative.   Gastrointestinal:  Positive for abdominal pain, diarrhea and nausea.  Genitourinary: Negative.   Musculoskeletal: Negative.   Skin: Negative.   Neurological: Negative.   Endo/Heme/Allergies: Negative.   Psychiatric/Behavioral: Negative.      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 Her smoking use included cigarettes and cigars. She has never used smokeless tobacco. She reports that she does not drink alcohol and does not use drugs.  Allergies: No Known Allergies  Medications Prior to Admission  Medication Sig Dispense Refill    acetaminophen  (TYLENOL ) 500 MG tablet Take 2 tablets (1,000 mg total) by mouth every 6 (six) hours as needed for moderate pain (pain score 4-6). 30 tablet 0   furosemide  (LASIX ) 20 MG tablet Take 1 tablet (20 mg total) by mouth daily. 4 tablet 0   ibuprofen  (ADVIL ) 600 MG tablet Take 1 tablet (600 mg total) by mouth every 6 (six) hours as needed for moderate pain (pain score 4-6). 30 tablet 0   Blood Pressure Monitor MISC For regular home bp monitoring during pregnancy 1 each 0   ferrous sulfate  325 (65 FE) MG tablet Take 1 tablet (325 mg total) by mouth every other day. 45 tablet 2   potassium chloride  SA (KLOR-CON  M) 20 MEQ tablet Take 1 tablet (20 mEq total) by mouth daily. 4 tablet 0   Prenatal Vit-Fe Fumarate-FA (PRENATAL VITAMINS PLUS PO)      senna-docusate (SENOKOT-S) 8.6-50 MG tablet Take 2 tablets by mouth at bedtime as needed for mild constipation or moderate constipation. 30 tablet 0    Physical Exam: Blood pressure (!) 153/87, pulse 87, temperature 98.3 F (36.8 C), temperature source Oral, resp. rate (!) 30, height 5' 4 (1.626 m), weight 119.3 kg, SpO2 99%, currently breastfeeding. Gen: female, resting comfortably Abd: soft, non-distended, easily reducible umbilical hernia, TTP in the RUQ, + guarding, no peritonitis  Results for orders placed or performed during the hospital encounter of 04/05/24 (from the past 48 hours)  CBC  Status: Abnormal   Collection Time: 04/05/24  5:09 PM  Result Value Ref Range   WBC 7.7 4.0 - 10.5 K/uL   RBC 3.60 (L) 3.87 - 5.11 MIL/uL   Hemoglobin 11.1 (L) 12.0 - 15.0 g/dL   HCT 67.2 (L) 63.9 - 53.9 %   MCV 90.8 80.0 - 100.0 fL   MCH 30.8 26.0 - 34.0 pg   MCHC 33.9 30.0 - 36.0 g/dL   RDW 86.1 88.4 - 84.4 %   Platelets 157 150 - 400 K/uL   nRBC 0.0 0.0 - 0.2 %    Comment: Performed at [facility], [address] [place], [place] [address]  Comprehensive metabolic panel     Status: Abnormal   Collection Time: 04/05/24  5:09 PM   Result Value Ref Range   Sodium 140 135 - 145 mmol/L   Potassium 3.5 3.5 - 5.1 mmol/L   Chloride 107 98 - 111 mmol/L   CO2 23 22 - 32 mmol/L   Glucose, Bld 88 70 - 99 mg/dL    Comment: Glucose reference range applies only to samples taken after fasting for at least 8 hours.   BUN 8 6 - 20 mg/dL   Creatinine, Ser 9.10 0.44 - 1.00 mg/dL   Calcium 8.6 (L) 8.9 - 10.3 mg/dL   Total Protein 6.4 (L) 6.5 - 8.1 g/dL   Albumin 3.3 (L) 3.5 - 5.0 g/dL   AST 883 (H) 15 - 41 U/L   ALT 126 (H) 0 - 44 U/L   Alkaline Phosphatase 255 (H) 38 - 126 U/L   Total Bilirubin 2.1 (H) 0.0 - 1.2 mg/dL   GFR, Estimated >39 >39 mL/min    Comment: (NOTE) Calculated using the CKD-EPI Creatinine Equation (2021)    Anion gap 10 5 - 15    Comment: Performed at [facility], [address], [place], [place] [address]  Lipase, blood     Status: None   Collection Time: 04/05/24  5:09 PM  Result Value Ref Range   Lipase 26 11 - 51 U/L    Comment: Performed at [facility], [address], [place], [place] [address]  Amylase     Status: None   Collection Time: 04/05/24  5:09 PM  Result Value Ref Range   Amylase 43 28 - 100 U/L    Comment: Performed at [facility], [address] [place], [place] [address]  Bilirubin, direct     Status: Abnormal   Collection Time: 04/05/24  5:09 PM  Result Value Ref Range   Bilirubin, Direct 0.9 (H) 0.0 - 0.2 mg/dL    Comment: Performed at [facility], [address], [place], [place] [address]  Urinalysis, Routine w reflex microscopic -Urine, Clean Catch     Status: Abnormal   Collection Time: 04/05/24  5:28 PM  Result Value Ref Range   Color, Urine YELLOW YELLOW   APPearance HAZY (A) CLEAR   Specific Gravity, Urine 1.016 1.005 - 1.030   pH 7.0 5.0 - 8.0   Glucose, UA NEGATIVE NEGATIVE mg/dL   Hgb urine dipstick LARGE (A) NEGATIVE   Bilirubin Urine NEGATIVE NEGATIVE   Ketones, ur NEGATIVE NEGATIVE mg/dL   Protein, ur NEGATIVE NEGATIVE mg/dL    Nitrite NEGATIVE NEGATIVE   Leukocytes,Ua SMALL (A) NEGATIVE   RBC / HPF >50 0 - 5 RBC/hpf   WBC, UA 21-50 0 - 5 WBC/hpf   Bacteria, UA MANY (A) NONE SEEN  Squamous Epithelial / HPF 0-5 0 - 5 /HPF   Mucus PRESENT     Comment: Performed at [facility], [address], [place], [place] [address]   US  ABDOMEN LIMITED RUQ (LIVER/GB) Result Date: 04/05/2024 EXAM: Right Upper Quadrant Abdominal Ultrasound 04/05/2024 07:53:57 PM TECHNIQUE: Real-time ultrasonography of the right upper quadrant of the abdomen was performed. COMPARISON: CT abdomen and pelvis 02/07/2023. CLINICAL HISTORY: RUQ pain; Total bilirubin, elevated. 2 weeks postpartum. FINDINGS: LIVER: The liver demonstrates normal echogenicity. Mild intrahepatic bile duct dilatation. No evidence of mass. BILIARY SYSTEM: Cholelithiasis with small stones in the gallbladder. Gallbladder wall thickening with mild pericholecystic edema. The gallbladder wall measures 6.5 mm in thickness. Increased vascularity suggested on color flow doppler imaging. Murphy sign is positive. Bile ducts are mildly dilated, with the common bile duct diameter measured at 7.6 mm. The cause is not identified. RIGHT KIDNEY: The right kidney is grossly unremarkable in appearances without evidence of hydronephrosis, echogenic calculi or worrisome mass lesions. PANCREAS: Visualized portions of the pancreas are unremarkable. OTHER: No right upper quadrant ascites. Incidental finding of small right pleural effusion. IMPRESSION: 1. Cholelithiasis with gallbladder wall thickening, mild pericholecystic edema, hyperemia, and positive sonographic Murphy sign, consistent with acute cholecystitis. 2. Mildly dilated intrahepatic and extrahepatic bile ducts with a 7.6 mm common bile duct, etiology not identified. 3. Small right pleural effusion. Electronically signed by: Elsie Gravely MD 04/05/2024 07:59 PM EST RP Workstation: [id number]    Assessment/Plan 37 y/o F 14 days post partum  who presented with abdominal pain and nausea and has evidence of cholecystitis with possible choledocholithiasis or possible recent passage of a stone  - Admit to medicine - Rocephin  - Repeat Tbili/LFTs in AM - NPO at [place] - Surgery will follow up in the AM to determine surgery versus need for additional imaging and possible intervention  Cordella DELENA Dominique Garza Surgery 04/05/2024, 8:28 PM Please see Amion for pager number during day hours 7:00am-4:30pm or 7:00am -11:30am on weekends

## 2024-04-05 NOTE — MAU Provider Note (Signed)
 Chief Complaint:  Abdominal Pain   HPI   None     Dominique Garza is a 38 y.o. [id number] at 14 days postpartum who presents to maternity admissions reporting RUQ pain, brown urine, and pain with breathing.  She reports that yesterday morning, she felt a pop sensation in her RUQ under her right breast, immediately vomited, and has been in 10/10 RUQ pain since that time.  The pain makes it difficult to breathe deeply.  She describes the pain as sharp and constant.  She denies headache, vision changes, fever, chills, chest pain, cough.  She reports that her urine has been a brown to orange color the past couple of days and having a foul smell.  Pregnancy Course: Receives care at [facility] for [facility] . Prenatal records reviewed.  Pregnancy complicated by gestational hypertension, history of preeclampsia, AMA, and anemia.  Past Medical History:  Diagnosis Date   Acne 12/27/2014   Anemia    Anxiety    Fibromyalgia    Hives of unknown origin 12/27/2014   HSV-2 seropositive    Ovarian cyst    Pregnancy induced hypertension    UTI (urinary tract infection)    OB History  Gravida Para Term Preterm AB Living  6 5 4 1 1 5   SAB IAB Ectopic Multiple Live Births  1   0 5    # Outcome Date GA Lbr Len/2nd Weight Sex Type Anes PTL Lv  6 Term 03/22/24 [redacted]w[redacted]d / 00:12 3240 g M Vag-Spont EPI  LIV  5 Term 09/02/14 [redacted]w[redacted]d / 00:41 2715 g F Vag-Spont EPI  LIV  4 SAB 07/2013          3 Term 02/15/11 [redacted]w[redacted]d  2892 g M Vag-Spont EPI  LIV  2 Preterm 02/26/10 [redacted]w[redacted]d  1673 g F Vag-Spont EPI  LIV     Birth Comments: d/t pre-e  1 Term 02/01/08 [redacted]w[redacted]d  2665 g F Vag-Spont EPI  LIV    Obstetric Comments  Pre E with 2nd and 4th preg   Past Surgical History:  Procedure Laterality Date   TONSILLECTOMY     WISDOM TOOTH EXTRACTION Right 2022   Family History  Problem Relation Age of Onset   Fibromyalgia Mother    Sarcoidosis Mother    Hypertension Father    Diabetes Father    Diabetes Paternal  Grandmother    Social History   Tobacco Use   Smoking status: Former    Types: Cigarettes, Cigars   Smokeless tobacco: Never   Tobacco comments:    Smoked for about a year  [employer]   Vaping status: Never Used  Substance Use Topics   Alcohol use: No   Drug use: No   No Known Allergies Medications Prior to Admission  Medication Sig Dispense Refill Last Dose/Taking   acetaminophen  (TYLENOL ) 500 MG tablet Take 2 tablets (1,000 mg total) by mouth every 6 (six) hours as needed for moderate pain (pain score 4-6). 30 tablet 0 Past Week   furosemide  (LASIX ) 20 MG tablet Take 1 tablet (20 mg total) by mouth daily. 4 tablet 0 Past Month   ibuprofen  (ADVIL ) 600 MG tablet Take 1 tablet (600 mg total) by mouth every 6 (six) hours as needed for moderate pain (pain score 4-6). 30 tablet 0 04/04/2024   Blood Pressure Monitor MISC For regular home bp monitoring during pregnancy 1 each 0    ferrous sulfate  325 (65 FE) MG tablet Take 1 tablet (325 mg total) by  mouth every other day. 45 tablet 2    potassium chloride  SA (KLOR-CON  M) 20 MEQ tablet Take 1 tablet (20 mEq total) by mouth daily. 4 tablet 0    Prenatal Vit-Fe Fumarate-FA (PRENATAL VITAMINS PLUS PO)       senna-docusate (SENOKOT-S) 8.6-50 MG tablet Take 2 tablets by mouth at bedtime as needed for mild constipation or moderate constipation. 30 tablet 0    I have reviewed patient's Past Medical Hx, Surgical Hx, Family Hx, Social Hx, medications and allergies.   ROS  Pertinent items noted in HPI and remainder of comprehensive ROS otherwise negative.   PHYSICAL EXAM  Patient Vitals for the past 24 hrs:  BP Temp Temp src Pulse Resp SpO2 Height Weight  04/05/24 2002 (!) 153/87 -- -- 87 -- -- -- --  04/05/24 1916 (!) 153/73 -- -- 90 -- -- -- --  04/05/24 1900 (!) 157/73 -- -- 90 -- 99 % -- --  04/05/24 1850 -- -- -- -- -- 99 % -- --  04/05/24 1845 (!) 152/71 -- -- 89 -- 99 % -- --  04/05/24 1840 -- -- -- -- -- 99 % -- --  04/05/24 1835 (!)  172/77 -- -- 94 -- 99 % -- --  04/05/24 1830 -- -- -- -- -- 99 % -- --  04/05/24 1820 -- -- -- -- -- 99 % -- --  04/05/24 1815 -- -- -- -- -- 99 % -- --  04/05/24 1810 -- -- -- -- -- 99 % -- --  04/05/24 1805 -- -- -- -- -- 99 % -- --  04/05/24 1800 -- -- -- -- -- 99 % -- --  04/05/24 1755 -- -- -- -- -- 99 % -- --  04/05/24 1750 -- -- -- -- -- 99 % -- --  04/05/24 1745 (!) 157/65 -- -- 87 -- 99 % -- --  04/05/24 1740 -- -- -- -- -- 99 % -- --  04/05/24 1735 -- -- -- -- -- 98 % -- --  04/05/24 1730 (!) 163/75 -- -- 75 -- 99 % -- --  04/05/24 1725 -- -- -- -- -- 99 % -- --  04/05/24 1720 -- -- -- -- -- 98 % -- --  04/05/24 1715 (!) 172/77 -- -- 77 -- 99 % -- --  04/05/24 1710 (!) 185/86 -- -- 61 -- 99 % -- --  04/05/24 1650 (!) 188/91 -- -- 66 -- -- -- --  04/05/24 1620 (!) 189/86 -- -- 67 -- 98 % -- --  04/05/24 1617 -- 98.3 F (36.8 C) Oral -- (!) 30 98 % 5' 4 (1.626 m) 119.3 kg    Constitutional: Well-developed, well-nourished female visibly in discomfort.  HEENT: atraumatic, normocephalic. Neck has normal ROM. EOM intact. Cardiovascular: normal rate & rhythm, warm and well-perfused Respiratory: normal effort, increased rate GI: Distended, RUQ tenderness and periumbilical tenderness with guarding. MSK: Extremities nontender, no edema, normal ROM Skin: warm and dry. Acyanotic, no jaundice or pallor. Neurologic: Alert and oriented x 4. No abnormal coordination. Psychiatric: Normal mood. Speech not slurred, not rapid/pressured. Patient is cooperative.  Labs: Results for orders placed or performed during the hospital encounter of 04/05/24 (from the past 24 hours)  CBC     Status: Abnormal   Collection Time: 04/05/24  5:09 PM  Result Value Ref Range   WBC 7.7 4.0 - 10.5 K/uL   RBC 3.60 (L) 3.87 - 5.11 MIL/uL   Hemoglobin 11.1 (L) 12.0 - 15.0 g/dL  HCT 32.7 (L) 36.0 - 46.0 %   MCV 90.8 80.0 - 100.0 fL   MCH 30.8 26.0 - 34.0 pg   MCHC 33.9 30.0 - 36.0 g/dL   RDW 86.1 88.4 -  84.4 %   Platelets 157 150 - 400 K/uL   nRBC 0.0 0.0 - 0.2 %  Comprehensive metabolic panel     Status: Abnormal   Collection Time: 04/05/24  5:09 PM  Result Value Ref Range   Sodium 140 135 - 145 mmol/L   Potassium 3.5 3.5 - 5.1 mmol/L   Chloride 107 98 - 111 mmol/L   CO2 23 22 - 32 mmol/L   Glucose, Bld 88 70 - 99 mg/dL   BUN 8 6 - 20 mg/dL   Creatinine, Ser 9.10 0.44 - 1.00 mg/dL   Calcium 8.6 (L) 8.9 - 10.3 mg/dL   Total Protein 6.4 (L) 6.5 - 8.1 g/dL   Albumin 3.3 (L) 3.5 - 5.0 g/dL   AST 883 (H) 15 - 41 U/L   ALT 126 (H) 0 - 44 U/L   Alkaline Phosphatase 255 (H) 38 - 126 U/L   Total Bilirubin 2.1 (H) 0.0 - 1.2 mg/dL   GFR, Estimated >39 >39 mL/min   Anion gap 10 5 - 15  Lipase, blood     Status: None   Collection Time: 04/05/24  5:09 PM  Result Value Ref Range   Lipase 26 11 - 51 U/L  Amylase     Status: None   Collection Time: 04/05/24  5:09 PM  Result Value Ref Range   Amylase 43 28 - 100 U/L  Bilirubin, direct     Status: Abnormal   Collection Time: 04/05/24  5:09 PM  Result Value Ref Range   Bilirubin, Direct 0.9 (H) 0.0 - 0.2 mg/dL  Urinalysis, Routine w reflex microscopic -Urine, Clean Catch     Status: Abnormal   Collection Time: 04/05/24  5:28 PM  Result Value Ref Range   Color, Urine YELLOW YELLOW   APPearance HAZY (A) CLEAR   Specific Gravity, Urine 1.016 1.005 - 1.030   pH 7.0 5.0 - 8.0   Glucose, UA NEGATIVE NEGATIVE mg/dL   Hgb urine dipstick LARGE (A) NEGATIVE   Bilirubin Urine NEGATIVE NEGATIVE   Ketones, ur NEGATIVE NEGATIVE mg/dL   Protein, ur NEGATIVE NEGATIVE mg/dL   Nitrite NEGATIVE NEGATIVE   Leukocytes,Ua SMALL (A) NEGATIVE   RBC / HPF >50 0 - 5 RBC/hpf   WBC, UA 21-50 0 - 5 WBC/hpf   Bacteria, UA MANY (A) NONE SEEN   Squamous Epithelial / HPF 0-5 0 - 5 /HPF   Mucus PRESENT     Imaging:  US  ABDOMEN LIMITED RUQ (LIVER/GB) Result Date: 04/05/2024 EXAM: Right Upper Quadrant Abdominal Ultrasound 04/05/2024 07:53:57 PM TECHNIQUE:  Real-time ultrasonography of the right upper quadrant of the abdomen was performed. COMPARISON: CT abdomen and pelvis 02/07/2023. CLINICAL HISTORY: RUQ pain; Total bilirubin, elevated. 2 weeks postpartum. FINDINGS: LIVER: The liver demonstrates normal echogenicity. Mild intrahepatic bile duct dilatation. No evidence of mass. BILIARY SYSTEM: Cholelithiasis with small stones in the gallbladder. Gallbladder wall thickening with mild pericholecystic edema. The gallbladder wall measures 6.5 mm in thickness. Increased vascularity suggested on color flow doppler imaging. Murphy sign is positive. Bile ducts are mildly dilated, with the common bile duct diameter measured at 7.6 mm. The cause is not identified. RIGHT KIDNEY: The right kidney is grossly unremarkable in appearances without evidence of hydronephrosis, echogenic calculi or worrisome mass lesions. PANCREAS: Visualized  portions of the pancreas are unremarkable. OTHER: No right upper quadrant ascites. Incidental finding of small right pleural effusion. IMPRESSION: 1. Cholelithiasis with gallbladder wall thickening, mild pericholecystic edema, hyperemia, and positive sonographic Murphy sign, consistent with acute cholecystitis. 2. Mildly dilated intrahepatic and extrahepatic bile ducts with a 7.6 mm common bile duct, etiology not identified. 3. Small right pleural effusion. Electronically signed by: Elsie Gravely MD 04/05/2024 07:59 PM EST RP Workstation: [id number]    MDM & MAU COURSE  MDM: High  MAU Course: -Significantly elevated BP and tachypnea. Afebrile. Suspect that elevated BP is secondary to pain and pathology, but will treat if second BP is severe range. -CBC to rule out infection and acute anemia. CMP for liver/renal function. Lipase/amylase for pancreatitis. -UA for infection. -CBC without significant abnormalities. Mild anemia. -Amylase and lipase within normal limits. -Significantly elevated liver enzymes. Total bilirubin also elevated.  With elevated total bilirubin and timing of 14 days postpartum, low chance of preeclampsia or HELLP. Ordering RUQ US  for hepatobiliary pathology. Adding direct bilirubin, will calculate indirect bilirubin as no specific lab for it available. -I personally reviewed RUQ imaging. Gallbladder sludge, thickened gallbladder wall, and dilated CBD.  -Sonographer noted fluid surrounding the lungs on RUQ US , will get CXR. -Direct bilirubin 0.9, indirect bilirubin 1.2, total bilirubin 2.1. -Consulted Dr. Polly with general surgery. He will be by to evaluate the patient and also recommends contacting the hospitalist team for admission.   Differential diagnosis considered for RUQ pain includes but is not limited to: preeclampsia, HELLP syndrome, biliary colic, cholelithiasis, cholecystitis   Orders Placed This Encounter  Procedures   Culture, OB Urine   US  ABDOMEN LIMITED RUQ (LIVER/GB)   DG Chest Portable 1 View   CBC   Comprehensive metabolic panel   Lipase, blood   Amylase   Urinalysis, Routine w reflex microscopic -Urine, Clean Catch   Bilirubin, direct   Notify physician (specify) Confirmatory reading of BP> 160/110 15 minutes later   Apply Hypertensive Disorders of Pregnancy Care Plan   Measure blood pressure   Meds ordered this encounter  Medications   AND Linked Order Group    hydrALAZINE  (APRESOLINE ) injection 5 mg    hydrALAZINE  (APRESOLINE ) injection 10 mg    labetalol  (NORMODYNE ) injection 20 mg    labetalol  (NORMODYNE ) injection 40 mg   lactated ringers  bolus 1,000 mL   HYDROmorphone  (DILAUDID ) injection 1 mg   DISCONTD: ondansetron  (ZOFRAN ) 8 mg in sodium chloride  0.9 % 50 mL IVPB   ondansetron  (ZOFRAN ) injection 4 mg   ondansetron  (ZOFRAN ) injection 4 mg   ibuprofen  (ADVIL ) tablet 800 mg   lactated ringers  bolus 1,000 mL   HYDROmorphone  (DILAUDID ) injection 1 mg    ASSESSMENT   1. Calculus of gallbladder with acute cholecystitis without obstruction     PLAN  Admit  with hospitalist team. Dr. Polly with General Surgery to evaluate and determine next steps in management.   Allergies as of 04/05/2024   (No Known Allergies)    Joesph DELENA Sear, PA

## 2024-04-05 NOTE — MAU Note (Signed)
.  Dominique Garza is a 38 y.o. at [facility] here in MAU reporting: RUQ pain that pt describes as sharp and c/o SOB. Pt arrived to MAU via EMS. Pt stated when the pain came on yesterday morning; but she felt a pop sensation and then vomited afterwards, she says she felt better but the pain remained. Pt appears to be breathing fast, RR 25-30/min. States it hurts to breathe d/t the pain. Pt denies HA and vision changes, no apparent swelling. States her VB has been normal and period like however c/o her urine having a foul smell and looks old describing the color to be brown.  LMP: na Onset of complaint: 11/25 am Pain score: 10/10 Vitals:   04/05/24 1617  Resp: (!) 30  Temp: 98.3 F (36.8 C)  SpO2: 98%     FHT:   Lab orders placed from triage: na

## 2024-04-05 NOTE — H&P (Signed)
 History and Physical    Patient: Dominique Garza FMW:[id number] DOB: February 07, 1986 DOA: 04/05/2024 DOS: the patient was seen and examined on 04/05/2024 PCP: [name], FNP  Patient coming from: Home  Chief Complaint: Right upper quadrant pain Chief Complaint  Patient presents with   Abdominal Pain   HPI: Dominique Garza is a 38 y.o. female with medical history significant of HSV-2, fibromyalgia, anxiety, 2 weeks postpartum who has been otherwise well until 2 days ago when she started experiencing right upper quadrant abdominal pain with associated nausea and vomiting.  According to patient the pain worsened as well as the nausea and vomiting and therefore she decided to come in to the maternal assessment units for further management.  She denies fever, chest pain, diarrhea or urinary complaint.  Pain is of intensity 8/10, worse with movement however 5/10 when at rest relieved somewhat by pain medication administered upon arrival.    Upon arrival to the maternal assessment to urinate, patient underwent ultrasound that showed findings consistent with acute cholecystitis.  Labs showed transaminitis.  General surgeon was therefore contacted and requested for TRH to admit patient and they will monitor labs to finalize on surgical decision by tomorrow pending lab report. Vitals on arrival to the maternal assessment unit with temperature 98.3, respiratory rate 30, pulse 66, blood pressure 188/91 saturating 98% on room air.  Review of Systems: As mentioned in the history of present illness. All other systems reviewed and are negative. Past Medical History:  Diagnosis Date   Acne 12/27/2014   Anemia    Anxiety    Fibromyalgia    Hives of unknown origin 12/27/2014   HSV-2 seropositive    Ovarian cyst    Pregnancy induced hypertension    UTI (urinary tract infection)    Past Surgical History:  Procedure Laterality Date   TONSILLECTOMY     WISDOM TOOTH EXTRACTION Right 2022   Social  History:  reports that she has quit smoking. Her smoking use included cigarettes and cigars. She has never used smokeless tobacco. She reports that she does not drink alcohol and does not use drugs.  No Known Allergies  Family History  Problem Relation Age of Onset   Fibromyalgia Mother    Sarcoidosis Mother    Hypertension Father    Diabetes Father    Diabetes Paternal Grandmother     Prior to Admission medications   Medication Sig Start Date End Date Taking? Authorizing Provider  acetaminophen  (TYLENOL ) 500 MG tablet Take 2 tablets (1,000 mg total) by mouth every 6 (six) hours as needed for moderate pain (pain score 4-6). 03/23/24  Yes Hur, Marisa, DO  furosemide  (LASIX ) 20 MG tablet Take 1 tablet (20 mg total) by mouth daily. 03/24/24  Yes Hur, Marisa, DO  ibuprofen  (ADVIL ) 600 MG tablet Take 1 tablet (600 mg total) by mouth every 6 (six) hours as needed for moderate pain (pain score 4-6). 03/23/24  Yes Danny Geralds, DO  Blood Pressure Monitor MISC For regular home bp monitoring during pregnancy 10/05/23   Kizzie Suzen SAUNDERS, CNM  ferrous sulfate  325 (65 FE) MG tablet Take 1 tablet (325 mg total) by mouth every other day. 10/11/23   Kizzie Suzen SAUNDERS, CNM  potassium chloride  SA (KLOR-CON  M) 20 MEQ tablet Take 1 tablet (20 mEq total) by mouth daily. 03/24/24   Danny Geralds, DO  Prenatal Vit-Fe Fumarate-FA (PRENATAL VITAMINS PLUS PO)  08/11/23   [provider]  senna-docusate (SENOKOT-S) 8.6-50 MG tablet Take 2 tablets by  mouth at bedtime as needed for mild constipation or moderate constipation. 03/23/24   Danny Geralds, DO    Physical Exam: Vitals:   04/05/24 2002 04/05/24 2016 04/05/24 2031 04/05/24 2046  BP: (!) 153/87 (!) 150/76 133/74 (!) 149/80  Pulse: 87 81 82 73  Resp:      Temp:      TempSrc:      SpO2:      Weight:      Height:       Vitals and nursing note reviewed.  Constitutional: Laying in bed in no acute distress HENT:     Head: Normocephalic and atraumatic.   Cardiovascular:     Rate and Rhythm: Normal rate and regular rhythm.     Heart sounds: Normal heart sounds.  Pulmonary:     Effort: Pulmonary effort is normal.     Breath sounds: Normal breath sounds.  Abdominal: Right upper quadrant tenderness noted Neurological: Alert and oriented x 3 moving all extremities with no lateralizing signs  Data Reviewed: Abdominal ultrasound showing findings consistent with acute cholecystitis    Latest Ref Rng & Units 04/05/2024    5:09 PM 03/23/2024    4:26 AM 03/22/2024   11:42 AM  CBC  WBC 4.0 - 10.5 K/uL 7.7  10.8  13.1   Hemoglobin 12.0 - 15.0 g/dL 88.8  9.2  89.2   Hematocrit 36.0 - 46.0 % 32.7  26.4  31.2   Platelets 150 - 400 K/uL 157  124  131        Latest Ref Rng & Units 04/05/2024    5:09 PM 03/21/2024    5:44 PM 03/21/2024    6:49 AM  BMP  Glucose 70 - 99 mg/dL 88  98  891   BUN 6 - 20 mg/dL 8  6  7    Creatinine 0.44 - 1.00 mg/dL 9.10  9.21  9.14   Sodium 135 - 145 mmol/L 140  133  134   Potassium 3.5 - 5.1 mmol/L 3.5  3.7  3.7   Chloride 98 - 111 mmol/L 107  102  102   CO2 22 - 32 mmol/L 23  21  22    Calcium 8.9 - 10.3 mg/dL 8.6  9.1  9.8      Assessment and Plan:   Acute cholecystitis Ultrasound of the abdomen showed cholelithiasis with gallbladder wall thickening, mild pericholecystic edema, hyperemia, and positive sonographic Murphy sign, consistent with acute cholecystitis. General surgeon on board and case discussed We will keep n.p.o. after midnight Patient initiated on ceftriaxone  by general surgeon, we will continue We will keep on maintenance IV fluid Continue as needed Zofran  Continue current pain medication  Acute transaminitis this could be secondary to above or recent stone passage Patient presented with alkaline phosphatase 255, AST 116 ALT 126 total bilirubin of 2.1 Ultrasound showed cholelithiasis with only mildly dilated intrahepatic and extrahepatic bile ducts Monitor LFTs General Surgeon on board  and recommends monitoring of LFTs by tomorrow to decide on next steps.  History of HSV-2 Patient took a course of acyclovir  during pregnancy  Anxiety disorder  Does not take any home medication for this  Elevated blood pressure According to patient she does not take any medications and have not been officially diagnosed of hypertension however did have some elevated blood pressure during pregnancy We will continue on as needed antihypertensives We will monitor her blood pressure and if it remains elevated we will consider antihypertensives chronically  Postpartum Patient is 2 weeks postpartum  Baby at home with the father and doing well Mother continues to express breastmilk in the hospital No issues postpartum  DVT prophylaxis-placed on SCD given possible surgery   Advance Care Planning:   Code Status: Prior full code  Consults: General Surgery  Family Communication: None present at bedside  Severity of Illness: The appropriate patient status for this patient is INPATIENT. Inpatient status is judged to be reasonable and necessary in order to provide the required intensity of service to ensure the patient's safety. The patient's presenting symptoms, physical exam findings, and initial radiographic and laboratory data in the context of their chronic comorbidities is felt to place them at high risk for further clinical deterioration. Furthermore, it is not anticipated that the patient will be medically stable for discharge from the hospital within 2 midnights of admission.   * I certify that at the point of admission it is my clinical judgment that the patient will require inpatient hospital care spanning beyond 2 midnights from the point of admission due to high intensity of service, high risk for further deterioration and high frequency of surveillance required.*  Author: Drue ONEIDA Potter, MD 04/05/2024 10:19 PM  For on call review [email or web address].

## 2024-04-06 ENCOUNTER — Inpatient Hospital Stay (HOSPITAL_COMMUNITY)

## 2024-04-06 DIAGNOSIS — K810 Acute cholecystitis: Secondary | ICD-10-CM | POA: Diagnosis not present

## 2024-04-06 DIAGNOSIS — K8001 Calculus of gallbladder with acute cholecystitis with obstruction: Secondary | ICD-10-CM

## 2024-04-06 DIAGNOSIS — K8000 Calculus of gallbladder with acute cholecystitis without obstruction: Secondary | ICD-10-CM | POA: Diagnosis not present

## 2024-04-06 LAB — CBC
HCT: 30 % — ABNORMAL LOW (ref 36.0–46.0)
Hemoglobin: 10.2 g/dL — ABNORMAL LOW (ref 12.0–15.0)
MCH: 30.7 pg (ref 26.0–34.0)
MCHC: 34 g/dL (ref 30.0–36.0)
MCV: 90.4 fL (ref 80.0–100.0)
Platelets: 136 K/uL — ABNORMAL LOW (ref 150–400)
RBC: 3.32 MIL/uL — ABNORMAL LOW (ref 3.87–5.11)
RDW: 13.9 % (ref 11.5–15.5)
WBC: 5.4 K/uL (ref 4.0–10.5)
nRBC: 0 % (ref 0.0–0.2)

## 2024-04-06 LAB — COMPREHENSIVE METABOLIC PANEL WITH GFR
ALT: 125 U/L — ABNORMAL HIGH (ref 0–44)
AST: 132 U/L — ABNORMAL HIGH (ref 15–41)
Albumin: 2.8 g/dL — ABNORMAL LOW (ref 3.5–5.0)
Alkaline Phosphatase: 238 U/L — ABNORMAL HIGH (ref 38–126)
Anion gap: 11 (ref 5–15)
BUN: 7 mg/dL (ref 6–20)
CO2: 24 mmol/L (ref 22–32)
Calcium: 8.2 mg/dL — ABNORMAL LOW (ref 8.9–10.3)
Chloride: 105 mmol/L (ref 98–111)
Creatinine, Ser: 0.95 mg/dL (ref 0.44–1.00)
GFR, Estimated: >60 mL/min (ref >60)
Glucose, Bld: 82 mg/dL (ref 70–99)
Potassium: 3.1 mmol/L — ABNORMAL LOW (ref 3.5–5.1)
Sodium: 140 mmol/L (ref 135–145)
Total Bilirubin: 3 mg/dL — ABNORMAL HIGH (ref 0.0–1.2)
Total Protein: 5.4 g/dL — ABNORMAL LOW (ref 6.5–8.1)

## 2024-04-06 MED ORDER — OXYCODONE HCL 5 MG PO TABS
5.0000 mg | ORAL_TABLET | ORAL | Status: DC | PRN
  Administered 2024-04-08: 5 mg via ORAL
  Filled 2024-04-06: qty 1

## 2024-04-06 MED ORDER — POTASSIUM CHLORIDE 10 MEQ/100ML IV SOLN
10.0000 meq | INTRAVENOUS | Status: AC
  Administered 2024-04-06 (×4): 10 meq via INTRAVENOUS
  Filled 2024-04-06 (×4): qty 100

## 2024-04-06 MED ORDER — GADOBUTROL 1 MMOL/ML IV SOLN
10.0000 mL | Freq: Once | INTRAVENOUS | Status: AC | PRN
  Administered 2024-04-06: 10 mL via INTRAVENOUS

## 2024-04-06 NOTE — Progress Notes (Signed)
 Subjective: Having a HA, otherwise still with epigastric abdominal pain.  ROS: See above, otherwise other systems negative  Objective: Vital signs in last 24 hours: Temp:  [97.9 F (36.6 C)-98.4 F (36.9 C)] 97.9 F (36.6 C) (11/27 0416) Pulse Rate:  [61-94] 73 (11/27 0416) Resp:  [16-30] 16 (11/27 0100) BP: (133-189)/(65-91) 143/75 (11/27 0416) SpO2:  [96 %-100 %] 100 % (11/27 0416) Weight:  [119.3 kg] 119.3 kg (11/26 1617) Last BM Date : 04/05/24  Intake/Output from previous day: 11/26 0701 - 11/27 0700 In: 336.7 [I.V.:236.7; IV Piggyback:100] Out: -  Intake/Output this shift: No intake/output data recorded.  PE: Gen: NAD Lungs: respiratory effort nonlabored Abd: soft, tender in epigastrium and some in central abdomen, post partum distention as expected  Lab Results:  Recent Labs    04/05/24 1709 04/06/24 0424  WBC 7.7 5.4  HGB 11.1* 10.2*  HCT 32.7* 30.0*  PLT 157 136*   BMET Recent Labs    04/05/24 1709 04/06/24 0424  NA 140 140  K 3.5 3.1*  CL 107 105  CO2 23 24  GLUCOSE 88 82  BUN 8 7  CREATININE 0.89 0.95  CALCIUM 8.6* 8.2*   PT/INR No results for input(s): LABPROT, INR in the last 72 hours. CMP     Component Value Date/Time   NA 140 04/06/2024 0424   NA 135 08/13/2023 1401   K 3.1 (L) 04/06/2024 0424   CL 105 04/06/2024 0424   CO2 24 04/06/2024 0424   GLUCOSE 82 04/06/2024 0424   BUN 7 04/06/2024 0424   BUN 8 08/13/2023 1401   CREATININE 0.95 04/06/2024 0424   CALCIUM 8.2 (L) 04/06/2024 0424   PROT 5.4 (L) 04/06/2024 0424   PROT 6.8 08/13/2023 1401   ALBUMIN 2.8 (L) 04/06/2024 0424   ALBUMIN 4.1 08/13/2023 1401   AST 132 (H) 04/06/2024 0424   ALT 125 (H) 04/06/2024 0424   ALKPHOS 238 (H) 04/06/2024 0424   BILITOT 3.0 (H) 04/06/2024 0424   BILITOT 0.3 08/13/2023 1401   GFRNONAA >60 04/06/2024 0424   GFRAA >60 11/23/2017 0243   Lipase     Component Value Date/Time   LIPASE 26 04/05/2024 1709        Studies/Results: DG Chest Portable 1 View Result Date: 04/05/2024 EXAM: 1 VIEW(S) XRAY OF THE CHEST 04/05/2024 08:31:00 PM COMPARISON: 11/23/2017 CLINICAL HISTORY: SOB, fluid around lungs on RUQ US  FINDINGS: LUNGS AND PLEURA: Hazy lower lung opacities. Small right pleural effusion. Mild pulmonary edema. No pneumothorax. HEART AND MEDIASTINUM: Cardiomegaly. BONES AND SOFT TISSUES: No acute osseous abnormality. IMPRESSION: 1. Hazy lower lung opacities, small right pleural effusion, and mild pulmonary edema. 2. Cardiomegaly. Electronically signed by: Oneil Devonshire MD 04/05/2024 08:34 PM EST RP Workstation: [id number]   US  ABDOMEN LIMITED RUQ (LIVER/GB) Result Date: 04/05/2024 EXAM: Right Upper Quadrant Abdominal Ultrasound 04/05/2024 07:53:57 PM TECHNIQUE: Real-time ultrasonography of the right upper quadrant of the abdomen was performed. COMPARISON: CT abdomen and pelvis 02/07/2023. CLINICAL HISTORY: RUQ pain; Total bilirubin, elevated. 2 weeks postpartum. FINDINGS: LIVER: The liver demonstrates normal echogenicity. Mild intrahepatic bile duct dilatation. No evidence of mass. BILIARY SYSTEM: Cholelithiasis with small stones in the gallbladder. Gallbladder wall thickening with mild pericholecystic edema. The gallbladder wall measures 6.5 mm in thickness. Increased vascularity suggested on color flow doppler imaging. Murphy sign is positive. Bile ducts are mildly dilated, with the common bile duct diameter measured at 7.6 mm. The cause is not identified. RIGHT KIDNEY: The right kidney is  grossly unremarkable in appearances without evidence of hydronephrosis, echogenic calculi or worrisome mass lesions. PANCREAS: Visualized portions of the pancreas are unremarkable. OTHER: No right upper quadrant ascites. Incidental finding of small right pleural effusion. IMPRESSION: 1. Cholelithiasis with gallbladder wall thickening, mild pericholecystic edema, hyperemia, and positive sonographic Murphy sign,  consistent with acute cholecystitis. 2. Mildly dilated intrahepatic and extrahepatic bile ducts with a 7.6 mm common bile duct, etiology not identified. 3. Small right pleural effusion. Electronically signed by: Elsie Gravely MD 04/05/2024 07:59 PM EST RP Workstation: [id number]    Anti-infectives: Anti-infectives (From admission, onward)    Start     Dose/Rate Route Frequency Ordered Stop   04/05/24 2300  cefTRIAXone  (ROCEPHIN ) 2 g in sodium chloride  0.9 % 100 mL IVPB        2 g 200 mL/hr over 30 Minutes Intravenous Every 24 hours 04/05/24 2214          Assessment/Plan Cholelithiasis with elevated LFTs -MRCP to rule out CBD stone given further elevation of TB on today's labs.  Other LFTs stable -may have CLD -NPO p MN -will need GI consult if MRCP positive for CBD stone for evaluation for ERCP -WBC normal, on Rocephin  for concern for mild gb wall thickening on imaging -labs in am   FEN - CLD, NPO p MN VTE - ok for chemical prophylaxis from our standpoint ID - rocephin   2 weeks post partum  I reviewed hospitalist notes, last 24 h vitals and pain scores, last 48 h intake and output, last 24 h labs and trends, and last 24 h imaging results.   LOS: 1 day    Burnard FORBES Banter , [facility] Surgery 04/06/2024, 9:06 AM Please see Amion for pager number during day hours 7:00am-4:30pm or 7:00am -11:30am on weekends

## 2024-04-06 NOTE — Plan of Care (Signed)
  Problem: Education: Goal: Knowledge of disease or condition will improve Outcome: Progressing   Problem: Health Behavior/Discharge Planning: Goal: Ability to manage health-related needs will improve Outcome: Progressing   Problem: Activity: Goal: Risk for activity intolerance will decrease Outcome: Progressing   Problem: Elimination: Goal: Will not experience complications related to urinary retention Outcome: Progressing   Problem: Pain Managment: Goal: General experience of comfort will improve and/or be controlled Outcome: Progressing   Problem: Safety: Goal: Ability to remain free from injury will improve Outcome: Progressing

## 2024-04-06 NOTE — Progress Notes (Signed)
 PROGRESS NOTE  Dominique Garza  DOB: Jun 04, 1985  PCP: [name], FNP FMW:[id number]  DOA: 04/05/2024  LOS: 1 day  Hospital Day: 2  Subjective: Patient was seen and examined this morning.  Pleasant young African-American female.  Lying down in bed. Not in distress currently. Mild epigastric pain continues Overnight, afebrile, heart rate in 60s and 70s, blood pressure in the 140s Labs this morning with WBC count 5.4, hemoglobin 10.2, platelet 136, potassium 3.1, LFTs and bilirubin remain elevated    Brief narrative: Dominique Garza is a 38 y.o. female with PMH significant for HSV-2, fibromyalgia, pregnancy-induced hypertension, anxiety who is 2 weeks postpartum following an uncomplicated pregnancy and vaginal delivery. 11/26, patient presented to the [facility] with complaint of 2 days of RUQ pain, nausea, vomiting.  Initially, patient was afebrile heart rate in 60s, blood pressure elevated to 180s, breathing on room air Initial labs with WBC count 7.7, hemoglobin 11.1, renal function normal, lipase normal at 26, AST, ALT, alk phos, total bili elevated Urinalysis with hazy yellow urine with large hemoglobin, small leukocytes, many bacteria Chest x-ray showed hazy lower lung opacities, small right pleural effusion, and mild pulmonary edema.  1. Cholelithiasis with gallbladder wall thickening, mild pericholecystic edema, hyperemia, and positive sonographic Murphy sign, consistent with acute cholecystitis. 2. Mildly dilated intrahepatic and extrahepatic bile ducts with a 7.6 mm common bile duct, etiology not identified.  Patient was admitted to [facility] General surgery was consulted  Assessment and plan: Acute cholecystitis Suspected choledocholithiasis 2 weeks postpartum presented with nausea, vomiting, abdominal pain  LFTs including alk phos and bilirubin elevated Ultrasound findings as above with acute cholecystitis, cholelithiasis as well as mildly dilated CBD.  Could be having  choledocholithiasis versus passed a stone. MRCP pending.  If MRCP positive for CBD stone, needs evaluation by GI for ERCP Currently on IV Rocephin ,  Clear liquid diet allowed.  Continue IV hydration LFT trend as below, not improving General Surgery to follow-up for possible need of surgical intervention Recent Labs  Lab 04/05/24 1709 04/06/24 0424  AST 116* 132*  ALT 126* 125*  ALKPHOS 255* 238*  BILITOT 2.1* 3.0*  BILIDIR 0.9*  --   PROT 6.4* 5.4*  ALBUMIN 3.3* 2.8*  LIPASE 26  --   PLT 157 136*   Hypokalemia Potassium low at 3.1 today.  Replacement ordered. Recent Labs  Lab 04/05/24 1709 04/06/24 0424  K 3.5 3.1*   2 weeks postpartum Patient is 2 weeks postpartum  Baby at home with the father and doing well Mother continues to express breastmilk in the hospital No issues postpartum  Elevated blood pressure H/o pregnancy-induced hypertension Was not on antihypertensives at home  Initial blood pressure was elevated to 180s, overnight in 140s. Continue to monitor  H/o HSV-2 Patient took a course of acyclovir  during pregnancy   Anxiety disorder Fibromyalgia Does not take any home medication for this   Mild anemia Continue to monitor Recent Labs    03/21/24 1744 03/22/24 1142 03/23/24 0426 04/05/24 1709 04/06/24 0424  HGB 10.4* 10.7* 9.2* 11.1* 10.2*  MCV 90.4 91.0 90.1 90.8 90.4    Mobility: Independent able to ambulate.  Encourage ambulation  Goals of care   Code Status: Full Code     DVT prophylaxis:  SCDs Start: 04/05/24 2225   Antimicrobials: IV Rocephin  Fluid: D5 NS at 75 mL/h Consultants: General Surgery, OB Family Communication: None at bedside  Status: Inpatient Level of care:  Med-Surg   Patient is from: Home Needs to  continue in-hospital care: Pending MRCP, may need surgery Anticipated d/c to: Ultimately home     Diet:  Diet Order             Diet NPO time specified Except for: Sips with Meds  Diet effective midnight            Diet clear liquid Fluid consistency: Thin  Diet effective now                   Scheduled Meds:   PRN meds: acetaminophen , hydrALAZINE  **AND** hydrALAZINE  **AND** labetalol  **AND** labetalol  **AND** Measure blood pressure, morphine  injection, ondansetron  (ZOFRAN ) IV, oxyCODONE    Infusions:   cefTRIAXone  (ROCEPHIN )  IV 2 g (04/05/24 2332)   dextrose  5 % and 0.45 % NaCl 75 mL/hr at 04/06/24 0101   potassium chloride  10 mEq (04/06/24 0949)    Antimicrobials: Anti-infectives (From admission, onward)    Start     Dose/Rate Route Frequency Ordered Stop   04/05/24 2300  cefTRIAXone  (ROCEPHIN ) 2 g in sodium chloride  0.9 % 100 mL IVPB        2 g 200 mL/hr over 30 Minutes Intravenous Every 24 hours 04/05/24 2214         Objective: Vitals:   04/06/24 0416 04/06/24 1013  BP: (!) 143/75 139/71  Pulse: 73 66  Resp:  18  Temp: 97.9 F (36.6 C) 98.1 F (36.7 C)  SpO2: 100% 98%    Intake/Output Summary (Last 24 hours) at 04/06/2024 1052 Last data filed at 04/06/2024 1039 Gross per 24 hour  Intake 576.7 ml  Output --  Net 576.7 ml   Filed Weights   04/05/24 1617  Weight: 119.3 kg   Weight change:  Body mass index is 45.14 kg/m.   Physical Exam: General exam: Pleasant, young African-American female Skin: No rashes, lesions or ulcers. HEENT: Atraumatic, normocephalic, no obvious bleeding Lungs: Clear to auscultation bilaterally,  CVS: S1, S2, no murmur,   GI/Abd: Soft, mild epigastric and right upper quadrant tenderness present, nondistended, bowel sound present,   CNS: Alert, awake, oriented x 3 Psychiatry: Mood appropriate Extremities: No pedal edema, no calf tenderness,   Data Review: I have personally reviewed the laboratory data and studies available.  F/u labs ordered Unresulted Labs (From admission, onward)     Start     Ordered   04/07/24 0500  CBC  Tomorrow morning,   R        04/06/24 0906   04/07/24 0500  Comprehensive metabolic panel with  GFR  Tomorrow morning,   R        04/06/24 0906   04/05/24 1750  Culture, OB Urine  Add-on,   AD        04/05/24 1749            Signed, Chapman Rota, MD [facility] 04/06/2024

## 2024-04-07 ENCOUNTER — Other Ambulatory Visit: Payer: Self-pay

## 2024-04-07 ENCOUNTER — Inpatient Hospital Stay (HOSPITAL_COMMUNITY): Admitting: Certified Registered"

## 2024-04-07 ENCOUNTER — Encounter (HOSPITAL_COMMUNITY): Payer: Self-pay | Admitting: Internal Medicine

## 2024-04-07 ENCOUNTER — Encounter (HOSPITAL_COMMUNITY)
Admission: AD | Disposition: A | Discharge status: Home / Self Care | Payer: Self-pay | Source: Home / Self Care | Attending: Obstetrics and Gynecology

## 2024-04-07 DIAGNOSIS — I10 Essential (primary) hypertension: Secondary | ICD-10-CM | POA: Diagnosis not present

## 2024-04-07 DIAGNOSIS — F419 Anxiety disorder, unspecified: Secondary | ICD-10-CM

## 2024-04-07 DIAGNOSIS — K8001 Calculus of gallbladder with acute cholecystitis with obstruction: Secondary | ICD-10-CM | POA: Diagnosis not present

## 2024-04-07 DIAGNOSIS — K8000 Calculus of gallbladder with acute cholecystitis without obstruction: Secondary | ICD-10-CM | POA: Diagnosis not present

## 2024-04-07 DIAGNOSIS — K8010 Calculus of gallbladder with chronic cholecystitis without obstruction: Secondary | ICD-10-CM | POA: Diagnosis not present

## 2024-04-07 DIAGNOSIS — K819 Cholecystitis, unspecified: Secondary | ICD-10-CM

## 2024-04-07 DIAGNOSIS — Z87891 Personal history of nicotine dependence: Secondary | ICD-10-CM | POA: Diagnosis not present

## 2024-04-07 HISTORY — PX: CHOLECYSTECTOMY: SHX55

## 2024-04-07 HISTORY — PX: INDOCYANINE GREEN FLUORESCENCE IMAGING (ICG): SHX7595

## 2024-04-07 LAB — COMPREHENSIVE METABOLIC PANEL WITH GFR
ALT: 152 U/L — ABNORMAL HIGH (ref 0–44)
AST: 135 U/L — ABNORMAL HIGH (ref 15–41)
Albumin: 3 g/dL — ABNORMAL LOW (ref 3.5–5.0)
Alkaline Phosphatase: 262 U/L — ABNORMAL HIGH (ref 38–126)
Anion gap: 9 (ref 5–15)
BUN: 5 mg/dL — ABNORMAL LOW (ref 6–20)
CO2: 24 mmol/L (ref 22–32)
Calcium: 8.6 mg/dL — ABNORMAL LOW (ref 8.9–10.3)
Chloride: 105 mmol/L (ref 98–111)
Creatinine, Ser: 0.88 mg/dL (ref 0.44–1.00)
GFR, Estimated: >60 mL/min (ref >60)
Glucose, Bld: 86 mg/dL (ref 70–99)
Potassium: 3.5 mmol/L (ref 3.5–5.1)
Sodium: 138 mmol/L (ref 135–145)
Total Bilirubin: 1.1 mg/dL (ref 0.0–1.2)
Total Protein: 5.7 g/dL — ABNORMAL LOW (ref 6.5–8.1)

## 2024-04-07 LAB — CBC
HCT: 30.8 % — ABNORMAL LOW (ref 36.0–46.0)
Hemoglobin: 10.4 g/dL — ABNORMAL LOW (ref 12.0–15.0)
MCH: 29.9 pg (ref 26.0–34.0)
MCHC: 33.8 g/dL (ref 30.0–36.0)
MCV: 88.5 fL (ref 80.0–100.0)
Platelets: 158 K/uL (ref 150–400)
RBC: 3.48 MIL/uL — ABNORMAL LOW (ref 3.87–5.11)
RDW: 13.7 % (ref 11.5–15.5)
WBC: 5.7 K/uL (ref 4.0–10.5)
nRBC: 0 % (ref 0.0–0.2)

## 2024-04-07 LAB — CULTURE, OB URINE: Culture: >=100000 — AB

## 2024-04-07 SURGERY — LAPAROSCOPIC CHOLECYSTECTOMY
Anesthesia: General | Site: Abdomen

## 2024-04-07 MED ORDER — BUPIVACAINE-EPINEPHRINE (PF) 0.25% -1:200000 IJ SOLN
INTRAMUSCULAR | Status: DC | PRN
Start: 2024-04-07 — End: 2024-04-07
  Administered 2024-04-07: 30 mL

## 2024-04-07 MED ORDER — CHLORHEXIDINE GLUCONATE 0.12 % MT SOLN
15.0000 mL | Freq: Once | OROMUCOSAL | Status: AC
  Administered 2024-04-07: 15 mL via OROMUCOSAL

## 2024-04-07 MED ORDER — OXYCODONE HCL 5 MG PO TABS: 5.0000 mg | ORAL_TABLET | Freq: Once | ORAL | Status: AC | PRN

## 2024-04-07 MED ORDER — LIDOCAINE 2% (20 MG/ML) 5 ML SYRINGE
INTRAMUSCULAR | Status: AC
  Filled 2024-04-07: qty 5

## 2024-04-07 MED ORDER — ONDANSETRON HCL 4 MG/2ML IJ SOLN
INTRAMUSCULAR | Status: AC
  Filled 2024-04-07: qty 2

## 2024-04-07 MED ORDER — PROPOFOL 10 MG/ML IV BOLUS
INTRAVENOUS | Status: DC | PRN
  Administered 2024-04-07: 200 mg via INTRAVENOUS

## 2024-04-07 MED ORDER — FENTANYL CITRATE (PF) 250 MCG/5ML IJ SOLN
INTRAMUSCULAR | Status: DC | PRN
  Administered 2024-04-07: 100 ug via INTRAVENOUS
  Administered 2024-04-07: 50 ug via INTRAVENOUS

## 2024-04-07 MED ORDER — MIDAZOLAM HCL 2 MG/2ML IJ SOLN
INTRAMUSCULAR | Status: AC
  Filled 2024-04-07: qty 2

## 2024-04-07 MED ORDER — CHLORHEXIDINE GLUCONATE CLOTH 2 % EX PADS: 6.0000 | MEDICATED_PAD | Freq: Once | CUTANEOUS | Status: DC

## 2024-04-07 MED ORDER — ORAL CARE MOUTH RINSE: 15.0000 mL | Freq: Once | OROMUCOSAL | Status: AC

## 2024-04-07 MED ORDER — LIDOCAINE 2% (20 MG/ML) 5 ML SYRINGE
INTRAMUSCULAR | Status: DC | PRN
  Administered 2024-04-07: 60 mg via INTRAVENOUS

## 2024-04-07 MED ORDER — GABAPENTIN 300 MG PO CAPS: 300.0000 mg | ORAL_CAPSULE | ORAL | Status: DC

## 2024-04-07 MED ORDER — ONDANSETRON HCL 4 MG/2ML IJ SOLN
INTRAMUSCULAR | Status: DC | PRN
  Administered 2024-04-07: 4 mg via INTRAVENOUS

## 2024-04-07 MED ORDER — OXYCODONE HCL 5 MG/5ML PO SOLN
5.0000 mg | Freq: Once | ORAL | Status: AC | PRN
  Administered 2024-04-07: 5 mg via ORAL

## 2024-04-07 MED ORDER — SUGAMMADEX SODIUM 200 MG/2ML IV SOLN
INTRAVENOUS | Status: DC | PRN
  Administered 2024-04-07: 200 mg via INTRAVENOUS

## 2024-04-07 MED ORDER — PROPOFOL 10 MG/ML IV BOLUS
INTRAVENOUS | Status: AC
Start: 2024-04-07 — End: 2024-04-07
  Filled 2024-04-07: qty 20

## 2024-04-07 MED ORDER — AMISULPRIDE (ANTIEMETIC) 5 MG/2ML IV SOLN: 10.0000 mg | Freq: Once | INTRAVENOUS | Status: DC | PRN

## 2024-04-07 MED ORDER — HYDROMORPHONE HCL 1 MG/ML IJ SOLN
0.2500 mg | INTRAMUSCULAR | Status: DC | PRN
  Administered 2024-04-07: 0.25 mg via INTRAVENOUS
  Administered 2024-04-07: 0.5 mg via INTRAVENOUS
  Administered 2024-04-07: 0.25 mg via INTRAVENOUS

## 2024-04-07 MED ORDER — ACETAMINOPHEN 500 MG PO TABS: 1000.0000 mg | ORAL_TABLET | ORAL | Status: DC

## 2024-04-07 MED ORDER — CELECOXIB 200 MG PO CAPS: 200.0000 mg | ORAL_CAPSULE | ORAL | Status: DC

## 2024-04-07 MED ORDER — FENTANYL CITRATE (PF) 250 MCG/5ML IJ SOLN
INTRAMUSCULAR | Status: AC
  Filled 2024-04-07: qty 5

## 2024-04-07 MED ORDER — SODIUM CHLORIDE 0.9 % IV SOLN
2.0000 g | INTRAVENOUS | Status: AC
  Administered 2024-04-07: 2 g via INTRAVENOUS
  Filled 2024-04-07: qty 20

## 2024-04-07 MED ORDER — ROCURONIUM BROMIDE 10 MG/ML (PF) SYRINGE
PREFILLED_SYRINGE | INTRAVENOUS | Status: DC | PRN
  Administered 2024-04-07: 20 mg via INTRAVENOUS
  Administered 2024-04-07: 50 mg via INTRAVENOUS

## 2024-04-07 MED ORDER — HYDROMORPHONE HCL 1 MG/ML IJ SOLN
INTRAMUSCULAR | Status: AC
  Filled 2024-04-07: qty 1

## 2024-04-07 MED ORDER — OXYCODONE HCL 5 MG/5ML PO SOLN
ORAL | Status: AC
  Filled 2024-04-07: qty 5

## 2024-04-07 MED ORDER — INDOCYANINE GREEN 25 MG IJ SOLR
2.5000 mg | Freq: Once | INTRAMUSCULAR | Status: DC
  Filled 2024-04-07: qty 10

## 2024-04-07 MED ORDER — BUPIVACAINE-EPINEPHRINE (PF) 0.25% -1:200000 IJ SOLN
INTRAMUSCULAR | Status: AC
  Filled 2024-04-07: qty 30

## 2024-04-07 MED ORDER — CEFAZOLIN SODIUM-DEXTROSE 2-3 GM-%(50ML) IV SOLR
INTRAVENOUS | Status: DC | PRN
  Administered 2024-04-07: 2 g via INTRAVENOUS

## 2024-04-07 MED ORDER — INDOCYANINE GREEN 25 MG IJ SOLR
1.2500 mg | Freq: Once | INTRAMUSCULAR | Status: AC
  Administered 2024-04-07: 1.25 mg via INTRAVENOUS
  Filled 2024-04-07: qty 10

## 2024-04-07 MED ORDER — SUCCINYLCHOLINE CHLORIDE 200 MG/10ML IV SOSY
PREFILLED_SYRINGE | INTRAVENOUS | Status: DC | PRN
  Administered 2024-04-07: 140 mg via INTRAVENOUS

## 2024-04-07 MED ORDER — LACTATED RINGERS IV SOLN: INTRAVENOUS | Status: DC

## 2024-04-07 MED ORDER — 0.9 % SODIUM CHLORIDE (POUR BTL) OPTIME
TOPICAL | Status: DC | PRN
Start: 2024-04-07 — End: 2024-04-07
  Administered 2024-04-07: 1000 mL

## 2024-04-07 MED ORDER — MEPERIDINE HCL 25 MG/ML IJ SOLN: 6.2500 mg | INTRAMUSCULAR | Status: DC | PRN

## 2024-04-07 MED ORDER — MIDAZOLAM HCL (PF) 2 MG/2ML IJ SOLN
INTRAMUSCULAR | Status: DC | PRN
  Administered 2024-04-07: 2 mg via INTRAVENOUS

## 2024-04-07 MED ORDER — SODIUM CHLORIDE 0.9 % IR SOLN
Status: DC | PRN
  Administered 2024-04-07: 1000 mL

## 2024-04-07 SURGICAL SUPPLY — 35 items
BAG COUNTER SPONGE SURGICOUNT (BAG) ×2 IMPLANT
CANISTER SUCTION 3000ML PPV (SUCTIONS) ×2 IMPLANT
CHLORAPREP W/TINT 26 (MISCELLANEOUS) ×2 IMPLANT
CLIP APPLIE ROT 10 11.4 M/L (STAPLE) ×2 IMPLANT
COVER SURGICAL LIGHT HANDLE (MISCELLANEOUS) ×2 IMPLANT
DERMABOND ADVANCED .7 DNX12 (GAUZE/BANDAGES/DRESSINGS) ×2 IMPLANT
ELECTRODE REM PT RTRN 9FT ADLT (ELECTROSURGICAL) ×2 IMPLANT
ENDOLOOP SUT PDS II 0 18 (SUTURE) IMPLANT
GLOVE BIO SURGEON STRL SZ7 (GLOVE) ×2 IMPLANT
GOWN STRL REUS W/ TWL LRG LVL3 (GOWN DISPOSABLE) ×4 IMPLANT
GOWN STRL REUS W/ TWL XL LVL3 (GOWN DISPOSABLE) ×2 IMPLANT
GRASPER SUT TROCAR 14GX15 (MISCELLANEOUS) ×2 IMPLANT
IRRIGATION SUCT STRKRFLW 2 WTP (MISCELLANEOUS) ×2 IMPLANT
KIT BASIN OR (CUSTOM PROCEDURE TRAY) ×2 IMPLANT
KIT IMAGING PINPOINTPAQ (MISCELLANEOUS) IMPLANT
KIT TURNOVER KIT B (KITS) ×2 IMPLANT
LHOOK LAP DISP 36CM (ELECTROSURGICAL) ×2 IMPLANT
NDL 22X1.5 STRL (OR ONLY) (MISCELLANEOUS) ×2 IMPLANT
NDL INSUFFLATION 14GA 120MM (NEEDLE) ×2 IMPLANT
PAD ARMBOARD POSITIONER FOAM (MISCELLANEOUS) ×2 IMPLANT
PENCIL BUTTON HOLSTER BLD 10FT (ELECTRODE) ×2 IMPLANT
POUCH RETRIEVAL ECOSAC 10 (ENDOMECHANICALS) ×2 IMPLANT
SCISSORS LAP 5X35 DISP (ENDOMECHANICALS) ×2 IMPLANT
SET TUBE SMOKE EVAC HIGH FLOW (TUBING) ×2 IMPLANT
SLEEVE Z-THREAD 5X100MM (TROCAR) ×4 IMPLANT
SOLN 0.9% NACL POUR BTL 1000ML (IV SOLUTION) ×2 IMPLANT
SOLN STERILE WATER BTL 1000 ML (IV SOLUTION) ×2 IMPLANT
SUT MNCRL AB 4-0 PS2 18 (SUTURE) ×2 IMPLANT
SUT VICRYL 0 UR6 27IN ABS (SUTURE) IMPLANT
TOWEL GREEN STERILE (TOWEL DISPOSABLE) ×2 IMPLANT
TOWEL GREEN STERILE FF (TOWEL DISPOSABLE) ×2 IMPLANT
TRAY LAPAROSCOPIC MC (CUSTOM PROCEDURE TRAY) ×2 IMPLANT
TROCAR Z THREAD OPTICAL 12X100 (TROCAR) ×2 IMPLANT
TROCAR Z-THREAD OPTICAL 5X100M (TROCAR) ×2 IMPLANT
WARMER LAPAROSCOPE (MISCELLANEOUS) ×2 IMPLANT

## 2024-04-07 NOTE — Transfer of Care (Signed)
 Immediate Anesthesia Transfer of Care Note  Patient: Dominique Garza  Procedure(s) Performed: LAPAROSCOPIC CHOLECYSTECTOMY (Abdomen) INDOCYANINE GREEN FLUORESCENCE IMAGING (ICG) (Abdomen)  Patient Location: PACU  Anesthesia Type:General  Level of Consciousness: awake, alert , oriented, and patient cooperative  Airway & Oxygen Therapy: Patient Spontanous Breathing  Post-op Assessment: Report given to RN and Post -op Vital signs reviewed and stable  Post vital signs: Reviewed and stable  Last Vitals:  Vitals Value Taken Time  BP 147/80 04/07/24 12:15  Temp 37.3 C 04/07/24 12:15  Pulse 84 04/07/24 12:17  Resp 27 04/07/24 12:17  SpO2 94 % 04/07/24 12:17  Vitals shown include unfiled device data.  Last Pain:  Vitals:   04/07/24 0918  TempSrc:   PainSc: 0-No pain         Complications: No notable events documented.

## 2024-04-07 NOTE — Anesthesia Procedure Notes (Signed)
 Procedure Name: Intubation Date/Time: 04/07/2024 10:47 AM  Performed by: Marva Lonni PARAS, CRNAPre-anesthesia Checklist: Patient identified, Emergency Drugs available, Suction available and Patient being monitored Patient Re-evaluated:Patient Re-evaluated prior to induction Oxygen Delivery Method: Circle System Utilized Preoxygenation: Pre-oxygenation with 100% oxygen Induction Type: IV induction, Rapid sequence and Cricoid Pressure applied Laryngoscope Size: Mac and 3 Grade View: Grade I Tube type: Oral Tube size: 7.0 mm Number of attempts: 1 Airway Equipment and Method: Stylet Placement Confirmation: ETT inserted through vocal cords under direct vision, positive ETCO2 and breath sounds checked- equal and bilateral Secured at: 21 cm Tube secured with: Tape Dental Injury: Teeth and Oropharynx as per pre-operative assessment

## 2024-04-07 NOTE — Progress Notes (Signed)
 * Day of Surgery *  Subjective: No new complaints.   ROS: See above, otherwise other systems negative  Objective: Vital signs in last 24 hours: Temp:  [98.1 F (36.7 C)-98.6 F (37 C)] (P) 98.6 F (37 C) (11/28 0911) Pulse Rate:  [58-77] (P) 71 (11/28 0911) Resp:  [18] (P) 18 (11/28 0911) BP: (139-159)/(67-74) (P) 156/76 (11/28 0911) SpO2:  [95 %-100 %] (P) 98 % (11/28 0911) Weight:  [119.3 kg] (P) 119.3 kg (11/28 0911) Last BM Date : 04/05/24  Intake/Output from previous day: 11/27 0701 - 11/28 0700 In: 600 [P.O.:600] Out: -  Intake/Output this shift: No intake/output data recorded.  PE: Gen: NAD Lungs: respiratory effort nonlabored Abd: soft, tender in epigastrium and some in central abdomen, post partum distention as expected  Lab Results:  Recent Labs    04/06/24 0424 04/07/24 0537  WBC 5.4 5.7  HGB 10.2* 10.4*  HCT 30.0* 30.8*  PLT 136* 158   BMET Recent Labs    04/06/24 0424 04/07/24 0537  NA 140 138  K 3.1* 3.5  CL 105 105  CO2 24 24  GLUCOSE 82 86  BUN 7 5*  CREATININE 0.95 0.88  CALCIUM 8.2* 8.6*   PT/INR No results for input(s): LABPROT, INR in the last 72 hours. CMP     Component Value Date/Time   NA 138 04/07/2024 0537   NA 135 08/13/2023 1401   K 3.5 04/07/2024 0537   CL 105 04/07/2024 0537   CO2 24 04/07/2024 0537   GLUCOSE 86 04/07/2024 0537   BUN 5 (L) 04/07/2024 0537   BUN 8 08/13/2023 1401   CREATININE 0.88 04/07/2024 0537   CALCIUM 8.6 (L) 04/07/2024 0537   PROT 5.7 (L) 04/07/2024 0537   PROT 6.8 08/13/2023 1401   ALBUMIN 3.0 (L) 04/07/2024 0537   ALBUMIN 4.1 08/13/2023 1401   AST 135 (H) 04/07/2024 0537   ALT 152 (H) 04/07/2024 0537   ALKPHOS 262 (H) 04/07/2024 0537   BILITOT 1.1 04/07/2024 0537   BILITOT 0.3 08/13/2023 1401   GFRNONAA >60 04/07/2024 0537   GFRAA >60 11/23/2017 0243   Lipase     Component Value Date/Time   LIPASE 26 04/05/2024 1709       Studies/Results: MR ABDOMEN MRCP W WO  CONTAST Result Date: 04/06/2024 CLINICAL DATA:  Cholelithiasis EXAM: MRI ABDOMEN WITHOUT AND WITH CONTRAST (INCLUDING MRCP) TECHNIQUE: Multiplanar multisequence MR imaging of the abdomen was performed both before and after the administration of intravenous contrast. Heavily T2-weighted images of the biliary and pancreatic ducts were obtained. Post-processing was applied at the acquisition scanner with concurrent physician supervision which includes 3D reconstructions, MIPs, volume rendered images and/or shaded surface rendering. CONTRAST:  10mL GADAVIST  GADOBUTROL  1 MMOL/ML IV SOLN COMPARISON:  Ultrasound abdomen dated 04/05/2024 CT abdomen and pelvis dated 02/07/2023 FINDINGS: Lower chest: Small right pleural effusion. Hepatobiliary: Ovoid, well-circumscribed T2 hyperintense focus within the caudate lobe measuring 2.5 x 1.9 cm (5:17) demonstrates circumferential arterial enhancement with central fill-in. Heterogeneous enhancement of the inferior right hepatic lobe, likely secondary to volume averaging artifact from the adjacent colon. No bile duct dilation or intraluminal filling defects. Nondistended gallbladder with cholelithiasis and marked circumferential gallbladder edema. Pancreas: No mass, inflammatory changes, or other parenchymal abnormality identified. Spleen:  Within normal limits in size and appearance. Adrenals/Urinary Tract: No adrenal nodules. No suspicious renal masses identified. No evidence of hydronephrosis. Stomach/Bowel: Visualized portions within the abdomen are unremarkable. Vascular/Lymphatic: No pathologically enlarged lymph nodes identified. No abdominal aortic  aneurysm demonstrated. Other: Partially imaged enlarged, postpartum uterus. Trace pelvic free fluid. Musculoskeletal: No suspicious bone lesions identified. Partially imaged bilateral breast with multifocal cystic foci, likely related to lactation. Mild body wall edema. IMPRESSION: 1. Nondistended gallbladder with cholelithiasis  and marked circumferential gallbladder edema, which may represent acute cholecystitis, or may be reactive if there is concurrent hepatitis. No bile duct dilation or intraluminal filling defects. 2. Ovoid, well-circumscribed T2 hyperintense focus within the caudate lobe measuring 2.5 x 1.9 cm demonstrates circumferential arterial enhancement with central fill-in, possibly a hemangioma, although the continuous early arterial enhancement pattern is somewhat unusual. Recommend follow-up contrast-enhanced MRI or liver protocol CT abdomen in 3-6 months to ensure stability. 3. Small right pleural effusion. Electronically Signed   By: Limin  Xu M.D.   On: 04/06/2024 17:01   MR 3D Recon At Scanner Result Date: 04/06/2024 CLINICAL DATA:  Cholelithiasis EXAM: MRI ABDOMEN WITHOUT AND WITH CONTRAST (INCLUDING MRCP) TECHNIQUE: Multiplanar multisequence MR imaging of the abdomen was performed both before and after the administration of intravenous contrast. Heavily T2-weighted images of the biliary and pancreatic ducts were obtained. Post-processing was applied at the acquisition scanner with concurrent physician supervision which includes 3D reconstructions, MIPs, volume rendered images and/or shaded surface rendering. CONTRAST:  10mL GADAVIST  GADOBUTROL  1 MMOL/ML IV SOLN COMPARISON:  Ultrasound abdomen dated 04/05/2024 CT abdomen and pelvis dated 02/07/2023 FINDINGS: Lower chest: Small right pleural effusion. Hepatobiliary: Ovoid, well-circumscribed T2 hyperintense focus within the caudate lobe measuring 2.5 x 1.9 cm (5:17) demonstrates circumferential arterial enhancement with central fill-in. Heterogeneous enhancement of the inferior right hepatic lobe, likely secondary to volume averaging artifact from the adjacent colon. No bile duct dilation or intraluminal filling defects. Nondistended gallbladder with cholelithiasis and marked circumferential gallbladder edema. Pancreas: No mass, inflammatory changes, or other  parenchymal abnormality identified. Spleen:  Within normal limits in size and appearance. Adrenals/Urinary Tract: No adrenal nodules. No suspicious renal masses identified. No evidence of hydronephrosis. Stomach/Bowel: Visualized portions within the abdomen are unremarkable. Vascular/Lymphatic: No pathologically enlarged lymph nodes identified. No abdominal aortic aneurysm demonstrated. Other: Partially imaged enlarged, postpartum uterus. Trace pelvic free fluid. Musculoskeletal: No suspicious bone lesions identified. Partially imaged bilateral breast with multifocal cystic foci, likely related to lactation. Mild body wall edema. IMPRESSION: 1. Nondistended gallbladder with cholelithiasis and marked circumferential gallbladder edema, which may represent acute cholecystitis, or may be reactive if there is concurrent hepatitis. No bile duct dilation or intraluminal filling defects. 2. Ovoid, well-circumscribed T2 hyperintense focus within the caudate lobe measuring 2.5 x 1.9 cm demonstrates circumferential arterial enhancement with central fill-in, possibly a hemangioma, although the continuous early arterial enhancement pattern is somewhat unusual. Recommend follow-up contrast-enhanced MRI or liver protocol CT abdomen in 3-6 months to ensure stability. 3. Small right pleural effusion. Electronically Signed   By: Limin  Xu M.D.   On: 04/06/2024 17:01   DG Chest Portable 1 View Result Date: 04/05/2024 EXAM: 1 VIEW(S) XRAY OF THE CHEST 04/05/2024 08:31:00 PM COMPARISON: 11/23/2017 CLINICAL HISTORY: SOB, fluid around lungs on RUQ US  FINDINGS: LUNGS AND PLEURA: Hazy lower lung opacities. Small right pleural effusion. Mild pulmonary edema. No pneumothorax. HEART AND MEDIASTINUM: Cardiomegaly. BONES AND SOFT TISSUES: No acute osseous abnormality. IMPRESSION: 1. Hazy lower lung opacities, small right pleural effusion, and mild pulmonary edema. 2. Cardiomegaly. Electronically signed by: Oneil Devonshire MD 04/05/2024 08:34 PM  EST RP Workstation: [id number]   US  ABDOMEN LIMITED RUQ (LIVER/GB) Result Date: 04/05/2024 EXAM: Right Upper Quadrant Abdominal Ultrasound 04/05/2024 07:53:57 PM TECHNIQUE: Real-time ultrasonography of  the right upper quadrant of the abdomen was performed. COMPARISON: CT abdomen and pelvis 02/07/2023. CLINICAL HISTORY: RUQ pain; Total bilirubin, elevated. 2 weeks postpartum. FINDINGS: LIVER: The liver demonstrates normal echogenicity. Mild intrahepatic bile duct dilatation. No evidence of mass. BILIARY SYSTEM: Cholelithiasis with small stones in the gallbladder. Gallbladder wall thickening with mild pericholecystic edema. The gallbladder wall measures 6.5 mm in thickness. Increased vascularity suggested on color flow doppler imaging. Murphy sign is positive. Bile ducts are mildly dilated, with the common bile duct diameter measured at 7.6 mm. The cause is not identified. RIGHT KIDNEY: The right kidney is grossly unremarkable in appearances without evidence of hydronephrosis, echogenic calculi or worrisome mass lesions. PANCREAS: Visualized portions of the pancreas are unremarkable. OTHER: No right upper quadrant ascites. Incidental finding of small right pleural effusion. IMPRESSION: 1. Cholelithiasis with gallbladder wall thickening, mild pericholecystic edema, hyperemia, and positive sonographic Murphy sign, consistent with acute cholecystitis. 2. Mildly dilated intrahepatic and extrahepatic bile ducts with a 7.6 mm common bile duct, etiology not identified. 3. Small right pleural effusion. Electronically signed by: Elsie Gravely MD 04/05/2024 07:59 PM EST RP Workstation: [id number]    Anti-infectives: Anti-infectives (From admission, onward)    Start     Dose/Rate Route Frequency Ordered Stop   04/07/24 1000  cefTRIAXone  (ROCEPHIN ) 2 g in sodium chloride  0.9 % 100 mL IVPB        2 g 200 mL/hr over 30 Minutes Intravenous On call to O.R. 04/07/24 0959 04/08/24 0559   04/05/24 2300  [MAR Hold]   cefTRIAXone  (ROCEPHIN ) 2 g in sodium chloride  0.9 % 100 mL IVPB        (MAR Hold since Fri 04/07/2024 at 0905.Hold Reason: Transfer to a Procedural area)   2 g 200 mL/hr over 30 Minutes Intravenous Every 24 hours 04/05/24 2214          Assessment/Plan Cholelithiasis with elevated LFTs and negative MRCP  - Will proceed to the OR this morning. We discussed the alternatives and potential risks of surgery, including but not limited to: bleeding, infection, damage to bowel or surrounding structures, bile leak, pancreatitis, retained stone, damage to the biliary system, and need for additional procedures. All questions were addressed and consent was obtained.     FEN - NPO VTE - ok for chemical prophylaxis from our standpoint ID - rocephin   2 weeks post partum  I reviewed hospitalist notes, last 24 h vitals and pain scores, last 48 h intake and output, last 24 h labs and trends, and last 24 h imaging results.   LOS: 2 days    Cordella DELENA Idler , MD [facility] Surgery 04/07/2024, 9:59 AM Please see Amion for pager number during day hours 7:00am-4:30pm or 7:00am -11:30am on weekends

## 2024-04-07 NOTE — Anesthesia Preprocedure Evaluation (Addendum)
 Anesthesia Evaluation  Patient identified by MRN, date of birth, ID band Patient awake    Reviewed: Allergy & Precautions, H&P , NPO status , Patient's Chart, lab work & pertinent test results  Airway Mallampati: II  TM Distance: >3 FB Neck ROM: Full    Dental  (+) Dental Advisory Given   Pulmonary neg pulmonary ROS, former smoker   Pulmonary exam normal breath sounds clear to auscultation       Cardiovascular hypertension, Pt. on medications negative cardio ROS Normal cardiovascular exam Rhythm:Regular Rate:Normal     Neuro/Psych   Anxiety     negative neurological ROS  negative psych ROS   GI/Hepatic negative GI ROS, Neg liver ROS,,,  Endo/Other    Class 3 obesity  Renal/GU negative Renal ROS  negative genitourinary   Musculoskeletal  (+)  Fibromyalgia -  Abdominal   Peds negative pediatric ROS (+)  Hematology  (+) Blood dyscrasia, anemia   Anesthesia Other Findings   Reproductive/Obstetrics negative OB ROS                              Anesthesia Physical Anesthesia Plan  ASA: 3  Anesthesia Plan: General   Post-op Pain Management:    Induction: Intravenous, Rapid sequence and Cricoid pressure planned  PONV Risk Score and Plan: 3 and Ondansetron , Dexamethasone, Midazolam and Treatment may vary due to age or medical condition  Airway Management Planned: Oral ETT  Additional Equipment:   Intra-op Plan:   Post-operative Plan: Extubation in OR  Informed Consent: I have reviewed the patients History and Physical, chart, labs and discussed the procedure including the risks, benefits and alternatives for the proposed anesthesia with the patient or authorized representative who has indicated his/her understanding and acceptance.     Dental advisory given  Plan Discussed with: CRNA  Anesthesia Plan Comments:         Anesthesia Quick Evaluation

## 2024-04-07 NOTE — Op Note (Signed)
 Patient: Dominique Garza MRN: [id number] DOB: [birth year] Sex: female Operation/Procedure Date: 04/05/2024 - 04/07/2024 Surgeons and Role:    * Polly Dominique LABOR, MD - Primary Pre-operative Diagnoses: Cholecystitis Postoperative Diagnoses: Cholecystitis  Procedure performed: Procedures:   * LAPAROSCOPIC CHOLECYSTECTOMY   * INDOCYANINE GREEN FLUORESCENCE IMAGING (ICG)  Anesthesia: General endotracheal anesthesia  Indications: Dominique Garza is a 38 year old female who presented to the ED with abdominal pain.  Ultrasound showed gallstones, and based on history and physical exam she was diagnosed with acute cholecystitis. Her LFTs were initially elevated, but these were trending down on the day of surgery. Preoperatively, I discussed in detail the risks, benefits, alternatives, and potential complications. The patient understands and requests to proceed.  Operative Findings: Significant inflammation consistent with acute cholecystitis.  Operative Narrative: The patient was positively identified and was taken to the operating room and placed supine on the operating table. A time-out was performed confirming correct patient and procedure. We also confirmed initiation of deep venous thrombosis prophylaxis and wound prophylaxis. After successful induction of general endotracheal anesthesia, the arms were carefully padded. An orogastric tube and footboard were placed. The abdomen was prepped and draped in the usual sterile surgical fashion.  We began our peritoneal access with a veress needle inserted at Palmer's point.  After aspiration showed return of air bubbles and there was a positive saline drop test, the insufflation was connected and the abdomen brought to a pressure of .  We then used an opti-view technique to place a 5mm port just to the right of midline, superior to the umbilicus.  A laparoscope was introduced into the abdomen. There was evidence of a small nick on the left side of the  liver with a small associated hematoma related to the veress entry. There was no signs of active bleeding.  A 12mm port was placed in the subxiphoid position.  Two additional 5mm ports were placed in the RUQ.  A 360-degree visualization with a 30-degree 5-mm laparoscope revealed grossly normal intra-abdominal contents.   The patient was placed in the head up position and tilted slightly to the left. The dome of the gallbladder was then grasped, elevated, and retracted anteriorly and cephalad.  The infundibulum was retracted laterally and inferiorly exposing Calot's triangle. The investing visceral peritoneal attachments overlying the infundibulum of the gallbladder were incised using the hook electrocautery and dissected free from the gallbladder itself. We soon developed two structures into the gallbladder consistent with the cystic duct and cystic artery. The loose areolar tissue around these structures was dissected free. The gallbladder was separated from the gallbladder fossa for approximately a third the distance up from the cystic plate, establishing the critical view of safety. We then transitioned to infrared viewing mode to allow for visualization of the ICG tracer. There was tracer throughout the liver. There was tracer within the candidate cystic duct as well as in a separate structure medially to the duct, consistent with where the common bile duct would be expected.  There was no tracer within the candidate cystic artery.  There was tracer within the duodenum, indicating no presence of a biliary obstruction. We transitioned back to normal viewing mode. The cystic duct and cystic artery were triply clipped. These were divided using laparoscopic scissors leaving a single clip on the removal side. The gallbladder was elevated off the gallbladder fossa using the hook Bovie. The gallbladder was then exteriorized through the subxiphoid port site using an EndoCatch bag. We reestablished pneumoperitoneum and  confirmed no leakage of blood or bile. We confirmed integrity of our clips. The subhepatic space was irrigated with warm sterile saline and suctioned free. The left lobe of the liver was examined again and appeared hemostatic with the small hematoma unchanged. The 12mm port site was closed using an 0-vicryl on a suture passer.We placed 0.25% Marcaine  with epinephrine  at each incision site for local anesthesia. The skin was closed using 4-0 Monocryl subcuticular suture. Dermabond was applied. The patient tolerated the procedure well, was extubated, and taken to the recovery room.  Estimated Blood Loss: Minimal Specimens: Gallbladder Implants: None Drains: None Complications: None Condition of the patient: Good, extubated Disposition: PACU  Dominique Garza Idler Date: 04/07/2024 Time: 11:57 AM

## 2024-04-07 NOTE — Progress Notes (Signed)
 PROGRESS NOTE  Dominique Garza  DOB: 07/01/1985  PCP: [name], FNP FMW:[id number]  DOA: 04/05/2024  LOS: 2 days  Hospital Day: 3  Subjective: Patient was seen and examined this morning prior to surgery. Afebrile, hemodynamically stable with blood pressure in 140s and 150s Labs from this morning with improving total bili but AST, ALT, alk phos  WBC count normal, hemoglobin 10.4  Brief narrative: Dominique Garza is a 38 y.o. female with PMH significant for HSV-2, fibromyalgia, pregnancy-induced hypertension, anxiety who is 2 weeks postpartum following an uncomplicated pregnancy and vaginal delivery. 11/26, patient presented to the [facility] with complaint of 2 days of RUQ pain, nausea, vomiting.  Initially, patient was afebrile heart rate in 60s, blood pressure elevated to 180s, breathing on room air Initial labs with WBC count 7.7, hemoglobin 11.1, renal function normal, lipase normal at 26, AST, ALT, alk phos, total bili elevated Urinalysis with hazy yellow urine with large hemoglobin, small leukocytes, many bacteria Chest x-ray showed hazy lower lung opacities, small right pleural effusion, and mild pulmonary edema. Ultrasound right upper quadrant 1. Cholelithiasis with gallbladder wall thickening, mild pericholecystic edema, hyperemia, and positive sonographic Murphy sign, consistent with acute cholecystitis. 2. Mildly dilated intrahepatic and extrahepatic bile ducts with a 7.6 mm common bile duct, etiology not identified.  Patient was admitted to [facility] General surgery was consulted  MRCP showed cholelithiasis, cholecystitis no bile duct dilatation or intraluminal filling defects  Assessment and plan: Acute calculous cholecystitis Suspected choledocholithiasis versus passed distal 2 weeks postpartum presented with nausea, vomiting, abdominal pain  LFTs including alk phos and bilirubin elevated Ultrasound findings as above with acute cholecystitis, cholelithiasis as well as  mildly dilated CBD.   MRCP did not show CBD dilatation or intraluminal filling defect.  Patient probably had a stone that she was able to pass. LFT trend as below.  This morning, improving total bili but AST, ALT, alk phos  General Surgery as planned for cholecystectomy N.p.o. this morning Currently on IV Rocephin  Continue to monitor Recent Labs  Lab 04/05/24 1709 04/06/24 0424 04/07/24 0537  AST 116* 132* 135*  ALT 126* 125* 152*  ALKPHOS 255* 238* 262*  BILITOT 2.1* 3.0* 1.1  BILIDIR 0.9*  --   --   PROT 6.4* 5.4* 5.7*  ALBUMIN 3.3* 2.8* 3.0*  LIPASE 26  --   --   PLT 157 136* 158   Hypokalemia Potassium level improved with replacement Recent Labs  Lab 04/05/24 1709 04/06/24 0424 04/07/24 0537  K 3.5 3.1* 3.5   2 weeks postpartum Patient is 2 weeks postpartum  Baby at home with the father and doing well Mother continues to express breastmilk in the hospital No issues postpartum  Elevated blood pressure H/o pregnancy-induced hypertension Was not on antihypertensives at home  Initial blood pressure was elevated to 180s, overnight in 140s. Continue to monitor  H/o HSV-2 Patient took a course of acyclovir  during pregnancy  Liver hemangioma  MRI 11/27 showed an ovoid, well-circumscribed T2 hyperintense focus within the caudate lobe measuring 2.5 x 1.9 cm demonstrates circumferential arterial enhancement with central fill-in, possibly a hemangioma, although the continuous early arterial enhancement pattern is somewhat unusual. Radiologist recommended a follow-up contrast-enhanced MRI or liver protocol CT abdomen in 3-6 months to ensure stability.   Mild anemia Continue to monitor Recent Labs    03/22/24 1142 03/23/24 0426 04/05/24 1709 04/06/24 0424 04/07/24 0537  HGB 10.7* 9.2* 11.1* 10.2* 10.4*  MCV 91.0 90.1 90.8 90.4 88.5   Anxiety  disorder Fibromyalgia Does not take any home medication for this    Mobility: Independent able to ambulate.  Encourage  ambulation  Goals of care   Code Status: Full Code     DVT prophylaxis:  SCD's Start: 04/07/24 0959 SCDs Start: 04/05/24 2225   Antimicrobials: IV Rocephin  Fluid: None Consultants: General Surgery, OB Family Communication: None at bedside  Status: Inpatient Level of care:  Med-Surg   Patient is from: Home Needs to continue in-hospital care: Pending surgery today Anticipated d/c to: Ultimately home, hopefully tomorrow     Diet:  Diet Order             Diet NPO time specified  Diet effective midnight           Diet NPO time specified Except for: Sips with Meds  Diet effective midnight                   Scheduled Meds:  acetaminophen   1,000 mg Oral On Call to OR   celecoxib  200 mg Oral On Call to OR   Chlorhexidine Gluconate Cloth  6 each Topical Once   gabapentin  300 mg Oral On Call to OR   indocyanine green  2.5 mg Intravenous Once    PRN meds: [MAR Hold] acetaminophen , [MAR Hold] hydrALAZINE  **AND** [MAR Hold] hydrALAZINE  **AND** [MAR Hold] labetalol  **AND** [MAR Hold] labetalol  **AND** Measure blood pressure, [MAR Hold]  morphine  injection, [MAR Hold] ondansetron  (ZOFRAN ) IV, [MAR Hold] oxyCODONE    Infusions:   [MAR Hold] cefTRIAXone  (ROCEPHIN )  IV 2 g (04/06/24 2212)   cefTRIAXone  (ROCEPHIN )  IV     lactated ringers  10 mL/hr at 04/07/24 9078    Antimicrobials: Anti-infectives (From admission, onward)    Start     Dose/Rate Route Frequency Ordered Stop   04/07/24 1000  cefTRIAXone  (ROCEPHIN ) 2 g in sodium chloride  0.9 % 100 mL IVPB        2 g 200 mL/hr over 30 Minutes Intravenous On call to O.R. 04/07/24 0959 04/08/24 0559   04/05/24 2300  [MAR Hold]  cefTRIAXone  (ROCEPHIN ) 2 g in sodium chloride  0.9 % 100 mL IVPB        (MAR Hold since Fri 04/07/2024 at 0905.Hold Reason: Transfer to a Procedural area)   2 g 200 mL/hr over 30 Minutes Intravenous Every 24 hours 04/05/24 2214         Objective: Vitals:   04/07/24 0528 04/07/24 0911  BP: (!)  152/74 (!) (P) 156/76  Pulse: 77 (P) 71  Resp:  (P) 18  Temp: 98.5 F (36.9 C) (P) 98.6 F (37 C)  SpO2: 95% (P) 98%    Intake/Output Summary (Last 24 hours) at 04/07/2024 1056 Last data filed at 04/06/2024 1800 Gross per 24 hour  Intake 360 ml  Output --  Net 360 ml   Filed Weights   04/05/24 1617 04/07/24 0911  Weight: 119.3 kg (P) 119.3 kg   Weight change:  Body mass index is 45.14 kg/m (pended).   Physical Exam: General exam: Pleasant, young African-American female Skin: No rashes, lesions or ulcers. HEENT: Atraumatic, normocephalic, no obvious bleeding Lungs: Clear to auscultation bilaterally,  CVS: S1, S2, no murmur,   GI/Abd: Soft, mild epigastric and right upper quadrant tenderness present, nondistended, bowel sound present,   CNS: Alert, awake, oriented x 3 Psychiatry: Mood appropriate Extremities: No pedal edema, no calf tenderness,   Data Review: I have personally reviewed the laboratory data and studies available.  F/u labs ordered [employer] (  From admission, onward)    None       Signed, Chapman Rota, MD [facility] 04/07/2024

## 2024-04-07 NOTE — Plan of Care (Signed)

## 2024-04-07 NOTE — Anesthesia Postprocedure Evaluation (Signed)
 Anesthesia Post Note  Patient: Dominique Garza  Procedure(s) Performed: LAPAROSCOPIC CHOLECYSTECTOMY (Abdomen) INDOCYANINE GREEN FLUORESCENCE IMAGING (ICG) (Abdomen)     Patient location during evaluation: PACU Anesthesia Type: General Level of consciousness: awake and alert Pain management: pain level controlled Vital Signs Assessment: post-procedure vital signs reviewed and stable Respiratory status: spontaneous breathing, nonlabored ventilation and respiratory function stable Cardiovascular status: blood pressure returned to baseline and stable Postop Assessment: no apparent nausea or vomiting Anesthetic complications: no   No notable events documented.  Last Vitals:  Vitals:   04/07/24 1300 04/07/24 1312  BP: (!) 150/80 (!) 140/76  Pulse: 93   Resp: (!) 26 20  Temp: 37.2 C 37.2 C  SpO2: 92% 96%    Last Pain:  Vitals:   04/07/24 1300  TempSrc:   PainSc: 5    Pain Goal: Patients Stated Pain Goal: 5 (04/07/24 1300)                 Butler Levander Pinal

## 2024-04-07 NOTE — Discharge Instructions (Addendum)
 Home Care After Gallbladder Removal  Activity  Limit activity for the first 24 hours, then you may return to normal daily activities. Returning to normal daily activities as soon as you can following surgery will enhance recovery time.  No heavy lifting pushing or pulling, anything heavier than 10 pounds (gallon of milk weighs approx. 8.8 pounds) for 2 weeks from surgery date.  Do not mow the lawn, use a vacuum cleaner, or do any other strenuous activities without first consulting your surgical team.  Climb stairs slowly and watch your step.  Walk as often as you feel able to increase strength and endurance.  No driving or operating heavy machinery within 24 hours of taking narcotic pain medication.  Diet  Drink plenty of fluids and eat a light meal on the night of surgery. Some patients may find their appetite is poor for a week or two after surgery. This is a normal result of the stress of surgery-your appetite will return in time.   There are no specific diet restrictions after surgery and you resume regular diet as tolerated. However, you may want to refrain from fatty, heavy, and/or greasy foods and follow a low fat diet for 2-4 weeks to avoid temporary diarrhea and nausea. Slowly add fats back into diet.   If you have diarrhea, try avoiding spicy foods, dairy products, fatty foods, and alcohol. You can also watch to see if specific foods cause it, and stop eating them. If the diarrhea continues for more than 2 weeks, talk to your doctor.  Dressings and Wound Care  Keep your wound or incision site clean and dry.  You may have different types of dressings covering your incisions depending on your operation and your surgeon: o Dermabond/Durabond (skin glue): This will usually remain in place for 10-14 days, then naturally fall off your skin. You may take a shower 24 hrs after surgery, carefully wash, not scrub the incision site with a mild non-scented soap. Pat dry with a soft towel.  Do not pick  or peel skin glue off. o Combined outer dressing and inner steri-strips: The outer dressing is typically a plastic film that has a small bandage attached to it. This outer dressing can be removed two days after your operation. For example, if your operation was on Monday, the outer dressing can be removed Wednesday. This should be done for each site you have an incision. Once removed, you will expose the inner steri-strip which looks like a paper strip. Leave these on for about two weeks until they start to peel and ten you can take them off.  o Steri-strips alone: Leave these on for about two weeks until they start to peel and then you can take them off.   You can shower and let the water fall on the dressings above. Do not soak or submerge your incision(s) in a bath tub, hot tub, or swimming pool, until your doctor says it is ok to do so or the incision(s) have completely healed, usually about 2-4 weeks.  Do not use creams, powder, salves or balms on your incision(s).  What to Expect After Surgery  Moderate discomfort controlled with medications  You may feel pain in one or both shoulders. This pain comes from the gas still left in your belly after the surgery, if you had laparoscopic surgery (several small incisions). The pain should ease over several days to a week. Ambulation will help with this pain.   Minimal drainage from incision  Belly swelling  Feeling fatigue and weak  Loose stools after eating. This may occur for 4-8 weeks; however longer in some cases.   Constipation after surgery is common. Drink plenty fluids and eat a high fiber diet.   Pain Control: Prescribed Non-Narcotic Pain Medication  You will be given three prescriptions.  Two of them will be for prescription strength ibuprofen (i.e. Advil) and prescription strength acetaminophen (i.e. Tylenol).  The vast majority of patients will just need these two medications.  One prescription will be for a 'rescue' prescription of an oral  narcotic (oxycodone).  You may fill this if needed.  You will alternate taking the ibuprofen (600mg ) every 6 hours and also the acetaminophen (650mg ) every 6 hours so that you are taking one of those medications every 3 hours.  For example: o 0800 - take ibuprofen 600mg  o 1100 - take acetaminophen 650mg  o 1400 - take ibuprofen 600mg  o 1700 - take acetaminophen 650mg  o Etc.  Continue taking this alternating pattern of ibuprofen and acetaminophen for 3 days  If you cannot take one or the other of these medications, just take the one you can every 6 hours.  If you are comfortable at night, you don't have to wake up and take a medication.  If you are still uncomfortable after taking either ibuprofen or acetaminophen, try gentle stretching exercise and ice packs (a bag of frozen vegetables works great).  If you are still uncomfortable, you may fill the narcotic prescription of Oxycodone and take as directed.  Once you have completed these prescriptions, your pain level should be low enough to stop taking medications altogether or just use an over the counter medication (ibuprofen or acetaminophen) as needed.   Pain Control: Over the Counter Medications to take as needed  Colace/Docusate: May be prescribed by your surgeon to prevent constipation caused by the combination of narcotics, effects of anesthesia, and decreased ambulation.  Hold for loose stools or diarrhea. Take 100 mg 1-2 times a day starting tonight.   Fiber: High fiber foods, extra liquids (water 9-13 cups/day) can also assist with constipation. Examples of high fiber foods are fruit, bran. Prune juice and water are also good liquids to drink.  Milk of Magnesia/Miralax:  If constipated despite take the over the counter stool softeners, you may take Milk of Magnesia or Miralax as directed on bottle to assist with constipation.     Pepcid/Famotidine: May be prescribed while taking naproxen (Aleve) or other NSAIDs such as ibuprofen  (Motrin/Advil) to prevent stomach upset or Acid-reflux symptoms. Take 1 tablet 1-2 times a day.   **Constipation: The first bowel movement may occur anywhere between 1-5 days after surgery.  As long as you are not nauseated or not having significant abdominal pain this variation is acceptable. Narcotic pain medications can cause constipation increasing discomfort; early discontinuation will assist with bowel management.If constipated despite taking stool softeners,  you may take Milk of Magnesia or Miralax as directed on the bottle.     **Home medications: You may restart your home medications as directed by your respective Primary Care Physician or Surgeon.   When to notify your Doctor or Healthcare Team  Sign of Wound Infection   Fever over 100 degrees.  Wound becomes extremely swollen, shows red streaks, warm to the touch, and/or drainage from the incision site or foul-smelling drainage.  Wound edges separate or opens up  Bleeding or bruising   If you have bleeding, apply pressure to the site and hold the pressure firmly for 5  minutes. If the bleeding continues, apply pressure again and call 911. If the bleeding stopped, call your doctor to report it.   Call your doctor or nurse if you have increased bleeding from your site and increased bruising or a lump forms or gets larger under your skin at the site.  Unrelieved Pain    Call your doctor or nurse if your pain gets worse or is not eased 1 hour after taking your pain medicine, or if it is severe and uncontrolled.  Fever, Flu-like symptoms   Fever over 100 degrees and/or chills  Gastrointestinal Symptoms    If you have yellowing of your eyes or skin (jaundice)  If you have dark or rust-colored urine  If your stool is gray colored   If you have nausea and vomiting that continues more than 24 hours, will not let you keep medicine down and will not let you keep fluids down  Black tarry bowel movements. This can be normal after surgery  on the stomach, but should resolve in a day or two.    Call 911 if you suddenly have signs of blood loss such as:  Vomiting blood  Fast heart rate  Feeling faint, sweaty, or blacking out  Passing bright red blood from your rectum  Blood Clot Symptoms   Tender, swollen or reddened areas in your calf muscle or thighs.  Numbness or tingling in your lower leg or calf, or at the top of your leg or groin  Skin on your leg looks pale or blue or feels cold to touch  Chest pain or have trouble breathing, lightheadedness, fast heart rate  Sudden Onset of Symptoms    Call 911 if you suddenly have:  Leg weakness and spasm  Loss of bladder or bowel function  Seizure  Confusion, severe headache, dizziness or feeling unsteady, problems talking, difficulty swallowing, and/or numbness or muscle weakness as these could be signs of a stroke.   Follow up Appointment Your follow up appointment should be scheduled 2-3 weeks after your surgery date.  If you have not previously scheduled for a follow-up visit you can be scheduled by contacting [phone]

## 2024-04-08 ENCOUNTER — Encounter (HOSPITAL_COMMUNITY): Payer: Self-pay | Admitting: General Surgery

## 2024-04-08 DIAGNOSIS — K8001 Calculus of gallbladder with acute cholecystitis with obstruction: Secondary | ICD-10-CM | POA: Diagnosis not present

## 2024-04-08 DIAGNOSIS — K8000 Calculus of gallbladder with acute cholecystitis without obstruction: Secondary | ICD-10-CM | POA: Diagnosis not present

## 2024-04-08 MED ORDER — IBUPROFEN 200 MG PO TABS: 600.0000 mg | ORAL_TABLET | Freq: Four times a day (QID) | ORAL | 0 refills | Status: AC

## 2024-04-08 MED ORDER — OXYCODONE HCL 5 MG PO TABS: 5.0000 mg | ORAL_TABLET | Freq: Four times a day (QID) | ORAL | 0 refills | Status: DC | PRN

## 2024-04-08 MED ORDER — ACETAMINOPHEN 325 MG PO TABS: 650.0000 mg | ORAL_TABLET | Freq: Four times a day (QID) | ORAL | 0 refills | Status: AC

## 2024-04-08 NOTE — Discharge Summary (Signed)
 Physician Discharge Summary  Dominique Garza FMW:[id number] DOB: 12/20/1985 DOA: 04/05/2024  PCP: [name], FNP  Admit date: 04/05/2024 Discharge date: 04/08/2024  Admitted from: Home Discharge disposition: Home  Recommendations at discharge:  Exercise caution with the use of pain medicine  continue to monitor your blood pressure at home and follow-up with primary care provider. Advised weight loss   Subjective: Patient was seen and examined this morning. Lying in bed.  Not in distress. Pain controlled.  Slept well overnight. Tolerating regular diet Afebrile, Hemodynamically stable with blood pressure in 140s No labs this morning  Brief narrative: Dominique Garza is a 38 y.o. female with PMH significant for HSV-2, fibromyalgia, pregnancy-induced hypertension, anxiety who is 2 weeks postpartum following an uncomplicated pregnancy and vaginal delivery. 11/26, patient presented to the [facility] with complaint of 2 days of RUQ pain, nausea, vomiting.  Initially, patient was afebrile heart rate in 60s, blood pressure elevated to 180s, breathing on room air Initial labs with WBC count 7.7, hemoglobin 11.1, renal function normal, lipase normal at 26, AST, ALT, alk phos, total bili elevated Urinalysis with hazy yellow urine with large hemoglobin, small leukocytes, many bacteria Chest x-ray showed hazy lower lung opacities, small right pleural effusion, and mild pulmonary edema. Ultrasound right upper quadrant 1. Cholelithiasis with gallbladder wall thickening, mild pericholecystic edema, hyperemia, and positive sonographic Murphy sign, consistent with acute cholecystitis. 2. Mildly dilated intrahepatic and extrahepatic bile ducts with a 7.6 mm common bile duct, etiology not identified.  Patient was admitted to [facility] General surgery was consulted MRCP showed cholelithiasis, cholecystitis no bile duct dilatation or intraluminal filling defects 11/28, underwent lap chole  Hospital  course: Acute calculous cholecystitis S/p lap chole -11/28 Dr. Polly 2 weeks postpartum presented with nausea, vomiting, abdominal pain  LFTs including alk phos and bilirubin elevated Ultrasound findings as above with acute cholecystitis, cholelithiasis as well as mildly dilated CBD.   MRCP did not show CBD dilatation or intraluminal filling defect.   Patient probably had a stone that she was able to pass. Underwent laparoscopic cholecystectomy.  Pain controlled.  Slept well overnight. Tolerating regular diet Per general surgery, cleared for discharge without antibiotics. Recent Labs  Lab 04/05/24 1709 04/06/24 0424 04/07/24 0537  AST 116* 132* 135*  ALT 126* 125* 152*  ALKPHOS 255* 238* 262*  BILITOT 2.1* 3.0* 1.1  BILIDIR 0.9*  --   --   PROT 6.4* 5.4* 5.7*  ALBUMIN 3.3* 2.8* 3.0*  LIPASE 26  --   --   PLT 157 136* 158   Hypokalemia Potassium level improved with replacement Recent Labs  Lab 04/05/24 1709 04/06/24 0424 04/07/24 0537  K 3.5 3.1* 3.5   2 weeks postpartum Patient is 2 weeks postpartum  Baby at home with the father and doing well Mother continues to express breastmilk in the hospital No issues postpartum  Elevated blood pressure H/o pregnancy-induced hypertension Was not on antihypertensives at home  Initial blood pressure was elevated to 180s, currently running in 140s.  Patient states that during her last 2 pregnancies as well, she had elevated blood pressure towards the end and slowly normalized.  She is expecting that her blood pressure will improve in next few weeks.  I offered her low-dose of antihypertensive but she wants to monitor blood pressure without medicines for now and follow-up with her primary care provider. Continue to monitor  H/o HSV-2 Patient took a course of acyclovir  during pregnancy  Liver hemangioma  MRI 11/27 showed an ovoid,  well-circumscribed T2 hyperintense focus within the caudate lobe measuring 2.5 x 1.9 cm  demonstrates circumferential arterial enhancement with central fill-in, possibly a hemangioma, although the continuous early arterial enhancement pattern is somewhat unusual. Radiologist recommended a follow-up contrast-enhanced MRI or liver protocol CT abdomen in 3-6 months to ensure stability.   Mild anemia Continue to monitor Recent Labs    03/22/24 1142 03/23/24 0426 04/05/24 1709 04/06/24 0424 04/07/24 0537  HGB 10.7* 9.2* 11.1* 10.2* 10.4*  MCV 91.0 90.1 90.8 90.4 88.5   Anxiety disorder Fibromyalgia Does not take any home medication for this  Morbid Obesity  Body mass index is 45.14 kg/m (pended). Patient has been advised to make an attempt to improve diet and exercise patterns to aid in weight loss.     Mobility: Independent able to ambulate.  Encourage ambulation  Goals of care   Code Status: Full Code   Diet:  Diet Order             Diet general           Diet regular Room service appropriate? Yes; Fluid consistency: Thin  Diet effective now                   Nutritional status:  Body mass index is 45.14 kg/m (pended).       Wounds:  - Wound (Active)  No Date First Assessed or Time First Assessed found.      Assessments 04/07/2024 12:15 PM 04/07/2024 11:45 PM  Port 1 Site Assessment Clean;Dry Clean;Dry  Port 1 Margins Attached edges (approximated) Attached edges (approximated)  Port 1 Drainage Amount None None  Port 1 Dressing Type Liquid skin adhesive Liquid skin adhesive  Port 1 Dressing Status Clean, Dry, Intact Clean, Dry, Intact  Port 2 Site Assessment Clean;Dry Clean;Dry  Port 2 Margins Attached edges (approximated) Attached edges (approximated)  Port 2 Drainage Amount None None  Port 2 Dressing Type Liquid skin adhesive Liquid skin adhesive  Port 2 Dressing Status Clean, Dry, Intact Clean, Dry, Intact  Port 3 Site Assessment Clean;Dry Clean;Dry  Port 3 Margins Attached edges (approximated) Attached edges (approximated)  Port 3  Drainage Amount None None  Port 3 Dressing Type Liquid skin adhesive Liquid skin adhesive  Port 3 Dressing Status Clean, Dry, Intact Clean, Dry, Intact  Port 4 Site Assessment Clean;Dry Clean;Dry  Port 4 Margins Attached edges (approximated) Attached edges (approximated)  Port 4 Drainage Amount None None  Port 4 Dressing Type Liquid skin adhesive Liquid skin adhesive  Port 4 Dressing Status Clean, Dry, Intact Clean, Dry, Intact     No associated orders.     Wound 04/07/24 1110 Surgical Laparoscopic Abdomen 1: Umbilicus 2: Mid;Upper 3: Right;Upper 4: Right;Lateral (Active)  Date First Assessed/Time First Assessed: 04/07/24 1110   Primary Wound Type: Surgical  Secondary Wound Type - Surgical: Laparoscopic  Location: Abdomen  Number of Laparoscopic Ports: 4  Port: 1:  Location Orientation: Umbilicus  Port: 2:  Location Ori...    Assessments 04/07/2024 12:15 PM 04/07/2024  1:00 PM  Port 1 Site Assessment Clean;Dry Clean;Dry  Port 1 Margins Attached edges (approximated) Attached edges (approximated)  Port 1 Drainage Amount None --  Port 1 Dressing Type Liquid skin adhesive Liquid skin adhesive  Port 1 Dressing Status Clean, Dry, Intact Clean, Dry, Intact  Port 2 Site Assessment Clean;Dry Clean;Dry  Port 2 Margins Attached edges (approximated) Attached edges (approximated)  Port 2 Drainage Amount None --  Port 2 Dressing Type Liquid skin adhesive Liquid  skin adhesive  Port 2 Dressing Status Clean, Dry, Intact Clean, Dry, Intact  Port 3 Site Assessment Clean;Dry Clean;Dry  Port 3 Margins Attached edges (approximated) Attached edges (approximated)  Port 3 Drainage Amount None --  Port 3 Dressing Type Liquid skin adhesive Liquid skin adhesive  Port 3 Dressing Status Clean, Dry, Intact Clean, Dry, Intact  Port 4 Site Assessment Clean;Dry Clean;Dry  Port 4 Margins Attached edges (approximated) Attached edges (approximated)  Port 4 Drainage Amount None --  Port 4 Dressing Type Liquid skin  adhesive Liquid skin adhesive  Port 4 Dressing Status Clean, Dry, Intact Clean, Dry, Intact     No associated orders.    Discharge Medications:   Allergies as of 04/08/2024   No Known Allergies      Medication List     STOP taking these medications    furosemide  20 MG tablet Commonly known as: LASIX    potassium chloride  SA 20 MEQ tablet Commonly known as: KLOR-CON  M       TAKE these medications    Acetaminophen  Extra Strength 500 MG Tabs Take 2 tablets (1,000 mg total) by mouth every 6 (six) hours as needed for moderate pain (pain score 4-6). What changed: reasons to take this   acetaminophen  325 MG tablet Commonly known as: Tylenol  Take 2 tablets (650 mg total) by mouth every 6 (six) hours for 6 days. What changed: You were already taking a medication with the same name, and this prescription was added. Make sure you understand how and when to take each.   acyclovir  400 MG tablet Commonly known as: ZOVIRAX  Take 400 mg by mouth 3 (three) times daily.   ferrous sulfate  325 (65 FE) MG tablet Take 1 tablet (325 mg total) by mouth every other day.   ibuprofen  600 MG tablet Commonly known as: ADVIL  Take 1 tablet (600 mg total) by mouth every 6 (six) hours as needed for moderate pain (pain score 4-6). What changed: Another medication with the same name was added. Make sure you understand how and when to take each.   ibuprofen  200 MG tablet Commonly known as: Motrin  IB Take 3 tablets (600 mg total) by mouth every 6 (six) hours for 6 days. What changed: You were already taking a medication with the same name, and this prescription was added. Make sure you understand how and when to take each.   oxyCODONE  5 MG immediate release tablet Commonly known as: Oxy IR/ROXICODONE  Take 1 tablet (5 mg total) by mouth every 6 (six) hours as needed.   Stool Softener/Laxative 50-8.6 MG tablet Generic drug: senna-docusate Take 2 tablets by mouth at bedtime as needed for mild  constipation or moderate constipation.         Follow ups:    Follow-up Information     Maczis, Puja Gosai, PA-C Follow up in 3 week(s).   Specialty: General Surgery Why: Office will call you with a follow up appointment, If you don't hear from the office, please call, Arrive 30 minutes prior to your appointment time, Please bring your insurance card and photo ID Contact information: [address] [place] [address] [facility] [place] [address] [phone]         [name], FNP Follow up.   Specialty: Family Medicine Contact information: [address] [place] [address] [phone]                 Discharge Instructions:   Discharge Instructions     Call MD  for:  difficulty breathing, headache or visual disturbances   Complete by: As directed    Call MD for:  extreme fatigue   Complete by: As directed    Call MD for:  hives   Complete by: As directed    Call MD for:  persistant dizziness or light-headedness   Complete by: As directed    Call MD for:  persistant nausea and vomiting   Complete by: As directed    Call MD for:  severe uncontrolled pain   Complete by: As directed    Call MD for:  temperature >100.4   Complete by: As directed    Diet general   Complete by: As directed    Discharge instructions   Complete by: As directed    Recommendations at discharge:   Exercise caution with the use of pain medicine   continue to monitor your blood pressure at home and follow-up with primary care provider.  Advised weight loss  PDMP reviewed this encounter.   Opioid taper instructions: It is important to wean off of your opioid medication as soon as possible. If you do not need pain medication after your surgery it is ok to stop day one. Opioids include: Codeine, Hydrocodone (Norco, Vicodin), Oxycodone (Percocet, oxycontin ) and hydromorphone  amongst others.  Long term and even short term use of opiods can  cause: Increased pain response Dependence Constipation Depression Respiratory depression And more.  Withdrawal symptoms can include Flu like symptoms Nausea, vomiting And more Techniques to manage these symptoms Hydrate well Eat regular healthy meals Stay active Use relaxation techniques(deep breathing, meditating, yoga) Do Not substitute Alcohol to help with tapering If you have been on opioids for less than two weeks and do not have pain than it is ok to stop all together.  Plan to wean off of opioids This plan should start within one week post op of your joint replacement. Maintain the same interval or time between taking each dose and first decrease the dose.  Cut the total daily intake of opioids by one tablet each day Next start to increase the time between doses. The last dose that should be eliminated is the evening dose.        General discharge instructions: Follow with Primary MD Bacchus, Meade PEDLAR, FNP in 7 days  Please request your PCP  to go over your hospital tests, procedures, radiology results at the follow up. Please get your medicines reviewed and adjusted.  Your PCP may decide to repeat certain labs or tests as needed. Do not drive, operate heavy machinery, perform activities at heights, swimming or participation in water activities or provide baby sitting services if your were admitted for syncope or siezures until you have seen by Primary MD or a Neurologist and advised to do so again. [place]  Controlled Substance Reporting System database was reviewed. Do not drive, operate heavy machinery, perform activities at heights, swim, participate in water activities or provide baby-sitting services while on medications for pain, sleep and mood until your outpatient physician has reevaluated you and advised to do so again.  You are strongly recommended to comply with the dose, frequency and duration of prescribed medications. Activity: As tolerated with Full fall  precautions use walker/cane & assistance as needed Avoid using any recreational substances like cigarette, tobacco, alcohol, or non-prescribed drug. If you experience worsening of your admission symptoms, develop shortness of breath, life threatening emergency, suicidal or homicidal thoughts you must seek medical attention immediately by calling 911 or calling your MD immediately  if symptoms less severe. You must read complete instructions/literature along with all the possible adverse reactions/side effects for all the medicines you take and that have been prescribed to you. Take any new medicine only after you have completely understood and accepted all the possible adverse reactions/side effects.  Wear Seat belts while driving. You were cared for by a hospitalist during your hospital stay. If you have any questions about your discharge medications or the care you received while you were in the hospital after you are discharged, you can call the unit and ask to speak with the hospitalist or the covering physician. Once you are discharged, your primary care physician will handle any further medical issues. Please note that NO REFILLS for any discharge medications will be authorized once you are discharged, as it is imperative that you return to your primary care physician (or establish a relationship with a primary care physician if you do not have one).   Increase activity slowly   Complete by: As directed        Discharge Exam:   Vitals:   04/07/24 1830 04/07/24 2055 04/08/24 0530 04/08/24 1011  BP: (!) 146/73 (!) 146/72 (!) 148/76 119/79  Pulse: 70 84 81 81  Resp: 17   18  Temp: 99.1 F (37.3 C) 99.2 F (37.3 C) 99.4 F (37.4 C) 98.5 F (36.9 C)  TempSrc: Oral Oral Oral   SpO2: 94% 95% 94% 98%  Weight:      Height:        Body mass index is 45.14 kg/m (pended).  General exam: Pleasant, young African-American female Skin: No rashes, lesions or ulcers. HEENT: Atraumatic,  normocephalic, no obvious bleeding Lungs: Clear to auscultation bilaterally,  CVS: S1, S2, no murmur,   GI/Abd: Soft, mild appropriate postop tenderness, nondistended, bowel sound present,   CNS: Alert, awake, oriented x 3 Psychiatry: Mood appropriate Extremities: No pedal edema, no calf tenderness,    The results of significant diagnostics from this hospitalization (including imaging, microbiology, ancillary and laboratory) are listed below for reference.    Procedures and Diagnostic Studies:   MR ABDOMEN MRCP W WO CONTAST Result Date: 04/06/2024 CLINICAL DATA:  Cholelithiasis EXAM: MRI ABDOMEN WITHOUT AND WITH CONTRAST (INCLUDING MRCP) TECHNIQUE: Multiplanar multisequence MR imaging of the abdomen was performed both before and after the administration of intravenous contrast. Heavily T2-weighted images of the biliary and pancreatic ducts were obtained. Post-processing was applied at the acquisition scanner with concurrent physician supervision which includes 3D reconstructions, MIPs, volume rendered images and/or shaded surface rendering. CONTRAST:  10mL GADAVIST  GADOBUTROL  1 MMOL/ML IV SOLN COMPARISON:  Ultrasound abdomen dated 04/05/2024 CT abdomen and pelvis dated 02/07/2023 FINDINGS: Lower chest: Small right pleural effusion. Hepatobiliary: Ovoid, well-circumscribed T2 hyperintense focus within the caudate lobe measuring 2.5 x 1.9 cm (5:17) demonstrates circumferential arterial enhancement with central fill-in. Heterogeneous enhancement of the inferior right hepatic lobe, likely secondary to volume averaging artifact from the adjacent colon. No bile duct dilation or intraluminal filling defects. Nondistended gallbladder with cholelithiasis and marked circumferential gallbladder edema. Pancreas: No mass, inflammatory changes, or other parenchymal abnormality identified. Spleen:  Within normal limits in size and appearance. Adrenals/Urinary Tract: No adrenal nodules. No suspicious renal masses  identified. No evidence of hydronephrosis. Stomach/Bowel: Visualized portions within the abdomen are unremarkable. Vascular/Lymphatic: No pathologically enlarged lymph nodes identified. No abdominal aortic aneurysm demonstrated. Other: Partially imaged enlarged, postpartum uterus. Trace pelvic free fluid. Musculoskeletal: No suspicious bone lesions identified. Partially imaged bilateral breast with multifocal cystic foci, likely  related to lactation. Mild body wall edema. IMPRESSION: 1. Nondistended gallbladder with cholelithiasis and marked circumferential gallbladder edema, which may represent acute cholecystitis, or may be reactive if there is concurrent hepatitis. No bile duct dilation or intraluminal filling defects. 2. Ovoid, well-circumscribed T2 hyperintense focus within the caudate lobe measuring 2.5 x 1.9 cm demonstrates circumferential arterial enhancement with central fill-in, possibly a hemangioma, although the continuous early arterial enhancement pattern is somewhat unusual. Recommend follow-up contrast-enhanced MRI or liver protocol CT abdomen in 3-6 months to ensure stability. 3. Small right pleural effusion. Electronically Signed   By: Limin  Xu M.D.   On: 04/06/2024 17:01   MR 3D Recon At Scanner Result Date: 04/06/2024 CLINICAL DATA:  Cholelithiasis EXAM: MRI ABDOMEN WITHOUT AND WITH CONTRAST (INCLUDING MRCP) TECHNIQUE: Multiplanar multisequence MR imaging of the abdomen was performed both before and after the administration of intravenous contrast. Heavily T2-weighted images of the biliary and pancreatic ducts were obtained. Post-processing was applied at the acquisition scanner with concurrent physician supervision which includes 3D reconstructions, MIPs, volume rendered images and/or shaded surface rendering. CONTRAST:  10mL GADAVIST  GADOBUTROL  1 MMOL/ML IV SOLN COMPARISON:  Ultrasound abdomen dated 04/05/2024 CT abdomen and pelvis dated 02/07/2023 FINDINGS: Lower chest: Small right  pleural effusion. Hepatobiliary: Ovoid, well-circumscribed T2 hyperintense focus within the caudate lobe measuring 2.5 x 1.9 cm (5:17) demonstrates circumferential arterial enhancement with central fill-in. Heterogeneous enhancement of the inferior right hepatic lobe, likely secondary to volume averaging artifact from the adjacent colon. No bile duct dilation or intraluminal filling defects. Nondistended gallbladder with cholelithiasis and marked circumferential gallbladder edema. Pancreas: No mass, inflammatory changes, or other parenchymal abnormality identified. Spleen:  Within normal limits in size and appearance. Adrenals/Urinary Tract: No adrenal nodules. No suspicious renal masses identified. No evidence of hydronephrosis. Stomach/Bowel: Visualized portions within the abdomen are unremarkable. Vascular/Lymphatic: No pathologically enlarged lymph nodes identified. No abdominal aortic aneurysm demonstrated. Other: Partially imaged enlarged, postpartum uterus. Trace pelvic free fluid. Musculoskeletal: No suspicious bone lesions identified. Partially imaged bilateral breast with multifocal cystic foci, likely related to lactation. Mild body wall edema. IMPRESSION: 1. Nondistended gallbladder with cholelithiasis and marked circumferential gallbladder edema, which may represent acute cholecystitis, or may be reactive if there is concurrent hepatitis. No bile duct dilation or intraluminal filling defects. 2. Ovoid, well-circumscribed T2 hyperintense focus within the caudate lobe measuring 2.5 x 1.9 cm demonstrates circumferential arterial enhancement with central fill-in, possibly a hemangioma, although the continuous early arterial enhancement pattern is somewhat unusual. Recommend follow-up contrast-enhanced MRI or liver protocol CT abdomen in 3-6 months to ensure stability. 3. Small right pleural effusion. Electronically Signed   By: Limin  Xu M.D.   On: 04/06/2024 17:01   DG Chest Portable 1 View Result Date:  04/05/2024 EXAM: 1 VIEW(S) XRAY OF THE CHEST 04/05/2024 08:31:00 PM COMPARISON: 11/23/2017 CLINICAL HISTORY: SOB, fluid around lungs on RUQ US  FINDINGS: LUNGS AND PLEURA: Hazy lower lung opacities. Small right pleural effusion. Mild pulmonary edema. No pneumothorax. HEART AND MEDIASTINUM: Cardiomegaly. BONES AND SOFT TISSUES: No acute osseous abnormality. IMPRESSION: 1. Hazy lower lung opacities, small right pleural effusion, and mild pulmonary edema. 2. Cardiomegaly. Electronically signed by: Oneil Devonshire MD 04/05/2024 08:34 PM EST RP Workstation: [id number]   US  ABDOMEN LIMITED RUQ (LIVER/GB) Result Date: 04/05/2024 EXAM: Right Upper Quadrant Abdominal Ultrasound 04/05/2024 07:53:57 PM TECHNIQUE: Real-time ultrasonography of the right upper quadrant of the abdomen was performed. COMPARISON: CT abdomen and pelvis 02/07/2023. CLINICAL HISTORY: RUQ pain; Total bilirubin, elevated. 2 weeks postpartum. FINDINGS: LIVER: The  liver demonstrates normal echogenicity. Mild intrahepatic bile duct dilatation. No evidence of mass. BILIARY SYSTEM: Cholelithiasis with small stones in the gallbladder. Gallbladder wall thickening with mild pericholecystic edema. The gallbladder wall measures 6.5 mm in thickness. Increased vascularity suggested on color flow doppler imaging. Murphy sign is positive. Bile ducts are mildly dilated, with the common bile duct diameter measured at 7.6 mm. The cause is not identified. RIGHT KIDNEY: The right kidney is grossly unremarkable in appearances without evidence of hydronephrosis, echogenic calculi or worrisome mass lesions. PANCREAS: Visualized portions of the pancreas are unremarkable. OTHER: No right upper quadrant ascites. Incidental finding of small right pleural effusion. IMPRESSION: 1. Cholelithiasis with gallbladder wall thickening, mild pericholecystic edema, hyperemia, and positive sonographic Murphy sign, consistent with acute cholecystitis. 2. Mildly dilated intrahepatic and  extrahepatic bile ducts with a 7.6 mm common bile duct, etiology not identified. 3. Small right pleural effusion. Electronically signed by: Elsie Gravely MD 04/05/2024 07:59 PM EST RP Workstation: [id number]     Labs:   Basic Metabolic Panel: Recent Labs  Lab 04/05/24 1709 04/06/24 0424 04/07/24 0537  NA 140 140 138  K 3.5 3.1* 3.5  CL 107 105 105  CO2 23 24 24   GLUCOSE 88 82 86  BUN 8 7 5*  CREATININE 0.89 0.95 0.88  CALCIUM 8.6* 8.2* 8.6*   GFR Estimated Creatinine Clearance: 111.2 mL/min (by C-G formula based on SCr of 0.88 mg/dL). Liver Function Tests: Recent Labs  Lab 04/05/24 1709 04/06/24 0424 04/07/24 0537  AST 116* 132* 135*  ALT 126* 125* 152*  ALKPHOS 255* 238* 262*  BILITOT 2.1* 3.0* 1.1  PROT 6.4* 5.4* 5.7*  ALBUMIN 3.3* 2.8* 3.0*   Recent Labs  Lab 04/05/24 1709  LIPASE 26  AMYLASE 43   No results for input(s): AMMONIA in the last 168 hours. Coagulation profile No results for input(s): INR, PROTIME in the last 168 hours.  CBC: Recent Labs  Lab 04/05/24 1709 04/06/24 0424 04/07/24 0537  WBC 7.7 5.4 5.7  HGB 11.1* 10.2* 10.4*  HCT 32.7* 30.0* 30.8*  MCV 90.8 90.4 88.5  PLT 157 136* 158   Cardiac Enzymes: No results for input(s): CKTOTAL, CKMB, CKMBINDEX, TROPONINI in the last 168 hours. BNP: Invalid input(s): POCBNP CBG: No results for input(s): GLUCAP in the last 168 hours. D-Dimer No results for input(s): DDIMER in the last 72 hours. Hgb A1c No results for input(s): HGBA1C in the last 72 hours. Lipid Profile No results for input(s): CHOL, HDL, LDLCALC, TRIG, CHOLHDL, LDLDIRECT in the last 72 hours. Thyroid function studies No results for input(s): TSH, T4TOTAL, T3FREE, THYROIDAB in the last 72 hours.  Invalid input(s): FREET3 Anemia work up No results for input(s): VITAMINB12, FOLATE, FERRITIN, TIBC, IRON , RETICCTPCT in the last 72 hours. Microbiology Recent Results  (from the past 240 hours)  Urine Culture     Status: None   Collection Time: 03/29/24  4:57 PM   Specimen: Urine   UR  Result Value Ref Range Status   Urine Culture, Routine Final report  Final   Organism ID, Bacteria Comment  Final    Comment: Mixed urogenital flora 50,000-100,000 colony forming units per mL   Culture, OB Urine     Status: Abnormal   Collection Time: 04/05/24  4:10 PM   Specimen: Urine, Random  Result Value Ref Range Status   Specimen Description URINE, RANDOM  Final   Special Requests NONE  Final   Culture (A)  Final    >=100,000 COLONIES/mL KLEBSIELLA PNEUMONIAE >=  100,000 COLONIES/mL DIPHTHEROIDS(CORYNEBACTERIUM SPECIES) Standardized susceptibility testing for this organism is not available. NO GROUP B STREP (S.AGALACTIAE) ISOLATED Performed at [facility], [address] [place], [place] [address]    Report Status 04/07/2024 FINAL  Final   Organism ID, Bacteria KLEBSIELLA PNEUMONIAE (A)  Final      Susceptibility   Klebsiella pneumoniae - MIC*    AMPICILLIN RESISTANT Resistant     CEFAZOLIN (URINE) Value in next row Sensitive      4 SENSITIVEThis is a modified FDA-approved test that has been validated and its performance characteristics determined by the reporting laboratory.  This laboratory is certified under the Clinical Laboratory Improvement Amendments CLIA as qualified to perform high complexity clinical laboratory testing.    CEFEPIME Value in next row Sensitive      4 SENSITIVEThis is a modified FDA-approved test that has been validated and its performance characteristics determined by the reporting laboratory.  This laboratory is certified under the Clinical Laboratory Improvement Amendments CLIA as qualified to perform high complexity clinical laboratory testing.    ERTAPENEM Value in next row Sensitive      4 SENSITIVEThis is a modified FDA-approved test that has been validated and its performance characteristics determined by the reporting  laboratory.  This laboratory is certified under the Clinical Laboratory Improvement Amendments CLIA as qualified to perform high complexity clinical laboratory testing.    CEFTRIAXONE  Value in next row Sensitive      4 SENSITIVEThis is a modified FDA-approved test that has been validated and its performance characteristics determined by the reporting laboratory.  This laboratory is certified under the Clinical Laboratory Improvement Amendments CLIA as qualified to perform high complexity clinical laboratory testing.    CIPROFLOXACIN Value in next row Sensitive      4 SENSITIVEThis is a modified FDA-approved test that has been validated and its performance characteristics determined by the reporting laboratory.  This laboratory is certified under the Clinical Laboratory Improvement Amendments CLIA as qualified to perform high complexity clinical laboratory testing.    GENTAMICIN Value in next row Sensitive      4 SENSITIVEThis is a modified FDA-approved test that has been validated and its performance characteristics determined by the reporting laboratory.  This laboratory is certified under the Clinical Laboratory Improvement Amendments CLIA as qualified to perform high complexity clinical laboratory testing.    NITROFURANTOIN  Value in next row Resistant      4 SENSITIVEThis is a modified FDA-approved test that has been validated and its performance characteristics determined by the reporting laboratory.  This laboratory is certified under the Clinical Laboratory Improvement Amendments CLIA as qualified to perform high complexity clinical laboratory testing.    TRIMETH /SULFA  Value in next row Sensitive      4 SENSITIVEThis is a modified FDA-approved test that has been validated and its performance characteristics determined by the reporting laboratory.  This laboratory is certified under the Clinical Laboratory Improvement Amendments CLIA as qualified to perform high complexity clinical laboratory testing.     AMPICILLIN/SULBACTAM Value in next row Sensitive      4 SENSITIVEThis is a modified FDA-approved test that has been validated and its performance characteristics determined by the reporting laboratory.  This laboratory is certified under the Clinical Laboratory Improvement Amendments CLIA as qualified to perform high complexity clinical laboratory testing.    PIP/TAZO Value in next row Sensitive      <=4 SENSITIVEThis is a modified FDA-approved test that has been validated and its performance characteristics determined by the  reporting laboratory.  This laboratory is certified under the Clinical Laboratory Improvement Amendments CLIA as qualified to perform high complexity clinical laboratory testing.    MEROPENEM Value in next row Sensitive      <=4 SENSITIVEThis is a modified FDA-approved test that has been validated and its performance characteristics determined by the reporting laboratory.  This laboratory is certified under the Clinical Laboratory Improvement Amendments CLIA as qualified to perform high complexity clinical laboratory testing.    * >=100,000 COLONIES/mL KLEBSIELLA PNEUMONIAE    Time coordinating discharge: 45 minutes  Signed: Krishna Dancel  [facility] 04/08/2024, 10:47 AM

## 2024-04-08 NOTE — Plan of Care (Signed)
  Problem: Education: Goal: Knowledge of disease or condition will improve Outcome: Progressing   Problem: Fluid Volume: Goal: Peripheral tissue perfusion will improve Outcome: Progressing   Problem: Education: Goal: Knowledge of General Education information will improve Description: Including pain rating scale, medication(s)/side effects and non-pharmacologic comfort measures Outcome: Progressing   Problem: Activity: Goal: Risk for activity intolerance will decrease Outcome: Progressing   Problem: Nutrition: Goal: Adequate nutrition will be maintained Outcome: Progressing   Problem: Coping: Goal: Level of anxiety will decrease Outcome: Progressing

## 2024-04-08 NOTE — Progress Notes (Signed)
 DISCHARGE NOTE HOME Dominique Garza to be discharged Home per MD order. Discussed prescriptions and follow up appointments with the patient. Prescriptions given to patient; medication list explained in detail. Patient verbalized understanding.  Skin clean, dry and intact without evidence of skin break down, no evidence of skin tears noted. IV catheter discontinued intact. Site without signs and symptoms of complications. Dressing and pressure applied. Pt denies pain at the site currently. No complaints noted.  See LDA for Laparoscopic incision sites at discharge Patient free of lines, drains, and wounds.   An After Visit Summary (AVS) was printed and given to the patient. Patient escorted via wheelchair, and discharged home via private auto.  Peyton SHAUNNA Pepper, RN

## 2024-04-08 NOTE — Progress Notes (Signed)
 1 Day Post-Op  Subjective: Some soreness at umbilical incision, otherwise pain well controlled.  Tolerating a solid diet.  Mobilizing and voiding  Objective: Vital signs in last 24 hours: Temp:  [98.5 F (36.9 C)-99.4 F (37.4 C)] 98.5 F (36.9 C) (11/29 1011) Pulse Rate:  [70-93] 81 (11/29 1011) Resp:  [17-29] 18 (11/29 1011) BP: (119-151)/(71-80) 119/79 (11/29 1011) SpO2:  [91 %-98 %] 98 % (11/29 1011) Last BM Date : 04/05/24  Intake/Output from previous day: 11/28 0701 - 11/29 0700 In: 850 [I.V.:700; IV Piggyback:150] Out: -  Intake/Output this shift: No intake/output data recorded.  PE: Abd: soft, appropriately tender, incisions c/d/i  Lab Results:  Recent Labs    04/06/24 0424 04/07/24 0537  WBC 5.4 5.7  HGB 10.2* 10.4*  HCT 30.0* 30.8*  PLT 136* 158   BMET Recent Labs    04/06/24 0424 04/07/24 0537  NA 140 138  K 3.1* 3.5  CL 105 105  CO2 24 24  GLUCOSE 82 86  BUN 7 5*  CREATININE 0.95 0.88  CALCIUM 8.2* 8.6*   PT/INR No results for input(s): LABPROT, INR in the last 72 hours. CMP     Component Value Date/Time   NA 138 04/07/2024 0537   NA 135 08/13/2023 1401   K 3.5 04/07/2024 0537   CL 105 04/07/2024 0537   CO2 24 04/07/2024 0537   GLUCOSE 86 04/07/2024 0537   BUN 5 (L) 04/07/2024 0537   BUN 8 08/13/2023 1401   CREATININE 0.88 04/07/2024 0537   CALCIUM 8.6 (L) 04/07/2024 0537   PROT 5.7 (L) 04/07/2024 0537   PROT 6.8 08/13/2023 1401   ALBUMIN 3.0 (L) 04/07/2024 0537   ALBUMIN 4.1 08/13/2023 1401   AST 135 (H) 04/07/2024 0537   ALT 152 (H) 04/07/2024 0537   ALKPHOS 262 (H) 04/07/2024 0537   BILITOT 1.1 04/07/2024 0537   BILITOT 0.3 08/13/2023 1401   GFRNONAA >60 04/07/2024 0537   GFRAA >60 11/23/2017 0243   Lipase     Component Value Date/Time   LIPASE 26 04/05/2024 1709       Studies/Results: MR ABDOMEN MRCP W WO CONTAST Result Date: 04/06/2024 CLINICAL DATA:  Cholelithiasis EXAM: MRI ABDOMEN WITHOUT AND WITH  CONTRAST (INCLUDING MRCP) TECHNIQUE: Multiplanar multisequence MR imaging of the abdomen was performed both before and after the administration of intravenous contrast. Heavily T2-weighted images of the biliary and pancreatic ducts were obtained. Post-processing was applied at the acquisition scanner with concurrent physician supervision which includes 3D reconstructions, MIPs, volume rendered images and/or shaded surface rendering. CONTRAST:  10mL GADAVIST  GADOBUTROL  1 MMOL/ML IV SOLN COMPARISON:  Ultrasound abdomen dated 04/05/2024 CT abdomen and pelvis dated 02/07/2023 FINDINGS: Lower chest: Small right pleural effusion. Hepatobiliary: Ovoid, well-circumscribed T2 hyperintense focus within the caudate lobe measuring 2.5 x 1.9 cm (5:17) demonstrates circumferential arterial enhancement with central fill-in. Heterogeneous enhancement of the inferior right hepatic lobe, likely secondary to volume averaging artifact from the adjacent colon. No bile duct dilation or intraluminal filling defects. Nondistended gallbladder with cholelithiasis and marked circumferential gallbladder edema. Pancreas: No mass, inflammatory changes, or other parenchymal abnormality identified. Spleen:  Within normal limits in size and appearance. Adrenals/Urinary Tract: No adrenal nodules. No suspicious renal masses identified. No evidence of hydronephrosis. Stomach/Bowel: Visualized portions within the abdomen are unremarkable. Vascular/Lymphatic: No pathologically enlarged lymph nodes identified. No abdominal aortic aneurysm demonstrated. Other: Partially imaged enlarged, postpartum uterus. Trace pelvic free fluid. Musculoskeletal: No suspicious bone lesions identified. Partially imaged bilateral breast with  multifocal cystic foci, likely related to lactation. Mild body wall edema. IMPRESSION: 1. Nondistended gallbladder with cholelithiasis and marked circumferential gallbladder edema, which may represent acute cholecystitis, or may be  reactive if there is concurrent hepatitis. No bile duct dilation or intraluminal filling defects. 2. Ovoid, well-circumscribed T2 hyperintense focus within the caudate lobe measuring 2.5 x 1.9 cm demonstrates circumferential arterial enhancement with central fill-in, possibly a hemangioma, although the continuous early arterial enhancement pattern is somewhat unusual. Recommend follow-up contrast-enhanced MRI or liver protocol CT abdomen in 3-6 months to ensure stability. 3. Small right pleural effusion. Electronically Signed   By: Limin  Xu M.D.   On: 04/06/2024 17:01   MR 3D Recon At Scanner Result Date: 04/06/2024 CLINICAL DATA:  Cholelithiasis EXAM: MRI ABDOMEN WITHOUT AND WITH CONTRAST (INCLUDING MRCP) TECHNIQUE: Multiplanar multisequence MR imaging of the abdomen was performed both before and after the administration of intravenous contrast. Heavily T2-weighted images of the biliary and pancreatic ducts were obtained. Post-processing was applied at the acquisition scanner with concurrent physician supervision which includes 3D reconstructions, MIPs, volume rendered images and/or shaded surface rendering. CONTRAST:  10mL GADAVIST  GADOBUTROL  1 MMOL/ML IV SOLN COMPARISON:  Ultrasound abdomen dated 04/05/2024 CT abdomen and pelvis dated 02/07/2023 FINDINGS: Lower chest: Small right pleural effusion. Hepatobiliary: Ovoid, well-circumscribed T2 hyperintense focus within the caudate lobe measuring 2.5 x 1.9 cm (5:17) demonstrates circumferential arterial enhancement with central fill-in. Heterogeneous enhancement of the inferior right hepatic lobe, likely secondary to volume averaging artifact from the adjacent colon. No bile duct dilation or intraluminal filling defects. Nondistended gallbladder with cholelithiasis and marked circumferential gallbladder edema. Pancreas: No mass, inflammatory changes, or other parenchymal abnormality identified. Spleen:  Within normal limits in size and appearance.  Adrenals/Urinary Tract: No adrenal nodules. No suspicious renal masses identified. No evidence of hydronephrosis. Stomach/Bowel: Visualized portions within the abdomen are unremarkable. Vascular/Lymphatic: No pathologically enlarged lymph nodes identified. No abdominal aortic aneurysm demonstrated. Other: Partially imaged enlarged, postpartum uterus. Trace pelvic free fluid. Musculoskeletal: No suspicious bone lesions identified. Partially imaged bilateral breast with multifocal cystic foci, likely related to lactation. Mild body wall edema. IMPRESSION: 1. Nondistended gallbladder with cholelithiasis and marked circumferential gallbladder edema, which may represent acute cholecystitis, or may be reactive if there is concurrent hepatitis. No bile duct dilation or intraluminal filling defects. 2. Ovoid, well-circumscribed T2 hyperintense focus within the caudate lobe measuring 2.5 x 1.9 cm demonstrates circumferential arterial enhancement with central fill-in, possibly a hemangioma, although the continuous early arterial enhancement pattern is somewhat unusual. Recommend follow-up contrast-enhanced MRI or liver protocol CT abdomen in 3-6 months to ensure stability. 3. Small right pleural effusion. Electronically Signed   By: Limin  Xu M.D.   On: 04/06/2024 17:01    Anti-infectives: Anti-infectives (From admission, onward)    Start     Dose/Rate Route Frequency Ordered Stop   04/07/24 1000  cefTRIAXone  (ROCEPHIN ) 2 g in sodium chloride  0.9 % 100 mL IVPB        2 g 200 mL/hr over 30 Minutes Intravenous On call to O.R. 04/07/24 0959 04/07/24 1133   04/05/24 2300  cefTRIAXone  (ROCEPHIN ) 2 g in sodium chloride  0.9 % 100 mL IVPB  Status:  Discontinued        2 g 200 mL/hr over 30 Minutes Intravenous Every 24 hours 04/05/24 2214 04/07/24 1651        Assessment/Plan POD 1, s/p lap chole for cholecystitis, Dr. Polly 11/28 -doing well this morning -tolerating a diet, pain well controlled, mobilizing, and  voiding -  surgically stable for DC home from our standpoint -relayed to primary service.  FEN - regular VTE - ok for chemical prophylaxis from our standpoint ID - rocephin , no further needed  2 weeks post partum   LOS: 3 days    Burnard FORBES Banter , [facility] 04/08/2024, 10:15 AM Please see Amion for pager number during day hours 7:00am-4:30pm or 7:00am -11:30am on weekends

## 2024-04-10 ENCOUNTER — Telehealth: Payer: Self-pay

## 2024-04-10 ENCOUNTER — Other Ambulatory Visit

## 2024-04-10 LAB — SURGICAL PATHOLOGY

## 2024-04-10 NOTE — Transitions of Care (Post Inpatient/ED Visit) (Signed)
 04/10/2024  Name: Dominique Garza MRN: [id number] DOB: 02/08/1986  Today's TOC FU Call Status: Today's TOC FU Call Status:: Successful TOC FU Call Completed TOC FU Call Complete Date: 04/10/24  Patient's Name and Date of Birth confirmed. DOB, Name  Transition Care Management Follow-up Telephone Call Date of Discharge: 04/08/24 Discharge Facility: [facility]) Type of Discharge: Inpatient Admission Primary Inpatient Discharge Diagnosis:: Cholelithiases How have you been since you were released from the hospital?: Better Any questions or concerns?: Yes Patient Questions/Concerns:: (S) Blood pressure continues to be elevated 157/100 today Patient Questions/Concerns Addressed: Notified Provider of Patient Questions/Concerns  Items Reviewed: Did you receive and understand the discharge instructions provided?: Yes Medications obtained,verified, and reconciled?: Yes (Medications Reviewed) Any new allergies since your discharge?: No Dietary orders reviewed?: NA Do you have support at home?: Yes  Medications Reviewed Today: Medications Reviewed Today     Reviewed by Lavelle Charmaine NOVAK, LPN (Licensed Practical Nurse) on 04/10/24 at 1336  Med List Status: <None>   Medication Order Taking? Sig Documenting Provider Last Dose Status Informant  acetaminophen  (TYLENOL ) 325 MG tablet [id number]  Take 2 tablets (650 mg total) by mouth every 6 (six) hours for 6 days. Tammy Sor, PA-C  Active   acetaminophen  (TYLENOL ) 500 MG tablet [id number] No Take 2 tablets (1,000 mg total) by mouth every 6 (six) hours as needed for moderate pain (pain score 4-6).  Patient taking differently: Take 1,000 mg by mouth every 6 (six) hours as needed for moderate pain (pain score 4-6) or mild pain (pain score 1-3).   Danny Geralds, DO 04/02/2024 Active Self, Pharmacy Records  acyclovir  (ZOVIRAX ) 400 MG tablet [id number] No Take 400 mg by mouth 3 (three) times daily. [provider] 04/02/2024 Active Self,  Pharmacy Records  ferrous sulfate  325 (65 FE) MG tablet [id number] No Take 1 tablet (325 mg total) by mouth every other day.  Patient not taking: Reported on 04/06/2024   Kizzie Suzen SAUNDERS, CNM Not Taking Active Self, Pharmacy Records  ibuprofen  (ADVIL ) 600 MG tablet [id number] No Take 1 tablet (600 mg total) by mouth every 6 (six) hours as needed for moderate pain (pain score 4-6). Danny Geralds, DO 04/03/2024 Active Self, Pharmacy Records  ibuprofen  (MOTRIN  IB) 200 MG tablet [id number]  Take 3 tablets (600 mg total) by mouth every 6 (six) hours for 6 days. Tammy Sor, PA-C  Active   oxyCODONE  (OXY IR/ROXICODONE ) 5 MG immediate release tablet [id number]  Take 1 tablet (5 mg total) by mouth every 6 (six) hours as needed. Tammy Sor, PA-C  Active   senna-docusate (SENOKOT-S) 8.6-50 MG tablet [id number] No Take 2 tablets by mouth at bedtime as needed for mild constipation or moderate constipation. Danny Geralds, DO 04/01/2024 Active Self, Pharmacy Records            Home Care and Equipment/Supplies: Were Home Health Services Ordered?: NA Any new equipment or medical supplies ordered?: NA  Functional Questionnaire: Do you need assistance with bathing/showering or dressing?: No Do you need assistance with meal preparation?: No Do you need assistance with eating?: No Do you have difficulty maintaining continence: No Do you need assistance with getting out of bed/getting out of a chair/moving?: No Do you have difficulty managing or taking your medications?: No  Follow up appointments reviewed: PCP Follow-up appointment confirmed?: Yes Date of PCP follow-up appointment?: 04/21/24 Follow-up Provider: Meade Gerlach FNP Specialist [facility] Follow-up appointment confirmed?: Yes Follow-Up Specialty Provider:: Surgeon Do you need transportation to your follow-up appointment?: No Do  you understand care options if your condition(s) worsen?: Yes-patient verbalized  understanding    SIGNATURE Charmaine Bloodgood, LPN [facility] Health Advisor  l [facility] You Are. We Are. [facility] Direct Dial  [phone]

## 2024-04-13 ENCOUNTER — Telehealth: Payer: Self-pay

## 2024-04-13 NOTE — Patient Instructions (Signed)
 Dominique Garza - I am sorry I was unable to reach you today for our scheduled appointment. I work with [place], Meade PEDLAR, FNP and am calling to support your healthcare needs. Please contact me at [phone] at your earliest convenience. I look forward to speaking with you soon.   Thank you,  Tillman Gardener, BSW [place]  [facility], [facility] Social Worker Direct Dial: [phone]  Fax: [phone] Website: [email or web address]

## 2024-04-21 ENCOUNTER — Telehealth: Admitting: Family Medicine

## 2024-04-21 VITALS — BP 138/86

## 2024-04-21 DIAGNOSIS — K8000 Calculus of gallbladder with acute cholecystitis without obstruction: Secondary | ICD-10-CM

## 2024-04-21 NOTE — Progress Notes (Signed)
 Virtual Visit via Video Note  I connected with Dominique Garza on 04/21/2024 at 11:00 AM EST by a video enabled telemedicine application and verified that I am speaking with the correct person using two identifiers.  Patient Location: Home Provider Location: Home Office  I discussed the limitations, risks, security, and privacy concerns of performing an evaluation and management service by video and the availability of in person appointments. I also discussed with the patient that there may be a patient responsible charge related to this service. The patient expressed understanding and agreed to proceed.  Subjective: PCP: Dominique Meade PEDLAR, FNP  Chief Complaint  Patient presents with   Hospitalization Follow-up   HPI The patient is seen today for hospital follow-up after being admitted for acute cholecystitis. She initially presented with two days of right upper quadrant pain, nausea, and vomiting. Imaging and labs were consistent with acute cholecystitis, and she underwent a laparoscopic cholecystectomy on 11/28 following surgical consultation. Since discharge, she reports healing well with no abdominal pain. She has stopped taking Motrin  and oxycodone , and her blood pressure has remained stable, with a recent reading of 138/86. Overall, she feels well with no current concerns.  ROS: Per HPI Current Medications[1]  Observations/Objective: Today's Vitals   04/21/24 1058  BP: 138/86   Physical Exam Patient is well-developed, well-nourished in no acute distress.  Resting comfortably at home.  Head is normocephalic, atraumatic.  No labored breathing.  Speech is clear and coherent with logical content.  Patient is alert and oriented at baseline.   Assessment and Plan: Calculus of gallbladder with acute cholecystitis without obstruction -     CBC with Differential/Platelet -     BMP8+EGFR  Labs and imaging studies reviewed Encouraged to continue treatment regimen as is and follow  up for worsening symptoms Pending labs Follow Up Instructions: No follow-ups on file.   I discussed the assessment and treatment plan with the patient. The patient was provided an opportunity to ask questions, and all were answered. The patient agreed with the plan and demonstrated an understanding of the instructions.   The patient was advised to call back or seek an in-person evaluation if the symptoms worsen or if the condition fails to improve as anticipated.  The above assessment and management plan was discussed with the patient. The patient verbalized understanding of and has agreed to the management plan.   Dominique Garza  Z Bacchus, FNP      [1]  Current Outpatient Medications:    acetaminophen  (TYLENOL ) 500 MG tablet, Take 2 tablets (1,000 mg total) by mouth every 6 (six) hours as needed for moderate pain (pain score 4-6). (Patient taking differently: Take 1,000 mg by mouth every 6 (six) hours as needed for moderate pain (pain score 4-6) or mild pain (pain score 1-3).), Disp: 30 tablet, Rfl: 0   acyclovir  (ZOVIRAX ) 400 MG tablet, Take 400 mg by mouth 3 (three) times daily., Disp: , Rfl:    ferrous sulfate  325 (65 FE) MG tablet, Take 1 tablet (325 mg total) by mouth every other day. (Patient not taking: Reported on 04/06/2024), Disp: 45 tablet, Rfl: 2   ibuprofen  (ADVIL ) 600 MG tablet, Take 1 tablet (600 mg total) by mouth every 6 (six) hours as needed for moderate pain (pain score 4-6)., Disp: 30 tablet, Rfl: 0   oxyCODONE  (OXY IR/ROXICODONE ) 5 MG immediate release tablet, Take 1 tablet (5 mg total) by mouth every 6 (six) hours as needed., Disp: 15 tablet, Rfl: 0   senna-docusate (  SENOKOT-S) 8.6-50 MG tablet, Take 2 tablets by mouth at bedtime as needed for mild constipation or moderate constipation., Disp: 30 tablet, Rfl: 0

## 2024-04-25 ENCOUNTER — Ambulatory Visit

## 2024-04-26 ENCOUNTER — Ambulatory Visit: Admitting: Women's Health

## 2024-04-27 ENCOUNTER — Ambulatory Visit: Payer: Self-pay | Admitting: Family Medicine

## 2024-04-27 LAB — BMP8+EGFR
BUN/Creatinine Ratio: 7 — ABNORMAL LOW (ref 9–23)
BUN: 6 mg/dL (ref 6–20)
CO2: 22 mmol/L (ref 20–29)
Calcium: 9.3 mg/dL (ref 8.7–10.2)
Chloride: 105 mmol/L (ref 96–106)
Creatinine, Ser: 0.84 mg/dL (ref 0.57–1.00)
Glucose: 79 mg/dL (ref 70–99)
Potassium: 3.6 mmol/L (ref 3.5–5.2)
Sodium: 143 mmol/L (ref 134–144)
eGFR: 92 mL/min/1.73 (ref >59)

## 2024-04-27 LAB — CBC WITH DIFFERENTIAL/PLATELET
Basophils Absolute: 0.1 x10E3/uL (ref 0.0–0.2)
Basos: 1 %
EOS (ABSOLUTE): 0.5 x10E3/uL — ABNORMAL HIGH (ref 0.0–0.4)
Eos: 7 %
Hematocrit: 35.5 % (ref 34.0–46.6)
Hemoglobin: 11.5 g/dL (ref 11.1–15.9)
Immature Grans (Abs): 0 x10E3/uL (ref 0.0–0.1)
Immature Granulocytes: 0 %
Lymphocytes Absolute: 3.1 x10E3/uL (ref 0.7–3.1)
Lymphs: 42 %
MCH: 30 pg (ref 26.6–33.0)
MCHC: 32.4 g/dL (ref 31.5–35.7)
MCV: 93 fL (ref 79–97)
Monocytes Absolute: 0.4 x10E3/uL (ref 0.1–0.9)
Monocytes: 6 %
Neutrophils Absolute: 3.3 x10E3/uL (ref 1.4–7.0)
Neutrophils: 44 %
Platelets: 216 x10E3/uL (ref 150–450)
RBC: 3.83 x10E6/uL (ref 3.77–5.28)
RDW: 13.1 % (ref 11.7–15.4)
WBC: 7.5 x10E3/uL (ref 3.4–10.8)

## 2024-04-27 NOTE — Addendum Note (Signed)
 Addended by: [name] on: 04/27/2024 11:36 AM   Modules accepted: Level of Service

## 2024-05-17 ENCOUNTER — Ambulatory Visit: Admitting: Advanced Practice Midwife

## 2024-05-17 ENCOUNTER — Encounter: Payer: Self-pay | Admitting: Advanced Practice Midwife

## 2024-05-17 DIAGNOSIS — Z1332 Encounter for screening for maternal depression: Secondary | ICD-10-CM | POA: Diagnosis not present

## 2024-05-17 DIAGNOSIS — O9122 Nonpurulent mastitis associated with the puerperium: Secondary | ICD-10-CM

## 2024-05-17 DIAGNOSIS — Z8759 Personal history of other complications of pregnancy, childbirth and the puerperium: Secondary | ICD-10-CM

## 2024-05-17 MED ORDER — IBUPROFEN 600 MG PO TABS: 600.0000 mg | ORAL_TABLET | Freq: Four times a day (QID) | ORAL | 1 refills | Status: AC | PRN

## 2024-05-17 MED ORDER — DICLOXACILLIN SODIUM 500 MG PO CAPS: 500.0000 mg | ORAL_CAPSULE | Freq: Four times a day (QID) | ORAL | 0 refills | Status: AC

## 2024-05-17 MED ORDER — ACETAMINOPHEN 500 MG PO TABS: 1000.0000 mg | ORAL_TABLET | Freq: Four times a day (QID) | ORAL | 1 refills | Status: AC | PRN

## 2024-05-17 NOTE — Patient Instructions (Signed)
 Use the website www.pospartum.net for helpful postpartum resources!  Also use Lact Med for safety of medications with breastfeeding.
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
## 2024-05-22 ENCOUNTER — Ambulatory Visit

## 2024-05-22 VITALS — BP 153/102

## 2024-05-22 DIAGNOSIS — Z0131 Encounter for examination of blood pressure with abnormal findings: Secondary | ICD-10-CM

## 2024-05-22 DIAGNOSIS — Z0130 Encounter for examination of blood pressure without abnormal findings: Secondary | ICD-10-CM

## 2024-05-22 MED ORDER — NIFEDIPINE ER OSMOTIC RELEASE 30 MG PO TB24: 30.0000 mg | ORAL_TABLET | Freq: Every day | ORAL | 1 refills | Status: AC

## 2024-05-22 NOTE — Progress Notes (Signed)
" ° °  NURSE VISIT- BLOOD PRESSURE CHECK  SUBJECTIVE:  Dominique Garza is a 39 y.o. [phone] female here for BP check. She is postpartum, delivery date 03/22/24    HYPERTENSION ROS:  Pregnant/postpartum:  Severe headaches that don't go away with tylenol /other medicines: No  Visual changes (seeing spots/double/blurred vision) No  Severe pain under right breast breast or in center of upper chest No  Severe nausea/vomiting No  Taking medicines as instructed not applicable  OBJECTIVE:  BP (!) 153/102 (BP Location: Left Arm) Comment: home  Appearance alert, well appearing, and in no distress.  ASSESSMENT: Postpartum  blood pressure check  PLAN: Discussed with Dominique Garza, CNM, [facility]   Recommendations: new prescription will be sent   Follow-up: 1 week take medication AT LEAST 2 hours before  Dominique Garza Dominique Garza  05/22/2024 4:02 PM  "

## 2024-05-22 NOTE — Addendum Note (Signed)
 Addended by: KIZZIE SUZEN SAUNDERS on: 05/22/2024 04:53 PM   Modules accepted: Orders

## 2024-05-29 ENCOUNTER — Ambulatory Visit

## 2024-05-30 ENCOUNTER — Ambulatory Visit

## 2024-06-01 ENCOUNTER — Ambulatory Visit

## 2024-06-16 ENCOUNTER — Ambulatory Visit: Payer: Self-pay

## 2024-10-11 ENCOUNTER — Ambulatory Visit: Payer: Self-pay | Admitting: Nurse Practitioner
